# Patient Record
Sex: Female | Born: 1935 | Race: Black or African American | Hispanic: No | Smoking: Former smoker
Health system: Southern US, Community
[De-identification: ages and names within clinical notes are randomized; demographics above are authoritative.]

## PROBLEM LIST (undated history)

## (undated) DIAGNOSIS — E119 Type 2 diabetes mellitus without complications: Secondary | ICD-10-CM

## (undated) DIAGNOSIS — R0602 Shortness of breath: Secondary | ICD-10-CM

## (undated) DIAGNOSIS — Z9981 Dependence on supplemental oxygen: Secondary | ICD-10-CM

## (undated) DIAGNOSIS — F419 Anxiety disorder, unspecified: Secondary | ICD-10-CM

## (undated) DIAGNOSIS — C3412 Malignant neoplasm of upper lobe, left bronchus or lung: Secondary | ICD-10-CM

## (undated) DIAGNOSIS — J449 Chronic obstructive pulmonary disease, unspecified: Secondary | ICD-10-CM

## (undated) DIAGNOSIS — R7611 Nonspecific reaction to tuberculin skin test without active tuberculosis: Secondary | ICD-10-CM

## (undated) DIAGNOSIS — I10 Essential (primary) hypertension: Secondary | ICD-10-CM

## (undated) DIAGNOSIS — M1990 Unspecified osteoarthritis, unspecified site: Secondary | ICD-10-CM

## (undated) DIAGNOSIS — C801 Malignant (primary) neoplasm, unspecified: Secondary | ICD-10-CM

## (undated) DIAGNOSIS — J439 Emphysema, unspecified: Secondary | ICD-10-CM

## (undated) DIAGNOSIS — R002 Palpitations: Secondary | ICD-10-CM

## (undated) DIAGNOSIS — R0600 Dyspnea, unspecified: Secondary | ICD-10-CM

## (undated) DIAGNOSIS — K219 Gastro-esophageal reflux disease without esophagitis: Secondary | ICD-10-CM

## (undated) DIAGNOSIS — J309 Allergic rhinitis, unspecified: Secondary | ICD-10-CM

## (undated) HISTORY — DX: Essential (primary) hypertension: I10

## (undated) HISTORY — DX: Nonspecific reaction to tuberculin skin test without active tuberculosis: R76.11

## (undated) HISTORY — DX: Malignant neoplasm of upper lobe, left bronchus or lung: C34.12

## (undated) HISTORY — PX: CATARACT EXTRACTION W/ INTRAOCULAR LENS  IMPLANT, BILATERAL: SHX1307

## (undated) HISTORY — DX: Palpitations: R00.2

## (undated) HISTORY — DX: Allergic rhinitis, unspecified: J30.9

## (undated) HISTORY — DX: Chronic obstructive pulmonary disease, unspecified: J44.9

## (undated) HISTORY — DX: Shortness of breath: R06.02

## (undated) HISTORY — DX: Emphysema, unspecified: J43.9

---

## 1964-02-11 HISTORY — PX: VAGINAL HYSTERECTOMY: SHX2639

## 2009-01-15 ENCOUNTER — Emergency Department (HOSPITAL_COMMUNITY): Admission: EM | Admit: 2009-01-15 | Discharge: 2009-01-15 | Payer: Self-pay | Admitting: Emergency Medicine

## 2012-01-21 ENCOUNTER — Ambulatory Visit (HOSPITAL_COMMUNITY): Payer: Medicare Other | Attending: Cardiovascular Disease | Admitting: Radiology

## 2012-01-21 ENCOUNTER — Encounter: Payer: Medicare Other | Admitting: Cardiovascular Disease

## 2012-01-21 ENCOUNTER — Other Ambulatory Visit: Payer: Self-pay

## 2012-01-21 ENCOUNTER — Encounter: Payer: Self-pay | Admitting: *Deleted

## 2012-01-21 DIAGNOSIS — J4489 Other specified chronic obstructive pulmonary disease: Secondary | ICD-10-CM | POA: Insufficient documentation

## 2012-01-21 DIAGNOSIS — J449 Chronic obstructive pulmonary disease, unspecified: Secondary | ICD-10-CM | POA: Insufficient documentation

## 2012-01-21 DIAGNOSIS — I10 Essential (primary) hypertension: Secondary | ICD-10-CM | POA: Insufficient documentation

## 2012-01-21 DIAGNOSIS — Z8249 Family history of ischemic heart disease and other diseases of the circulatory system: Secondary | ICD-10-CM | POA: Insufficient documentation

## 2012-01-21 DIAGNOSIS — E669 Obesity, unspecified: Secondary | ICD-10-CM | POA: Insufficient documentation

## 2012-01-21 DIAGNOSIS — I517 Cardiomegaly: Secondary | ICD-10-CM

## 2012-01-21 DIAGNOSIS — E119 Type 2 diabetes mellitus without complications: Secondary | ICD-10-CM | POA: Insufficient documentation

## 2012-01-21 DIAGNOSIS — R002 Palpitations: Secondary | ICD-10-CM | POA: Insufficient documentation

## 2012-01-21 DIAGNOSIS — E785 Hyperlipidemia, unspecified: Secondary | ICD-10-CM | POA: Insufficient documentation

## 2012-01-21 DIAGNOSIS — F17200 Nicotine dependence, unspecified, uncomplicated: Secondary | ICD-10-CM | POA: Insufficient documentation

## 2012-01-21 NOTE — Progress Notes (Signed)
Echocardiogram performed.  

## 2012-01-21 NOTE — Progress Notes (Signed)
Patient ID: [name] [name], female   DOB: 07-Aug-1933, 78 y.o.   MRN: [id number] 78 yo referred by [facility].  Has HTN, DM elevated lipids intolerant to lipitor and COPD.  Increasing palpitations.  Already on both xanax and metoprolol.  ECG done at primary  11/23 NSR rate 73 normal.  Labs reivewed  And normal including Hct 38.6 Ambulatory monitor showed no arrhythmia  Some PAC;s wandering atrial pacer.  Maximum HR 119 mean HR 85 minimum HR 71.  ROS: Denies fever, malais, weight loss, blurry vision, decreased visual acuity, cough, sputum, SOB, hemoptysis, pleuritic pain, palpitaitons, heartburn, abdominal pain, melena, lower extremity edema, claudication, or rash.  All other systems reviewed and negative   General: Affect appropriate Healthy:  appears stated age HEENT: normal Neck supple with no adenopathy JVP normal no bruits no thyromegaly Lungs clear with no wheezing and good diaphragmatic motion Heart:  S1/S2 no murmur,rub, gallop or click PMI normal Abdomen: benighn, BS positve, no tenderness, no AAA no bruit.  No HSM or HJR Distal pulses intact with no bruits No edema Neuro non-focal Skin warm and dry No muscular weakness  Medications No current outpatient prescriptions on file.    Allergies Review of patient's allergies indicates not on file.  Family History: No family history on file.  Social History: History   Social History  . Marital Status: Single    Spouse Name: N/A    Number of Children: N/A  . Years of Education: N/A   Occupational History  . Not on file.   Social History Main Topics  . Smoking status: Not on file  . Smokeless tobacco: Not on file  . Alcohol Use: Not on file  . Drug Use: Not on file  . Sexually Active: Not on file   Other Topics Concern  . Not on file   Social History Narrative  . No narrative on file    Electrocardiogram:  11/23  SR rate 71 normal ECG  Assessment and Plan

## 2012-06-07 ENCOUNTER — Ambulatory Visit: Payer: Self-pay | Admitting: Family Medicine

## 2012-06-14 ENCOUNTER — Encounter: Payer: Self-pay | Admitting: Family Medicine

## 2012-06-14 ENCOUNTER — Ambulatory Visit (INDEPENDENT_AMBULATORY_CARE_PROVIDER_SITE_OTHER): Payer: Medicare Other | Admitting: Family Medicine

## 2012-06-14 VITALS — BP 135/94 | HR 71 | Temp 98.3°F | Resp 18 | Wt 203.0 lb

## 2012-06-14 DIAGNOSIS — M5430 Sciatica, unspecified side: Secondary | ICD-10-CM

## 2012-06-14 DIAGNOSIS — M7071 Other bursitis of hip, right hip: Secondary | ICD-10-CM

## 2012-06-14 DIAGNOSIS — M76899 Other specified enthesopathies of unspecified lower limb, excluding foot: Secondary | ICD-10-CM

## 2012-06-14 DIAGNOSIS — M5431 Sciatica, right side: Secondary | ICD-10-CM

## 2012-06-14 NOTE — Progress Notes (Signed)
  Subjective:    Patient ID: [name] [name], female    DOB: 07-09-1933, 79 y.o.   MRN: [id number]  HPI  Patient continued to have pain in her lower back that radiates into the posterior aspect of her right hip down the lateral aspect of her right thigh.  She reports numbness and weakness in her right thigh. She reports weakness in the thigh with walking. She reports severe pain in her lower back and posterior aspect of her right hip with prolonged standing and walking. There is no pain with range of motion of her hip. There is no crepitus in her hip. She does have tenderness to palpation over the right greater trochanteric bursa.  Pain improved 50% on neurontin 300 mg 3 times a day. Past Medical History  Diagnosis Date  . Palpitations   . SOB (shortness of breath)    No current outpatient prescriptions on file prior to visit.   No current facility-administered medications on file prior to visit.   Allergies  Allergen Reactions  . Darvon (Propoxyphene Hcl)   . Lipitor (Atorvastatin)     myalgia's     Review of Systems Remainder of review of systems is neg.    Objective:   Physical Exam  Cardiovascular: Normal rate, regular rhythm and normal heart sounds.   Pulmonary/Chest: Effort normal and breath sounds normal. No respiratory distress. She has no wheezes.  Musculoskeletal:       Right hip: She exhibits tenderness (over Greater trocanter). She exhibits normal range of motion, normal strength, no swelling and no crepitus.   pulses strength is 5 over 5 equal and symmetric in both legs. She is a negative straight leg raise today.        Assessment & Plan:  1. Right sided sciatica Continue Neurontin 300 mg 3 times a day. Obtain MRI of the lumbar spine to evaluate for possible causes of her right-sided neuropathy. - MR Lumbar Spine Wo Contrast; Future  2. Bursitis of right hip Evaluate #1 first, consider cortisone injection if no better.

## 2012-06-19 ENCOUNTER — Other Ambulatory Visit: Payer: Medicare Other

## 2012-06-21 ENCOUNTER — Inpatient Hospital Stay: Admission: RE | Admit: 2012-06-21 | Payer: Medicare Other | Source: Ambulatory Visit

## 2012-07-06 ENCOUNTER — Other Ambulatory Visit: Payer: Medicare Other

## 2012-07-09 ENCOUNTER — Inpatient Hospital Stay: Admission: RE | Admit: 2012-07-09 | Payer: Medicare Other | Source: Ambulatory Visit

## 2012-07-15 ENCOUNTER — Inpatient Hospital Stay: Admission: RE | Admit: 2012-07-15 | Payer: Medicare Other | Source: Ambulatory Visit

## 2012-07-26 ENCOUNTER — Other Ambulatory Visit: Payer: Medicare Other

## 2014-03-22 ENCOUNTER — Encounter: Payer: Self-pay | Admitting: Internal Medicine

## 2014-03-22 ENCOUNTER — Ambulatory Visit (INDEPENDENT_AMBULATORY_CARE_PROVIDER_SITE_OTHER): Payer: Medicare HMO | Admitting: Internal Medicine

## 2014-03-22 ENCOUNTER — Encounter (INDEPENDENT_AMBULATORY_CARE_PROVIDER_SITE_OTHER): Payer: Self-pay

## 2014-03-22 VITALS — BP 118/82 | HR 78 | Temp 98.0°F | Ht 62.0 in | Wt 180.8 lb

## 2014-03-22 DIAGNOSIS — J449 Chronic obstructive pulmonary disease, unspecified: Secondary | ICD-10-CM

## 2014-03-22 DIAGNOSIS — F17210 Nicotine dependence, cigarettes, uncomplicated: Secondary | ICD-10-CM | POA: Diagnosis not present

## 2014-03-22 DIAGNOSIS — R918 Other nonspecific abnormal finding of lung field: Secondary | ICD-10-CM | POA: Diagnosis not present

## 2014-03-22 DIAGNOSIS — R079 Chest pain, unspecified: Secondary | ICD-10-CM | POA: Insufficient documentation

## 2014-03-22 NOTE — Progress Notes (Signed)
   Subjective:    Patient ID: [name] [name], female    DOB: Aug 23, 1933,     MRN: [id number]  HPI  63 yowf active smoker referred by Dr Einar Gip for eval of lung nodule to pulmonary clinic and first seen 03/22/2014 with GOLD I criteria copd    03/22/2014 1st [facility] office visit/ [name] [name]   Chief Complaint  Patient presents with  . PULMONARY CONSULT    Referred by Dr Einar Gip for abn CT- possible malignancy.   breathing some better but first noticed maybe 10 years indolent onset progressive mild doe / R leg x 50 ft no longer does shopping unless riding cart New cough x nov 2015 with lots of white mucus, better p abx and prednisone but mucus never yellow/ green or bloody On no resp rx at all  No obvious other patterns in day to day or daytime variabilty or assoc  cp or chest tightness, subjective wheeze overt sinus or hb symptoms. No unusual exp hx or h/o childhood pna/ asthma or knowledge of premature birth.  Sleeping ok without nocturnal  or early am exacerbation  of respiratory  c/o's or need for noct saba. Also denies any obvious fluctuation of symptoms with weather or environmental changes or other aggravating or alleviating factors except as outlined above   Current Medications, Allergies, Complete Past Medical History, Past Surgical History, Family History, and Social History were reviewed in [employer] record.             Review of Systems  Constitutional: Negative for fever and unexpected weight change.  HENT: Positive for congestion, postnasal drip, rhinorrhea and sinus pressure. Negative for dental problem, ear pain, nosebleeds, sneezing, sore throat and trouble swallowing.   Eyes: Negative for redness and itching.  Respiratory: Positive for cough, shortness of breath and wheezing. Negative for chest tightness.   Cardiovascular: Positive for palpitations. Negative for leg swelling.       Poor circulation  Gastrointestinal: Negative for nausea and  vomiting.  Genitourinary: Negative for dysuria.  Musculoskeletal: Negative for joint swelling.  Skin: Negative for rash.  Neurological: Negative for headaches.  Hematological: Bruises/bleeds easily.  Psychiatric/Behavioral: Negative for dysphoric mood. The patient is nervous/anxious.        Objective:   Physical Exam  Obese bf nad   Wt Readings from Last 3 Encounters:  03/22/14 180 lb 12.8 oz (82.01 kg)  06/14/12 203 lb (92.08 kg)    Vital signs reviewed  HEENT: nl dentition, turbinates, and orophanx. Nl external ear canals without cough reflex   NECK :  without JVD/Nodes/TM/ nl carotid upstrokes bilaterally   LUNGS: no acc muscle use, clear to A and P bilaterally without cough on insp or exp maneuvers   CV:  RRR  no s3 or murmur or increase in P2, no edema   ABD:  soft and nontender with nl excursion in the supine position. No bruits or organomegaly, bowel sounds nl  MS:  warm without deformities, calf tenderness, cyanosis or clubbing  SKIN: warm and dry without lesions    NEURO:  alert, approp, no deficits        CT chest 02/24/14 reviewed: LUL dense pleural based mass       Assessment & Plan:

## 2014-03-22 NOTE — Patient Instructions (Addendum)
Please see patient coordinator before you leave today  to schedule PET scan and follow up with Dr Roxan Hockey a day or so later   No smoking if at all possible between now and surgery - it's the most important aspect of your care!

## 2014-03-23 DIAGNOSIS — F17210 Nicotine dependence, cigarettes, uncomplicated: Secondary | ICD-10-CM | POA: Insufficient documentation

## 2014-03-23 NOTE — Assessment & Plan Note (Signed)

## 2014-03-23 NOTE — Assessment & Plan Note (Signed)
CT 02/24/14 c/w resectable LUL tumor  Discussed in detail all the  indications, usual  risks and alternatives  relative to the benefits with patient who agrees to proceed with PET first and if resectable for cure rec T surgery eval rather than attempt IR bx first

## 2014-03-23 NOTE — Assessment & Plan Note (Signed)
-   spirometry 03/22/2014  FEV1  1.57 (102%) ratio 57%     I reviewed the Flethcher curve with patient that basically indicates  if you quit smoking when your best day FEV1 is still well preserved (as is the case here)  it is highly unlikely you will progress to severe disease and informed the patient there was no medication on the market that has proven to change the curve or the likelihood of progression.  Therefore stopping smoking and maintaining abstinence is the most important aspect of care, not choice of inhalers or for that matter, doctors.  No need for resp rx at this point other than to stop smoking

## 2014-03-29 ENCOUNTER — Encounter (HOSPITAL_COMMUNITY)
Admission: RE | Admit: 2014-03-29 | Discharge: 2014-03-29 | Disposition: A | Discharge status: Home / Self Care | Payer: Medicare HMO | Source: Ambulatory Visit | Attending: Internal Medicine | Admitting: Internal Medicine

## 2014-03-29 DIAGNOSIS — R918 Other nonspecific abnormal finding of lung field: Secondary | ICD-10-CM | POA: Diagnosis not present

## 2014-03-29 LAB — GLUCOSE, CAPILLARY: Glucose-Capillary: 103 mg/dL — ABNORMAL HIGH (ref 70–99)

## 2014-03-29 MED ORDER — FLUDEOXYGLUCOSE F - 18 (FDG) INJECTION
9.7000 | Freq: Once | INTRAVENOUS | Status: AC | PRN
  Administered 2014-03-29: 9.7 via INTRAVENOUS

## 2014-03-30 ENCOUNTER — Telehealth: Payer: Self-pay | Admitting: Internal Medicine

## 2014-03-30 NOTE — Telephone Encounter (Signed)
Called and spoke with pt and about her PET scan results per MW>  Pt requesting that MW call her directly since she has questions to ask.  MW please advise. thanks

## 2014-03-31 ENCOUNTER — Encounter: Payer: Self-pay | Admitting: Thoracic Surgery (Cardiothoracic Vascular Surgery)

## 2014-03-31 ENCOUNTER — Other Ambulatory Visit: Payer: Self-pay | Admitting: *Deleted

## 2014-03-31 ENCOUNTER — Institutional Professional Consult (permissible substitution) (INDEPENDENT_AMBULATORY_CARE_PROVIDER_SITE_OTHER): Payer: Medicare HMO | Admitting: Thoracic Surgery (Cardiothoracic Vascular Surgery)

## 2014-03-31 VITALS — BP 158/90 | HR 107 | Resp 16 | Ht 62.5 in | Wt 181.0 lb

## 2014-03-31 DIAGNOSIS — R918 Other nonspecific abnormal finding of lung field: Secondary | ICD-10-CM

## 2014-03-31 DIAGNOSIS — E119 Type 2 diabetes mellitus without complications: Secondary | ICD-10-CM | POA: Insufficient documentation

## 2014-03-31 DIAGNOSIS — I739 Peripheral vascular disease, unspecified: Secondary | ICD-10-CM

## 2014-03-31 DIAGNOSIS — R0989 Other specified symptoms and signs involving the circulatory and respiratory systems: Secondary | ICD-10-CM

## 2014-03-31 DIAGNOSIS — J432 Centrilobular emphysema: Secondary | ICD-10-CM

## 2014-03-31 NOTE — Progress Notes (Signed)
PCP is No PCP Per Patient Referring Provider is Tanda Rockers, MD  Chief Complaint  Patient presents with  . Lung Mass    LULobe per PET    HPI: [name] [name] is a 81 year old woman who is sent for consultation regarding a left upper lobe mass.  [name] [name] is a 81 year old woman who has been in generally good health most of her life. She is a very poor historian. She has a long history of tobacco abuse having started smoking at age 23. She smoked just less than a pack a day for her adult life. She still smokes less than half a pack of cigarettes daily.  She recently saw Dr. Einar Gip apparently for workup of shortness of breath with exertion. A chest x-ray showed a lung mass. That led to a CT scan which showed a 4 cm left upper lobe mass as well as extensive emphysematous changes in the upper lobes bilaterally. There is no obvious adenopathy.  She was referred to Dr. Melvyn Novas. He recommended a PET/CT. The primary mass was markedly hypermetabolic with an SUV of 27. It was adjacent to the chest wall but no definite evidence of invasion. There was no evidence of nodal involvement or distant metastases.  She says that she was having a lot of breathing issues back in November. She had a cough and was treated with antibiotics. She says that she now does not short of breath with walking. She walks up a flight of stairs to get into her apartment. She says that her activities are mostly limited by right hip pain and back pain. She says that Dr. Einar Gip found that she has peripheral arterial disease.  She is on metformin. She says that she has been told that she has prediabetes. She denies chest pain, pressure or tightness when asked directly about them. She has had a cough recently. She has not had any hemoptysis. She says her appetite is been poor and she's lost about 20 pounds over the past 3 months. She's not had any unusual headaches. She recently had cataract surgery and has had some blurred vision since. She  denies wheezing and says she has not had to use any inhalers.  Zubrod Score: At the time of surgery this patient's most appropriate activity status/level should be described as: []     0    Normal activity, no symptoms []     1    Restricted in physical strenuous activity but ambulatory, able to do out light work [x]     2    Ambulatory and capable of self care, unable to do work activities, up and about >50 % of waking hours                              []     3    Only limited self care, in bed greater than 50% of waking hours []     4    Completely disabled, no self care, confined to bed or chair []     5    Moribund   Past Medical History  Diagnosis Date  . Palpitations   . SOB (shortness of breath)   . COPD (chronic obstructive pulmonary disease)   . HTN (hypertension)    diabetes mellitus type 2  Past Surgical History  Procedure Laterality Date  . Vaginal hysterectomy  1966    Family History  Problem Relation Age of Onset  . Cervical cancer Mother   .  Prostate cancer Father   . Stroke Maternal Grandmother   . Angina Maternal Grandfather   . Prostate cancer Paternal Uncle     Social History History  Substance Use Topics  . Smoking status: Current Every Day Smoker -- 0.75 packs/day for 69 years    Types: Cigarettes  . Smokeless tobacco: Not on file     Comment: Pt is trying to quit  . Alcohol Use: No    Current Outpatient Prescriptions  Medication Sig Dispense Refill  . ALPRAZolam (XANAX) 0.5 MG tablet Take 0.5 mg by mouth 3 (three) times daily as needed for anxiety.    Marland Kitchen aspirin 81 MG tablet Take 81 mg by mouth daily.    Marland Kitchen atorvastatin (LIPITOR) 10 MG tablet Take 10 mg by mouth daily.    Marland Kitchen buPROPion (WELLBUTRIN SR) 150 MG 12 hr tablet Take 150 mg by mouth every 12 (twelve) hours.    . metFORMIN (GLUCOPHAGE) 500 MG tablet Take 500 mg by mouth 2 (two) times daily with a meal.    . metoprolol succinate (TOPROL-XL) 50 MG 24 hr tablet Take 50 mg by mouth 2 (two) times  daily. Take with or immediately following a meal.    . valsartan-hydrochlorothiazide (DIOVAN-HCT) 160-25 MG per tablet Take 1 tablet by mouth daily.     No current facility-administered medications for this visit.    Allergies  Allergen Reactions  . Darvon [Propoxyphene Hcl]   . Lipitor [Atorvastatin]     myalgia's    Review of Systems  Constitutional: Positive for activity change (very limited- hip and leg pain), appetite change and unexpected weight change (lost 20 pounds in 3 months).  Eyes: Positive for visual disturbance (recent cataract surgery).  Respiratory: Positive for shortness of breath (With exertion, although exertion more of the limited by leg pain).   Cardiovascular: Negative for chest pain.       Poor circulation  Musculoskeletal: Positive for back pain and arthralgias.       Leg cramps  Neurological: Negative.   Hematological: Bruises/bleeds easily.  Psychiatric/Behavioral: The patient is nervous/anxious.   All other systems reviewed and are negative.   BP 158/90 mmHg  Pulse 107  Resp 16  Ht 5' 2.5" (1.588 m)  Wt 181 lb (82.101 kg)  BMI 32.56 kg/m2  SpO2 97% Physical Exam  Constitutional: She is oriented to person, place, and time. No distress.  Obese  HENT:  Head: Normocephalic and atraumatic.  Eyes: EOM are normal. Pupils are equal, round, and reactive to light.  Neck: Normal range of motion. Neck supple. No thyromegaly present.  Bilateral carotid bruits left greater than right  Cardiovascular: Normal rate, regular rhythm and normal heart sounds.   No murmur heard. Pulmonary/Chest: Effort normal. She has no wheezes.  Diminished breath sounds bilaterally  Abdominal: Soft. There is no tenderness.  Musculoskeletal: She exhibits no edema.  Lymphadenopathy:    She has no cervical adenopathy.  Neurological: She is alert and oriented to person, place, and time. No cranial nerve deficit.  No focal motor deficits  Skin: Skin is warm and dry.  Vitals  reviewed.    Diagnostic Tests: NUCLEAR MEDICINE PET SKULL BASE TO THIGH  TECHNIQUE: 9.7 mCi F-18 FDG was injected intravenously. Full-ring PET imaging was performed from the skull base to thigh after the radiotracer. CT data was obtained and used for attenuation correction and anatomic localization.  FASTING BLOOD GLUCOSE: Value: 103 mg/dl  COMPARISON: None.  FINDINGS: NECK  No hypermetabolic lymph nodes in the neck.  CHEST  Hypermetabolic mass in the left upper lobe measures 4.5 x 3.9 cm. Max abuts the pleural surface. This left upper lobe mass is intensely metabolic with SUV max equal 26.7.  No no hypermetabolic mediastinal lymph nodes. AP window node measuring 9 mm does not have metabolic activity above background.  In the right upper lobe, small nodule along the pleural surface measures 7 mm (image 49, series 4). No clear associated metabolic activity.  ABDOMEN/PELVIS  No abnormal hypermetabolic activity within the liver, pancreas, adrenal glands, or spleen. No hypermetabolic lymph nodes in the abdomen or pelvis. Severe atherosclerotic calcification abdominal aorta. Diverticulosis of the colon without diverticulitis.  SKELETON  No focal hypermetabolic activity to suggest skeletal metastasis.  IMPRESSION: 1. Hypermetabolic left upper lobe mass abutting the pleural surface. 2. No clear hypermetabolic mediastinal lymph nodes. 3. Staging by FDG PET imaging T2a N0 M0 4. Subpleural nodule in the right upper lobe measures 7 mm. Recommend attention on follow-up. 5. Extensive centrilobular emphysema.   Electronically Signed  By: Suzy Bouchard M.D.  On: 03/29/2014 16:03  I personally reviewed the CT from [facility] and the PET CT. My recommendations are based on my interpretation.  Pulmonary function tests FVC 2.77 (132%) FEV1 1.57 (102%) FEV1/FVC 57% Vital Capacity 2.09  Impression: 81 year old woman with a long history of  tobacco abuse who has a 4 cm spiculated nodule in the left upper lobe that is highly hypermetabolic on PET/CT. This is consistent with a primary bronchogenic carcinoma. There is no evidence of regional or distant metastases. Lesion is adjacent to the chest wall, but there is no radiologic evidence of chest wall invasion.  I had a long discussion with [name] [name] and her daughter. I reviewed the CT and PET/CT findings with them. We discussed the differential diagnosis and options for diagnosis and treatment. They understand that this has to be considered a lung cancer was secured be proven otherwise. They understand that this is extremely unlikely to be anything else. We discussed potential treatment options such as surgery, chemotherapy, radiation, and combinations.  It was very difficult to get an accurate assessment of her exercise capacity and breathing capacity. She has very significant emphysematous changes on her CT, but her pulmonary function testing was relatively unremarkable. From her history she sounded very deconditioned. I will did a walk around the office floor with her in going several hundred feet and she did not have any difficulty with shortness of breath. She did complain of some right hip pain.  I discussed possible surgical resection. They understand that this has the best chance for cure. They do understand this would be a major operation with significant risk for morbidity and mortality given her advanced age and some of her comorbid conditions. The proposed procedure with the left video-assisted thoracoscopy, left upper lobectomy, possible chest wall resection, possible cryo-analgesia of intercostal nerves. I described the general nature of the procedure to them including the incisions to be used, the need for general anesthesia, the expected hospital stay, and the overall recovery. She does understand that her breathing will be worse after the operation better. We reviewed the  indications, risks, benefits, and alternatives. They understand the risk include, but are not limited to death, MI, DVT, PE, bleeding, possible need for transfusion, infection, prolonged air leaks, cardiac arrhythmias, as well as the possibility of unforeseeable complications.  They wish to take some time and think over the options before making a decision. They will return in 2 weeks for further discussion. In  the meantime we will get an MRI of the brain to rule out metastases.  Plan:  1. MR brain- new lung mass, rule out metastases  2. Return in 2 weeks for further discussion. If she decides to proceed before that time she can call and we'll get the operation scheduled.  Revonda Standard Roxan Hockey, MD [facility] [phone]

## 2014-04-11 LAB — BUN: BUN: 13 mg/dL (ref 6–23)

## 2014-04-11 LAB — CREATININE, SERUM: CREATININE: 1 mg/dL (ref 0.50–1.10)

## 2014-04-17 ENCOUNTER — Ambulatory Visit
Admission: RE | Admit: 2014-04-17 | Discharge: 2014-04-17 | Disposition: A | Discharge status: Home / Self Care | Payer: Medicare HMO | Source: Ambulatory Visit | Attending: Thoracic Surgery (Cardiothoracic Vascular Surgery) | Admitting: Thoracic Surgery (Cardiothoracic Vascular Surgery)

## 2014-04-17 DIAGNOSIS — R918 Other nonspecific abnormal finding of lung field: Secondary | ICD-10-CM

## 2014-04-17 MED ORDER — GADOBENATE DIMEGLUMINE 529 MG/ML IV SOLN
17.0000 mL | Freq: Once | INTRAVENOUS | Status: AC | PRN
  Administered 2014-04-17: 17 mL via INTRAVENOUS

## 2014-04-18 ENCOUNTER — Encounter: Payer: Self-pay | Admitting: Thoracic Surgery (Cardiothoracic Vascular Surgery)

## 2014-04-18 ENCOUNTER — Other Ambulatory Visit: Payer: Self-pay | Admitting: *Deleted

## 2014-04-18 ENCOUNTER — Ambulatory Visit (INDEPENDENT_AMBULATORY_CARE_PROVIDER_SITE_OTHER): Payer: Medicare HMO | Admitting: Thoracic Surgery (Cardiothoracic Vascular Surgery)

## 2014-04-18 VITALS — BP 175/87 | HR 88 | Resp 20 | Ht 62.0 in | Wt 180.0 lb

## 2014-04-18 DIAGNOSIS — J984 Other disorders of lung: Secondary | ICD-10-CM

## 2014-04-18 DIAGNOSIS — R918 Other nonspecific abnormal finding of lung field: Secondary | ICD-10-CM

## 2014-04-18 DIAGNOSIS — J432 Centrilobular emphysema: Secondary | ICD-10-CM

## 2014-04-18 NOTE — Progress Notes (Signed)
HPI:  [name] [name] is a 81 year old woman with a left upper lobe mass was on the office 2 weeks ago. She was uncertain as to how she would like to proceed with treatment. She had a brain MR and now returns to discuss the results of that study as well as decide how to proceed with treatment of her lung mass.  She has been very anxious about her results.  She is a poor historian and complained of difficulty walking. It was unclear if this was due to hip pain or shortness of breath. I walked with her around the upper level of our office building to the elevators and back. She did not have any shortness of breath. She did complain of hip pain.   I reviewed her notes from Dr. Irven Shelling office. Her echocardiogram showed left ventricular hypertrophy with grade 1 diastolic dysfunction. Ejection fraction was 57%. There was trace tricuspid regurgitation, no evidence of pulmonary hypertension. ABIs were 0.45 on the right and 0.89 on the left. Her nuclear stress study was nondiagnostic for ischemia. There was prominent breast attenuation artifact in the inferior wall without demonstrable ischemia. This was interpreted as a low risk study  03/31/2014 [name] [name] is a 81 year old woman who has been in generally good health most of her life. She is a very poor historian. She has a long history of tobacco abuse having started smoking at age 44. She smoked just less than a pack a day for her adult life. She still smokes less than half a pack of cigarettes daily.  She recently saw Dr. Einar Gip apparently for workup of shortness of breath with exertion. A chest x-ray showed a lung mass. That led to a CT scan which showed a 4 cm left upper lobe mass as well as extensive emphysematous changes in the upper lobes bilaterally. There was no obvious adenopathy.  She was referred to Dr. Melvyn Novas. He recommended a PET/CT. The primary mass was markedly hypermetabolic with an SUV of 27. It was adjacent to the chest wall but no definite  evidence of invasion. There was no evidence of nodal involvement or distant metastases.  She says that she was having a lot of breathing issues back in November. She had a cough and was treated with antibiotics. She says that she now does not short of breath with walking. She walks up a flight of stairs to get into her apartment. She says that her activities are mostly limited by right hip pain and back pain. She says that Dr. Einar Gip found that she has peripheral arterial disease.  She is on metformin for "prediabetes". She denies chest pain, pressure or tightness when asked directly about them. She has had a cough recently. She has not had any hemoptysis. She says her appetite is been poor and she's lost about 20 pounds over the past 3 months. She's not had any unusual headaches. She recently had cataract surgery and has had some blurred vision since. She denies wheezing and says she has not had to use any inhalers.  Past Medical History  Diagnosis Date  . Palpitations   . SOB (shortness of breath)   . COPD (chronic obstructive pulmonary disease)   . HTN (hypertension)    Past Surgical History  Procedure Laterality Date  . Vaginal hysterectomy  1966    Current Outpatient Prescriptions  Medication Sig Dispense Refill  . ALPRAZolam (XANAX) 0.5 MG tablet Take 0.5 mg by mouth 3 (three) times daily as needed for anxiety.    Marland Kitchen  aspirin 81 MG tablet Take 81 mg by mouth daily.    Marland Kitchen atorvastatin (LIPITOR) 10 MG tablet Take 10 mg by mouth daily.    Marland Kitchen buPROPion (WELLBUTRIN SR) 150 MG 12 hr tablet Take 150 mg by mouth every 12 (twelve) hours.    . metFORMIN (GLUCOPHAGE) 500 MG tablet Take 500 mg by mouth 2 (two) times daily with a meal.    . metoprolol succinate (TOPROL-XL) 50 MG 24 hr tablet Take 50 mg by mouth 2 (two) times daily. Take with or immediately following a meal.    . valsartan-hydrochlorothiazide (DIOVAN-HCT) 160-25 MG per tablet Take 1 tablet by mouth daily.     No current  facility-administered medications for this visit.   Family History  Problem Relation Age of Onset  . Cervical cancer Mother   . Prostate cancer Father   . Stroke Maternal Grandmother   . Angina Maternal Grandfather   . Prostate cancer Paternal Uncle    History   Social History  . Marital Status: Single    Spouse Name: N/A  . Number of Children: N/A  . Years of Education: N/A   Occupational History  . retired    Social History Main Topics  . Smoking status: Current Every Day Smoker -- 0.75 packs/day for 69 years    Types: Cigarettes  . Smokeless tobacco: Not on file     Comment: Pt is trying to quit  . Alcohol Use: No  . Drug Use: No  . Sexual Activity: Not on file   Other Topics Concern  . Not on file   Social History Narrative     Physical Exam  BP 175/87 mmHg  Pulse 88  Resp 20  Ht 5\' 2"  (1.575 m)  Wt 180 lb (81.647 kg)  BMI 32.91 kg/m2  SpO2 96%  Constitutional: She is oriented to person, place, and time. No distress.  Obese  HENT:  Head: Normocephalic and atraumatic.  Eyes: EOM are normal. Pupils are equal, round, and reactive to light.  Neck: Normal range of motion. Neck supple. No thyromegaly present.  Bilateral carotid bruits left greater than right  Cardiovascular: Normal rate, regular rhythm and normal heart sounds.  No murmur heard. Pulmonary/Chest: Effort normal. She has no wheezes.  Diminished breath sounds bilaterally  Abdominal: Soft. There is no tenderness.  Musculoskeletal: She exhibits no edema.  Lymphadenopathy:   She has no cervical adenopathy.  Neurological: She is alert and oriented to person, place, and time. No cranial nerve deficit.  No focal motor deficits  Skin: Skin is warm and dry.  Vitals reviewed.    Diagnostic Tests: NUCLEAR MEDICINE PET SKULL BASE TO THIGH  TECHNIQUE: 9.7 mCi F-18 FDG was injected intravenously. Full-ring PET imaging was performed from the skull base to thigh after the radiotracer. CT data  was obtained and used for attenuation correction and anatomic localization.  FASTING BLOOD GLUCOSE: Value: 103 mg/dl  COMPARISON: None.  FINDINGS: NECK  No hypermetabolic lymph nodes in the neck.  CHEST  Hypermetabolic mass in the left upper lobe measures 4.5 x 3.9 cm. Max abuts the pleural surface. This left upper lobe mass is intensely metabolic with SUV max equal 26.7.  No no hypermetabolic mediastinal lymph nodes. AP window node measuring 9 mm does not have metabolic activity above background.  In the right upper lobe, small nodule along the pleural surface measures 7 mm (image 49, series 4). No clear associated metabolic activity.  ABDOMEN/PELVIS  No abnormal hypermetabolic activity within the liver, pancreas,  adrenal glands, or spleen. No hypermetabolic lymph nodes in the abdomen or pelvis. Severe atherosclerotic calcification abdominal aorta. Diverticulosis of the colon without diverticulitis.  SKELETON  No focal hypermetabolic activity to suggest skeletal metastasis.  IMPRESSION: 1. Hypermetabolic left upper lobe mass abutting the pleural surface. 2. No clear hypermetabolic mediastinal lymph nodes. 3. Staging by FDG PET imaging T2a N0 M0 4. Subpleural nodule in the right upper lobe measures 7 mm. Recommend attention on follow-up. 5. Extensive centrilobular emphysema.   Electronically Signed  By: Suzy Bouchard M.D.  On: 03/29/2014 16:03  I personally reviewed the CT from [facility] and the PET CT. My recommendations are based on my interpretation.  Pulmonary function tests FVC 2.77 (132%) FEV1 1.57 (102%) FEV1/FVC 57% Vital Capacity 2.09  I reviewed her notes from Dr. Irven Shelling office. Her echocardiogram showed left ventricular hypertrophy with grade 1 diastolic dysfunction. Ejection fraction was 57%. There was trace tricuspid regurgitation, no evidence of pulmonary hypertension. ABIs were 0.45 on the right and 0.89 on the left.  Her nuclear stress study was nondiagnostic for ischemia. There was prominent breast attenuation artifact in the inferior wall without demonstrable ischemia. This was interpreted as a low risk study  Impression: [name] [name] is a 81 year old smoker with COPD who has a 4 cm left upper lobe mass abutting the chest wall. This is highly suspicious for a primary bronchogenic carcinoma. It has to be considered a lung cancer unless it can be proven otherwise. My recommendation was to proceed with surgical resection. She is a relatively high risk patient given her age, obesity, peripheral arterial disease, diabetes, and COPD. We did discuss the alternative option of biopsy followed by chemotherapy and radiation. She does understand that depending on intraoperative findings we may recommend chemotherapy and/or radiation postoperatively.  She understands that surgery is not a guarantee of cure, but offers the best hope for that.  She was accompanied by her daughter, who was here at her last visit, and her son-in-law.  I once again reviewed the general nature of the proposed surgical procedure. We again discussed the incisions to be used, the need for general anesthesia, the expected hospital stay, and overall recovery. We reviewed the indications, risks, benefits, and alternatives. She understands the risks include, but are not limited to death, MI, DVT, PE, bleeding, possible need for transfusion, stroke, infection, air leak, cardiac arrhythmias, as well as the possibility of unforeseeable complications.  We also discussed the use of prior analgesia of intercostal nerves to help with postoperative pain. She does wish to have that done. She does understand that would depend on whether or not there is chest wall invasion.  She has decided to proceed with surgery.  Plan: Left VATS, left upper lobectomy, cryo-analgesia of intercostal nerves on Thursday, 05/04/2014  [name], MD [facility] [phone]

## 2014-05-02 ENCOUNTER — Ambulatory Visit (HOSPITAL_COMMUNITY)
Admission: RE | Admit: 2014-05-02 | Discharge: 2014-05-02 | Disposition: A | Discharge status: Home / Self Care | Payer: Medicare HMO | Source: Ambulatory Visit | Attending: Thoracic Surgery (Cardiothoracic Vascular Surgery) | Admitting: Thoracic Surgery (Cardiothoracic Vascular Surgery)

## 2014-05-02 ENCOUNTER — Encounter (HOSPITAL_COMMUNITY)
Admission: RE | Admit: 2014-05-02 | Discharge: 2014-05-02 | Disposition: A | Discharge status: Home / Self Care | Payer: Medicare HMO | Source: Ambulatory Visit | Attending: Thoracic Surgery (Cardiothoracic Vascular Surgery) | Admitting: Thoracic Surgery (Cardiothoracic Vascular Surgery)

## 2014-05-02 ENCOUNTER — Encounter (HOSPITAL_COMMUNITY): Payer: Self-pay

## 2014-05-02 VITALS — BP 141/69 | HR 77 | Temp 98.6°F | Resp 20

## 2014-05-02 DIAGNOSIS — Z720 Tobacco use: Secondary | ICD-10-CM | POA: Insufficient documentation

## 2014-05-02 DIAGNOSIS — R05 Cough: Secondary | ICD-10-CM

## 2014-05-02 DIAGNOSIS — R918 Other nonspecific abnormal finding of lung field: Secondary | ICD-10-CM | POA: Insufficient documentation

## 2014-05-02 DIAGNOSIS — Z01818 Encounter for other preprocedural examination: Secondary | ICD-10-CM

## 2014-05-02 DIAGNOSIS — J984 Other disorders of lung: Secondary | ICD-10-CM

## 2014-05-02 HISTORY — DX: Unspecified osteoarthritis, unspecified site: M19.90

## 2014-05-02 HISTORY — DX: Anxiety disorder, unspecified: F41.9

## 2014-05-02 HISTORY — DX: Type 2 diabetes mellitus without complications: E11.9

## 2014-05-02 HISTORY — DX: Malignant (primary) neoplasm, unspecified: C80.1

## 2014-05-02 LAB — BLOOD GAS, ARTERIAL
Acid-base deficit: 1.3 mmol/L (ref 0.0–2.0)
Bicarbonate: 22.5 mEq/L (ref 20.0–24.0)
DRAWN BY: 206361
FIO2: 0.21 %
O2 Saturation: 93.2 %
PATIENT TEMPERATURE: 98.6
PCO2 ART: 35.2 mmHg (ref 35.0–45.0)
PH ART: 7.421 (ref 7.350–7.450)
TCO2: 23.6 mmol/L (ref 0–100)
pO2, Arterial: 68.8 mmHg — ABNORMAL LOW (ref 80.0–100.0)

## 2014-05-02 LAB — COMPREHENSIVE METABOLIC PANEL
ALK PHOS: 89 U/L (ref 39–117)
ALT: 13 U/L (ref 0–35)
ANION GAP: 10 (ref 5–15)
AST: 16 U/L (ref 0–37)
Albumin: 3.2 g/dL — ABNORMAL LOW (ref 3.5–5.2)
BILIRUBIN TOTAL: 0.4 mg/dL (ref 0.3–1.2)
BUN: 11 mg/dL (ref 6–23)
CHLORIDE: 108 mmol/L (ref 96–112)
CO2: 21 mmol/L (ref 19–32)
Calcium: 9.7 mg/dL (ref 8.4–10.5)
Creatinine, Ser: 0.8 mg/dL (ref 0.50–1.10)
GFR calc Af Amer: 80 mL/min — ABNORMAL LOW (ref >90)
GFR calc non Af Amer: 69 mL/min — ABNORMAL LOW (ref >90)
Glucose, Bld: 99 mg/dL (ref 70–99)
Potassium: 4.3 mmol/L (ref 3.5–5.1)
Sodium: 139 mmol/L (ref 135–145)
Total Protein: 7.2 g/dL (ref 6.0–8.3)

## 2014-05-02 LAB — URINALYSIS, ROUTINE W REFLEX MICROSCOPIC
BILIRUBIN URINE: NEGATIVE
Glucose, UA: NEGATIVE mg/dL
HGB URINE DIPSTICK: NEGATIVE
KETONES UR: NEGATIVE mg/dL
Nitrite: NEGATIVE
PROTEIN: NEGATIVE mg/dL
Specific Gravity, Urine: 1.011 (ref 1.005–1.030)
UROBILINOGEN UA: 0.2 mg/dL (ref 0.0–1.0)
pH: 5.5 (ref 5.0–8.0)

## 2014-05-02 LAB — ABO/RH: ABO/RH(D): O POS

## 2014-05-02 LAB — CBC
HCT: 35.2 % — ABNORMAL LOW (ref 36.0–46.0)
HEMOGLOBIN: 11.8 g/dL — AB (ref 12.0–15.0)
MCH: 27.8 pg (ref 26.0–34.0)
MCHC: 33.5 g/dL (ref 30.0–36.0)
MCV: 83 fL (ref 78.0–100.0)
PLATELETS: 375 10*3/uL (ref 150–400)
RBC: 4.24 MIL/uL (ref 3.87–5.11)
RDW: 14 % (ref 11.5–15.5)
WBC: 12.2 10*3/uL — ABNORMAL HIGH (ref 4.0–10.5)

## 2014-05-02 LAB — SURGICAL PCR SCREEN
MRSA, PCR: NEGATIVE
STAPHYLOCOCCUS AUREUS: NEGATIVE

## 2014-05-02 LAB — PROTIME-INR
INR: 1.01 (ref 0.00–1.49)
Prothrombin Time: 13.4 seconds (ref 11.6–15.2)

## 2014-05-02 LAB — URINE MICROSCOPIC-ADD ON

## 2014-05-02 LAB — APTT: aPTT: 37 seconds (ref 24–37)

## 2014-05-02 NOTE — Progress Notes (Signed)
States that she does not have a PCP she uses urgent care if needed. Cardiologist is Dr Einar Gip States that she had a stress test and echo done in Jan 2016. Request sent for last office visit, and any heart studies.  Reports that she does not check her blood sugars at home, last time she was tested it was 167. (Unsure if this is fasting or not)

## 2014-05-02 NOTE — Pre-Procedure Instructions (Signed)
[name] [name]  05/02/2014   Your procedure is scheduled on:  March 24  Report to [facility] at [phone].  Call this number if you have problems the morning of surgery: [phone]   Remember:   Do not eat food or drink liquids after midnight.   Take these medicines the morning of surgery with A SIP OF WATER: Alprazolam (Xanax) if needed, Symbicort if needed , Bupropion (Wellbutri SR), Advair if needed, Metoprolol (Lopressor),  Bring your inhalers with you on day of surgery  Do not take your metformin (Glucophage) the Morning of surgery Stop taking Aspirin, Anaprox, Aleve, Ibuprofen, BC's, Goody's, Herbal medications, Fish Oil  Do not wear jewelry, make-up or nail polish.  Do not wear lotions, powders, or perfumes. You may wear deodorant.  Do not shave 48 hours prior to surgery. Men may shave face and neck.  Do not bring valuables to the hospital.  [facility] is not responsible for any belongings or valuables.               Contacts, dentures or bridgework may not be worn into surgery.  Leave suitcase in the car. After surgery it may be brought to your room.  For patients admitted to the hospital, discharge time is determined by your treatment team.               Patients discharged the day of surgery will not be allowed to drive home.    Special Instructions: [facility] - Preparing for Surgery  Before surgery, you can play an important role.  Because skin is not sterile, your skin needs to be as free of germs as possible.  You can reduce the number of germs on you skin by washing with CHG (chlorahexidine gluconate) soap before surgery.  CHG is an antiseptic cleaner which kills germs and bonds with the skin to continue killing germs even after washing.  Please DO NOT use if you have an allergy to CHG or antibacterial soaps.  If your skin becomes reddened/irritated stop using the CHG and inform your nurse when you arrive at Short Stay.  Do not shave (including legs  and underarms) for at least 48 hours prior to the first CHG shower.  You may shave your face.  Please follow these instructions carefully:   1.  Shower with CHG Soap the night before surgery and the   morning of Surgery.  2.  If you choose to wash your hair, wash your hair first as usual with your  normal shampoo.  3.  After you shampoo, rinse your hair and body thoroughly to remove the Shampoo.  4.  Use CHG as you would any other liquid soap.  You can apply chg directly  to the skin and wash gently with scrungie or a clean washcloth.  5.  Apply the CHG Soap to your body ONLY FROM THE NECK DOWN.      Do not use on open wounds or open sores.  Avoid contact with your eyes,   ears, mouth and genitals (private parts).  Wash genitals (private parts)       with your normal soap.  6.  Wash thoroughly, paying special attention to the area where your surgery  will be performed.  7.  Thoroughly rinse your body with warm water from the neck down.  8.  DO NOT shower/wash with your normal soap after using and rinsing off  the CHG Soap.  9.  Pat yourself dry with a clean  towel.            10.  Wear clean pajamas.            11.  Place clean sheets on your bed the night of your first shower and do not sleep with pets.  Day of Surgery  Do not apply any lotions/deoderants the morning of surgery.  Please wear clean clothes to the hospital/surgery center.      Please read over the following fact sheets that you were given: Pain Booklet, Coughing and Deep Breathing, Blood Transfusion Information, MRSA Information and Surgical Site Infection Prevention

## 2014-05-03 MED ORDER — DEXTROSE 5 % IV SOLN
1.5000 g | INTRAVENOUS | Status: AC
  Administered 2014-05-04: 1.5 g via INTRAVENOUS
  Filled 2014-05-03: qty 1.5

## 2014-05-04 ENCOUNTER — Inpatient Hospital Stay (HOSPITAL_COMMUNITY): Payer: Medicare HMO | Admitting: Anesthesiology

## 2014-05-04 ENCOUNTER — Encounter (HOSPITAL_COMMUNITY)
Admission: RE | Disposition: A | Discharge status: Home / Self Care | Payer: Self-pay | Source: Ambulatory Visit | Attending: Thoracic Surgery (Cardiothoracic Vascular Surgery)

## 2014-05-04 ENCOUNTER — Inpatient Hospital Stay (HOSPITAL_COMMUNITY)
Admission: RE | Admit: 2014-05-04 | Discharge: 2014-05-10 | DRG: 164 | Disposition: A | Discharge status: Home / Self Care | Payer: Medicare HMO | Source: Ambulatory Visit | Attending: Thoracic Surgery (Cardiothoracic Vascular Surgery) | Admitting: Thoracic Surgery (Cardiothoracic Vascular Surgery)

## 2014-05-04 ENCOUNTER — Encounter (HOSPITAL_COMMUNITY): Payer: Self-pay | Admitting: *Deleted

## 2014-05-04 ENCOUNTER — Inpatient Hospital Stay (HOSPITAL_COMMUNITY): Payer: Medicare HMO

## 2014-05-04 DIAGNOSIS — R002 Palpitations: Secondary | ICD-10-CM | POA: Diagnosis present

## 2014-05-04 DIAGNOSIS — E669 Obesity, unspecified: Secondary | ICD-10-CM | POA: Diagnosis present

## 2014-05-04 DIAGNOSIS — Y92239 Unspecified place in hospital as the place of occurrence of the external cause: Secondary | ICD-10-CM | POA: Diagnosis not present

## 2014-05-04 DIAGNOSIS — I739 Peripheral vascular disease, unspecified: Secondary | ICD-10-CM | POA: Diagnosis present

## 2014-05-04 DIAGNOSIS — D62 Acute posthemorrhagic anemia: Secondary | ICD-10-CM | POA: Diagnosis present

## 2014-05-04 DIAGNOSIS — K5900 Constipation, unspecified: Secondary | ICD-10-CM | POA: Diagnosis not present

## 2014-05-04 DIAGNOSIS — Z7982 Long term (current) use of aspirin: Secondary | ICD-10-CM

## 2014-05-04 DIAGNOSIS — J95812 Postprocedural air leak: Secondary | ICD-10-CM | POA: Diagnosis not present

## 2014-05-04 DIAGNOSIS — E1165 Type 2 diabetes mellitus with hyperglycemia: Secondary | ICD-10-CM | POA: Diagnosis present

## 2014-05-04 DIAGNOSIS — J984 Other disorders of lung: Secondary | ICD-10-CM

## 2014-05-04 DIAGNOSIS — C3412 Malignant neoplasm of upper lobe, left bronchus or lung: Secondary | ICD-10-CM | POA: Diagnosis present

## 2014-05-04 DIAGNOSIS — Z6833 Body mass index (BMI) 33.0-33.9, adult: Secondary | ICD-10-CM

## 2014-05-04 DIAGNOSIS — Z79899 Other long term (current) drug therapy: Secondary | ICD-10-CM

## 2014-05-04 DIAGNOSIS — J449 Chronic obstructive pulmonary disease, unspecified: Secondary | ICD-10-CM | POA: Diagnosis present

## 2014-05-04 DIAGNOSIS — F17210 Nicotine dependence, cigarettes, uncomplicated: Secondary | ICD-10-CM | POA: Diagnosis present

## 2014-05-04 DIAGNOSIS — I10 Essential (primary) hypertension: Secondary | ICD-10-CM | POA: Diagnosis present

## 2014-05-04 DIAGNOSIS — E1151 Type 2 diabetes mellitus with diabetic peripheral angiopathy without gangrene: Secondary | ICD-10-CM | POA: Diagnosis present

## 2014-05-04 DIAGNOSIS — Z902 Acquired absence of lung [part of]: Secondary | ICD-10-CM

## 2014-05-04 DIAGNOSIS — Z90710 Acquired absence of both cervix and uterus: Secondary | ICD-10-CM

## 2014-05-04 DIAGNOSIS — E875 Hyperkalemia: Secondary | ICD-10-CM | POA: Diagnosis not present

## 2014-05-04 DIAGNOSIS — Y838 Other surgical procedures as the cause of abnormal reaction of the patient, or of later complication, without mention of misadventure at the time of the procedure: Secondary | ICD-10-CM | POA: Diagnosis present

## 2014-05-04 DIAGNOSIS — J939 Pneumothorax, unspecified: Secondary | ICD-10-CM

## 2014-05-04 DIAGNOSIS — R222 Localized swelling, mass and lump, trunk: Secondary | ICD-10-CM

## 2014-05-04 DIAGNOSIS — R918 Other nonspecific abnormal finding of lung field: Secondary | ICD-10-CM | POA: Diagnosis present

## 2014-05-04 HISTORY — PX: NODE DISSECTION: SHX5269

## 2014-05-04 HISTORY — PX: VIDEO ASSISTED THORACOSCOPY (VATS)/ LOBECTOMY: SHX6169

## 2014-05-04 HISTORY — PX: LEAD REMOVAL: SHX5944

## 2014-05-04 HISTORY — PX: LUNG LOBECTOMY: SHX167

## 2014-05-04 LAB — POCT I-STAT 7, (LYTES, BLD GAS, ICA,H+H)
ACID-BASE DEFICIT: 6 mmol/L — AB (ref 0.0–2.0)
Acid-base deficit: 2 mmol/L (ref 0.0–2.0)
Bicarbonate: 21.2 mEq/L (ref 20.0–24.0)
Bicarbonate: 24.5 mEq/L — ABNORMAL HIGH (ref 20.0–24.0)
CALCIUM ION: 1.12 mmol/L — AB (ref 1.13–1.30)
Calcium, Ion: 1.13 mmol/L (ref 1.13–1.30)
HCT: 25 % — ABNORMAL LOW (ref 36.0–46.0)
HEMATOCRIT: 30 % — AB (ref 36.0–46.0)
Hemoglobin: 8.5 g/dL — ABNORMAL LOW (ref 12.0–15.0)
Hemoglobin: 10.2 g/dL — ABNORMAL LOW (ref 12.0–15.0)
O2 SAT: 93 %
O2 SAT: 100 %
PCO2 ART: 46.9 mmHg — AB (ref 35.0–45.0)
PO2 ART: 225 mmHg — AB (ref 80.0–100.0)
POTASSIUM: 3.4 mmol/L — AB (ref 3.5–5.1)
POTASSIUM: 3.9 mmol/L (ref 3.5–5.1)
Patient temperature: 35.3
Sodium: 142 mmol/L (ref 135–145)
Sodium: 140 mmol/L (ref 135–145)
TCO2: 23 mmol/L (ref 0–100)
TCO2: 26 mmol/L (ref 0–100)
pCO2 arterial: 45.3 mmHg — ABNORMAL HIGH (ref 35.0–45.0)
pH, Arterial: 7.268 — ABNORMAL LOW (ref 7.350–7.450)
pH, Arterial: 7.318 — ABNORMAL LOW (ref 7.350–7.450)
pO2, Arterial: 69 mmHg — ABNORMAL LOW (ref 80.0–100.0)

## 2014-05-04 LAB — GLUCOSE, CAPILLARY
GLUCOSE-CAPILLARY: 129 mg/dL — AB (ref 70–99)
GLUCOSE-CAPILLARY: 141 mg/dL — AB (ref 70–99)
GLUCOSE-CAPILLARY: 160 mg/dL — AB (ref 70–99)
Glucose-Capillary: 132 mg/dL — ABNORMAL HIGH (ref 70–99)

## 2014-05-04 LAB — BLOOD PRODUCT ORDER (VERBAL) VERIFICATION

## 2014-05-04 SURGERY — VIDEO ASSISTED THORACOSCOPY (VATS)/ LOBECTOMY
Anesthesia: General | Site: Chest | Laterality: Left

## 2014-05-04 MED ORDER — ONDANSETRON HCL 4 MG/2ML IJ SOLN: 4.0000 mg | Freq: Four times a day (QID) | INTRAMUSCULAR | Status: DC | PRN

## 2014-05-04 MED ORDER — SUCCINYLCHOLINE CHLORIDE 20 MG/ML IJ SOLN
INTRAMUSCULAR | Status: AC
  Filled 2014-05-04: qty 1

## 2014-05-04 MED ORDER — MIDAZOLAM HCL 2 MG/2ML IJ SOLN
INTRAMUSCULAR | Status: AC
  Filled 2014-05-04: qty 2

## 2014-05-04 MED ORDER — LOTEPREDNOL ETABONATE 0.5 % OP SUSP
1.0000 [drp] | Freq: Two times a day (BID) | OPHTHALMIC | Status: DC
  Administered 2014-05-04 – 2014-05-09 (×11): 1 [drp] via OPHTHALMIC
  Filled 2014-05-04: qty 5

## 2014-05-04 MED ORDER — ACETAMINOPHEN 500 MG PO TABS
1000.0000 mg | ORAL_TABLET | Freq: Four times a day (QID) | ORAL | Status: AC
  Administered 2014-05-04 – 2014-05-08 (×17): 1000 mg via ORAL
  Filled 2014-05-04 (×20): qty 2

## 2014-05-04 MED ORDER — CETYLPYRIDINIUM CHLORIDE 0.05 % MT LIQD
7.0000 mL | Freq: Two times a day (BID) | OROMUCOSAL | Status: DC
  Administered 2014-05-04 – 2014-05-10 (×12): 7 mL via OROMUCOSAL

## 2014-05-04 MED ORDER — ROCURONIUM BROMIDE 50 MG/5ML IV SOLN
INTRAVENOUS | Status: AC
  Filled 2014-05-04: qty 1

## 2014-05-04 MED ORDER — POTASSIUM CHLORIDE 10 MEQ/50ML IV SOLN
10.0000 meq | Freq: Every day | INTRAVENOUS | Status: DC | PRN
  Filled 2014-05-04: qty 50

## 2014-05-04 MED ORDER — TRAMADOL HCL 50 MG PO TABS
50.0000 mg | ORAL_TABLET | Freq: Four times a day (QID) | ORAL | Status: DC | PRN
  Administered 2014-05-09: 100 mg via ORAL
  Filled 2014-05-04: qty 2

## 2014-05-04 MED ORDER — MIDAZOLAM HCL 5 MG/5ML IJ SOLN
INTRAMUSCULAR | Status: DC | PRN
  Administered 2014-05-04: 2 mg via INTRAVENOUS

## 2014-05-04 MED ORDER — CEFUROXIME SODIUM 1.5 G IJ SOLR
1.5000 g | Freq: Two times a day (BID) | INTRAMUSCULAR | Status: AC
  Administered 2014-05-04 – 2014-05-05 (×2): 1.5 g via INTRAVENOUS
  Filled 2014-05-04 (×2): qty 1.5

## 2014-05-04 MED ORDER — GLYCOPYRROLATE 0.2 MG/ML IJ SOLN
INTRAMUSCULAR | Status: AC
  Filled 2014-05-04: qty 4

## 2014-05-04 MED ORDER — DIPHENHYDRAMINE HCL 50 MG/ML IJ SOLN
12.5000 mg | Freq: Four times a day (QID) | INTRAMUSCULAR | Status: DC | PRN
  Filled 2014-05-04: qty 0.25

## 2014-05-04 MED ORDER — LACTATED RINGERS IV SOLN
INTRAVENOUS | Status: DC | PRN
  Administered 2014-05-04 (×2): via INTRAVENOUS

## 2014-05-04 MED ORDER — BISACODYL 5 MG PO TBEC
10.0000 mg | DELAYED_RELEASE_TABLET | Freq: Every day | ORAL | Status: DC
  Administered 2014-05-04 – 2014-05-10 (×6): 10 mg via ORAL
  Filled 2014-05-04 (×7): qty 2

## 2014-05-04 MED ORDER — MOMETASONE FURO-FORMOTEROL FUM 100-5 MCG/ACT IN AERO
2.0000 | INHALATION_SPRAY | Freq: Two times a day (BID) | RESPIRATORY_TRACT | Status: DC
  Administered 2014-05-05 – 2014-05-09 (×9): 2 via RESPIRATORY_TRACT
  Filled 2014-05-04: qty 8.8

## 2014-05-04 MED ORDER — OXYCODONE HCL 5 MG PO TABS
5.0000 mg | ORAL_TABLET | ORAL | Status: DC | PRN
Start: 2014-05-04 — End: 2014-05-10
  Administered 2014-05-07 – 2014-05-10 (×7): 10 mg via ORAL
  Filled 2014-05-04 (×7): qty 2

## 2014-05-04 MED ORDER — GLYCOPYRROLATE 0.2 MG/ML IJ SOLN
INTRAMUSCULAR | Status: DC | PRN
  Administered 2014-05-04: .8 mg via INTRAVENOUS

## 2014-05-04 MED ORDER — SODIUM CHLORIDE 0.9 % IV SOLN
10.0000 mg | INTRAVENOUS | Status: DC | PRN
  Administered 2014-05-04: 100 ug/min via INTRAVENOUS

## 2014-05-04 MED ORDER — PROPOFOL 10 MG/ML IV BOLUS
INTRAVENOUS | Status: DC | PRN
  Administered 2014-05-04: 125 mg via INTRAVENOUS

## 2014-05-04 MED ORDER — SODIUM CHLORIDE 0.9 % IJ SOLN: 9.0000 mL | INTRAMUSCULAR | Status: DC | PRN

## 2014-05-04 MED ORDER — PROPOFOL 10 MG/ML IV BOLUS
INTRAVENOUS | Status: AC
  Filled 2014-05-04: qty 20

## 2014-05-04 MED ORDER — LACTATED RINGERS IV SOLN
INTRAVENOUS | Status: DC | PRN
  Administered 2014-05-04 (×2): via INTRAVENOUS

## 2014-05-04 MED ORDER — CARBOXYMETHYLCELLULOSE SODIUM 0.5 % OP SOLN: 1.0000 [drp] | Freq: Every day | OPHTHALMIC | Status: DC | PRN

## 2014-05-04 MED ORDER — SODIUM BICARBONATE 4.2 % IV SOLN
INTRAVENOUS | Status: DC | PRN
  Administered 2014-05-04: 25 mL via INTRAVENOUS

## 2014-05-04 MED ORDER — PHENYLEPHRINE 40 MCG/ML (10ML) SYRINGE FOR IV PUSH (FOR BLOOD PRESSURE SUPPORT)
PREFILLED_SYRINGE | INTRAVENOUS | Status: AC
  Filled 2014-05-04: qty 10

## 2014-05-04 MED ORDER — DEXTROSE-NACL 5-0.9 % IV SOLN
INTRAVENOUS | Status: DC
  Administered 2014-05-04: 17:00:00 via INTRAVENOUS

## 2014-05-04 MED ORDER — ASPIRIN 81 MG PO CHEW
81.0000 mg | CHEWABLE_TABLET | Freq: Every day | ORAL | Status: DC
  Administered 2014-05-05 – 2014-05-10 (×6): 81 mg via ORAL
  Filled 2014-05-04 (×6): qty 1

## 2014-05-04 MED ORDER — LOTEPREDNOL ETABONATE 0.2 % OP SUSP: 1.0000 [drp] | Freq: Two times a day (BID) | OPHTHALMIC | Status: DC

## 2014-05-04 MED ORDER — HYDROMORPHONE HCL 1 MG/ML IJ SOLN
0.2500 mg | INTRAMUSCULAR | Status: DC | PRN
  Administered 2014-05-04 (×2): 0.5 mg via INTRAVENOUS

## 2014-05-04 MED ORDER — ONDANSETRON HCL 4 MG/2ML IJ SOLN
INTRAMUSCULAR | Status: AC
  Filled 2014-05-04: qty 2

## 2014-05-04 MED ORDER — ONDANSETRON HCL 4 MG/2ML IJ SOLN: 4.0000 mg | Freq: Once | INTRAMUSCULAR | Status: DC | PRN

## 2014-05-04 MED ORDER — LIDOCAINE HCL (CARDIAC) 20 MG/ML IV SOLN
INTRAVENOUS | Status: DC | PRN
  Administered 2014-05-04: 100 mg via INTRAVENOUS

## 2014-05-04 MED ORDER — ACETAMINOPHEN 160 MG/5ML PO SOLN
1000.0000 mg | Freq: Four times a day (QID) | ORAL | Status: AC
  Filled 2014-05-04: qty 40

## 2014-05-04 MED ORDER — FENTANYL 10 MCG/ML IV SOLN
INTRAVENOUS | Status: DC
  Administered 2014-05-04: 0 ug via INTRAVENOUS
  Administered 2014-05-04 (×2): 10 ug via INTRAVENOUS
  Administered 2014-05-05: 40 ug via INTRAVENOUS
  Administered 2014-05-05: 50 ug via INTRAVENOUS
  Administered 2014-05-05: 40 ug via INTRAVENOUS
  Administered 2014-05-05: 30 ug via INTRAVENOUS
  Administered 2014-05-05 (×2): 40 ug via INTRAVENOUS
  Administered 2014-05-06 (×3): 20 ug via INTRAVENOUS
  Administered 2014-05-06: 10 ug via INTRAVENOUS
  Administered 2014-05-06: 20 ug via INTRAVENOUS
  Administered 2014-05-07: 10 ug via INTRAVENOUS
  Administered 2014-05-07: 30 ug via INTRAVENOUS
  Filled 2014-05-04 (×2): qty 50

## 2014-05-04 MED ORDER — LIDOCAINE HCL (CARDIAC) 20 MG/ML IV SOLN
INTRAVENOUS | Status: AC
  Filled 2014-05-04: qty 5

## 2014-05-04 MED ORDER — ALBUTEROL SULFATE (2.5 MG/3ML) 0.083% IN NEBU
2.5000 mg | INHALATION_SOLUTION | RESPIRATORY_TRACT | Status: DC
  Administered 2014-05-04: 2.5 mg via RESPIRATORY_TRACT

## 2014-05-04 MED ORDER — SODIUM BICARBONATE 8.4 % IV SOLN
INTRAVENOUS | Status: AC
  Filled 2014-05-04: qty 50

## 2014-05-04 MED ORDER — 0.9 % SODIUM CHLORIDE (POUR BTL) OPTIME
TOPICAL | Status: DC | PRN
  Administered 2014-05-04: 2000 mL

## 2014-05-04 MED ORDER — ALBUMIN HUMAN 5 % IV SOLN
INTRAVENOUS | Status: DC | PRN
  Administered 2014-05-04: 08:00:00 via INTRAVENOUS

## 2014-05-04 MED ORDER — BUDESONIDE-FORMOTEROL FUMARATE 80-4.5 MCG/ACT IN AERO
2.0000 | INHALATION_SPRAY | Freq: Two times a day (BID) | RESPIRATORY_TRACT | Status: DC | PRN
  Filled 2014-05-04: qty 6.9

## 2014-05-04 MED ORDER — EPHEDRINE SULFATE 50 MG/ML IJ SOLN
INTRAMUSCULAR | Status: DC | PRN
  Administered 2014-05-04: 10 mg via INTRAVENOUS

## 2014-05-04 MED ORDER — ALPRAZOLAM 0.5 MG PO TABS
0.5000 mg | ORAL_TABLET | Freq: Three times a day (TID) | ORAL | Status: DC | PRN
  Administered 2014-05-06 (×2): 0.5 mg via ORAL
  Filled 2014-05-04 (×2): qty 1

## 2014-05-04 MED ORDER — ROCURONIUM BROMIDE 100 MG/10ML IV SOLN
INTRAVENOUS | Status: DC | PRN
  Administered 2014-05-04: 20 mg via INTRAVENOUS
  Administered 2014-05-04: 30 mg via INTRAVENOUS

## 2014-05-04 MED ORDER — ALBUTEROL SULFATE (2.5 MG/3ML) 0.083% IN NEBU
INHALATION_SOLUTION | RESPIRATORY_TRACT | Status: AC
Start: 2014-05-04 — End: 2014-05-04
  Administered 2014-05-04: 2.5 mg via RESPIRATORY_TRACT
  Filled 2014-05-04: qty 3

## 2014-05-04 MED ORDER — VECURONIUM BROMIDE 10 MG IV SOLR
INTRAVENOUS | Status: AC
  Filled 2014-05-04: qty 10

## 2014-05-04 MED ORDER — POLYVINYL ALCOHOL 1.4 % OP SOLN: 1.0000 [drp] | OPHTHALMIC | Status: DC | PRN

## 2014-05-04 MED ORDER — DIPHENHYDRAMINE HCL 12.5 MG/5ML PO ELIX
12.5000 mg | ORAL_SOLUTION | Freq: Four times a day (QID) | ORAL | Status: DC | PRN
  Filled 2014-05-04: qty 5

## 2014-05-04 MED ORDER — SENNOSIDES-DOCUSATE SODIUM 8.6-50 MG PO TABS
1.0000 | ORAL_TABLET | Freq: Every day | ORAL | Status: DC
  Administered 2014-05-04 – 2014-05-09 (×6): 1 via ORAL
  Filled 2014-05-04 (×7): qty 1

## 2014-05-04 MED ORDER — METOPROLOL TARTRATE 25 MG PO TABS
25.0000 mg | ORAL_TABLET | Freq: Two times a day (BID) | ORAL | Status: DC
  Administered 2014-05-04 – 2014-05-10 (×12): 25 mg via ORAL
  Filled 2014-05-04 (×14): qty 1

## 2014-05-04 MED ORDER — STERILE WATER FOR INJECTION IJ SOLN
INTRAMUSCULAR | Status: AC
  Filled 2014-05-04: qty 20

## 2014-05-04 MED ORDER — PHENYLEPHRINE HCL 10 MG/ML IJ SOLN
INTRAMUSCULAR | Status: DC | PRN
  Administered 2014-05-04 (×2): 80 ug via INTRAVENOUS

## 2014-05-04 MED ORDER — VECURONIUM BROMIDE 10 MG IV SOLR
INTRAVENOUS | Status: DC | PRN
  Administered 2014-05-04: 2 mg via INTRAVENOUS

## 2014-05-04 MED ORDER — EPHEDRINE SULFATE 50 MG/ML IJ SOLN
INTRAMUSCULAR | Status: AC
  Filled 2014-05-04: qty 1

## 2014-05-04 MED ORDER — ARTIFICIAL TEARS OP OINT
TOPICAL_OINTMENT | OPHTHALMIC | Status: AC
  Filled 2014-05-04: qty 3.5

## 2014-05-04 MED ORDER — HYDROMORPHONE HCL 1 MG/ML IJ SOLN
INTRAMUSCULAR | Status: AC
  Filled 2014-05-04: qty 1

## 2014-05-04 MED ORDER — INSULIN ASPART 100 UNIT/ML ~~LOC~~ SOLN
0.0000 [IU] | Freq: Four times a day (QID) | SUBCUTANEOUS | Status: DC
  Administered 2014-05-04 (×2): 2 [IU] via SUBCUTANEOUS

## 2014-05-04 MED ORDER — ATORVASTATIN CALCIUM 10 MG PO TABS
10.0000 mg | ORAL_TABLET | Freq: Every day | ORAL | Status: DC
  Administered 2014-05-04 – 2014-05-09 (×6): 10 mg via ORAL
  Filled 2014-05-04 (×7): qty 1

## 2014-05-04 MED ORDER — NEOSTIGMINE METHYLSULFATE 10 MG/10ML IV SOLN
INTRAVENOUS | Status: DC | PRN
  Administered 2014-05-04: 5 mg via INTRAVENOUS

## 2014-05-04 MED ORDER — NALOXONE HCL 0.4 MG/ML IJ SOLN
0.4000 mg | INTRAMUSCULAR | Status: DC | PRN
  Filled 2014-05-04: qty 1

## 2014-05-04 MED ORDER — ONDANSETRON HCL 4 MG/2ML IJ SOLN
INTRAMUSCULAR | Status: DC | PRN
  Administered 2014-05-04: 4 mg via INTRAVENOUS

## 2014-05-04 MED ORDER — ALBUTEROL SULFATE (2.5 MG/3ML) 0.083% IN NEBU
2.5000 mg | INHALATION_SOLUTION | RESPIRATORY_TRACT | Status: DC
  Administered 2014-05-05 – 2014-05-07 (×10): 2.5 mg via RESPIRATORY_TRACT
  Filled 2014-05-04 (×11): qty 3

## 2014-05-04 MED ORDER — FENTANYL CITRATE 0.05 MG/ML IJ SOLN
INTRAMUSCULAR | Status: AC
  Filled 2014-05-04: qty 5

## 2014-05-04 MED ORDER — FENTANYL CITRATE 0.05 MG/ML IJ SOLN
INTRAMUSCULAR | Status: DC | PRN
  Administered 2014-05-04: 50 ug via INTRAVENOUS
  Administered 2014-05-04: 100 ug via INTRAVENOUS
  Administered 2014-05-04 (×2): 50 ug via INTRAVENOUS

## 2014-05-04 MED ORDER — BUPROPION HCL ER (SR) 150 MG PO TB12
150.0000 mg | ORAL_TABLET | Freq: Two times a day (BID) | ORAL | Status: DC
  Administered 2014-05-04 – 2014-05-10 (×12): 150 mg via ORAL
  Filled 2014-05-04 (×13): qty 1

## 2014-05-04 MED ORDER — NEOSTIGMINE METHYLSULFATE 10 MG/10ML IV SOLN
INTRAVENOUS | Status: AC
  Filled 2014-05-04: qty 1

## 2014-05-04 SURGICAL SUPPLY — 84 items
APPLIER CLIP ROT 10 11.4 M/L (STAPLE)
CANISTER SUCTION 2500CC (MISCELLANEOUS) ×3 IMPLANT
CATH KIT ON Q 5IN SLV (PAIN MANAGEMENT) IMPLANT
CATH THORACIC 28FR (CATHETERS) ×3 IMPLANT
CATH THORACIC 28FR RT ANG (CATHETERS) IMPLANT
CATH THORACIC 36FR (CATHETERS) IMPLANT
CATH THORACIC 36FR RT ANG (CATHETERS) IMPLANT
CLIP APPLIE ROT 10 11.4 M/L (STAPLE) IMPLANT
CLIP TI MEDIUM 6 (CLIP) ×3 IMPLANT
CONN ST 1/4X3/8  BEN (MISCELLANEOUS)
CONN ST 1/4X3/8 BEN (MISCELLANEOUS) IMPLANT
CONN Y 3/8X3/8X3/8  BEN (MISCELLANEOUS)
CONN Y 3/8X3/8X3/8 BEN (MISCELLANEOUS) IMPLANT
CONT SPEC 4OZ CLIKSEAL STRL BL (MISCELLANEOUS) ×42 IMPLANT
COVER SURGICAL LIGHT HANDLE (MISCELLANEOUS) IMPLANT
DERMABOND ADVANCED (GAUZE/BANDAGES/DRESSINGS) ×2
DERMABOND ADVANCED .7 DNX12 (GAUZE/BANDAGES/DRESSINGS) ×1 IMPLANT
DRAPE LAPAROSCOPIC ABDOMINAL (DRAPES) ×3 IMPLANT
DRAPE WARM FLUID 44X44 (DRAPE) ×3 IMPLANT
ELECT BLADE 6.5 EXT (BLADE) ×3 IMPLANT
ELECT REM PT RETURN 9FT ADLT (ELECTROSURGICAL) ×3
ELECTRODE REM PT RTRN 9FT ADLT (ELECTROSURGICAL) ×1 IMPLANT
GAUZE SPONGE 4X4 12PLY STRL (GAUZE/BANDAGES/DRESSINGS) ×3 IMPLANT
GLOVE BIO SURGEON STRL SZ7.5 (GLOVE) ×3 IMPLANT
GLOVE BIOGEL PI IND STRL 6.5 (GLOVE) ×2 IMPLANT
GLOVE BIOGEL PI INDICATOR 6.5 (GLOVE) ×4
GLOVE ECLIPSE 6.5 STRL STRAW (GLOVE) ×6 IMPLANT
GLOVE SURG SIGNA 7.5 PF LTX (GLOVE) ×6 IMPLANT
GOWN STRL REUS W/ TWL LRG LVL3 (GOWN DISPOSABLE) ×3 IMPLANT
GOWN STRL REUS W/ TWL XL LVL3 (GOWN DISPOSABLE) ×1 IMPLANT
GOWN STRL REUS W/TWL LRG LVL3 (GOWN DISPOSABLE) ×6
GOWN STRL REUS W/TWL XL LVL3 (GOWN DISPOSABLE) ×2
HANDLE STAPLE ENDO GIA SHORT (STAPLE) ×2
HEMOSTAT SURGICEL 2X14 (HEMOSTASIS) IMPLANT
KIT BASIN OR (CUSTOM PROCEDURE TRAY) ×3 IMPLANT
KIT ROOM TURNOVER OR (KITS) ×3 IMPLANT
KIT SUCTION CATH 14FR (SUCTIONS) IMPLANT
NS IRRIG 1000ML POUR BTL (IV SOLUTION) ×6 IMPLANT
PACK CHEST (CUSTOM PROCEDURE TRAY) ×3 IMPLANT
PAD ARMBOARD 7.5X6 YLW CONV (MISCELLANEOUS) ×6 IMPLANT
POUCH ENDO CATCH II 15MM (MISCELLANEOUS) ×3 IMPLANT
POUCH SPECIMEN RETRIEVAL 10MM (ENDOMECHANICALS) IMPLANT
PROBE CRYO2-ABLATION MALLABLE (MISCELLANEOUS) ×3 IMPLANT
RELOAD EGIA 45 MED/THCK PURPLE (STAPLE) ×12 IMPLANT
RELOAD EGIA 45 TAN VASC (STAPLE) ×3 IMPLANT
RELOAD EGIA TRIS TAN 45 CVD (STAPLE) ×12 IMPLANT
SCISSORS ENDO CVD 5DCS (MISCELLANEOUS) IMPLANT
SEALANT PROGEL (MISCELLANEOUS) IMPLANT
SEALANT SURG COSEAL 4ML (VASCULAR PRODUCTS) IMPLANT
SEALANT SURG COSEAL 8ML (VASCULAR PRODUCTS) IMPLANT
SOLUTION ANTI FOG 6CC (MISCELLANEOUS) ×3 IMPLANT
SPECIMEN JAR MEDIUM (MISCELLANEOUS) IMPLANT
SPONGE GAUZE 4X4 12PLY STER LF (GAUZE/BANDAGES/DRESSINGS) ×3 IMPLANT
STAPLER ENDO GIA 12MM SHORT (STAPLE) ×1 IMPLANT
SUT PROLENE 4 0 RB 1 (SUTURE)
SUT PROLENE 4-0 RB1 .5 CRCL 36 (SUTURE) IMPLANT
SUT SILK  1 MH (SUTURE) ×4
SUT SILK 1 MH (SUTURE) ×2 IMPLANT
SUT SILK 1 TIES 10X30 (SUTURE) ×3 IMPLANT
SUT SILK 2 0 SH (SUTURE) ×3 IMPLANT
SUT SILK 2 0SH CR/8 30 (SUTURE) IMPLANT
SUT SILK 3 0 SH 30 (SUTURE) IMPLANT
SUT SILK 3 0SH CR/8 30 (SUTURE) IMPLANT
SUT VIC AB 1 CTX 36 (SUTURE) ×2
SUT VIC AB 1 CTX36XBRD ANBCTR (SUTURE) ×1 IMPLANT
SUT VIC AB 2-0 CTX 36 (SUTURE) ×3 IMPLANT
SUT VIC AB 2-0 UR6 27 (SUTURE) IMPLANT
SUT VIC AB 3-0 MH 27 (SUTURE) IMPLANT
SUT VIC AB 3-0 X1 27 (SUTURE) ×3 IMPLANT
SUT VICRYL 2 TP 1 (SUTURE) IMPLANT
SWAB COLLECTION DEVICE MRSA (MISCELLANEOUS) IMPLANT
SYR 20ML ECCENTRIC (SYRINGE) ×3 IMPLANT
SYSTEM SAHARA CHEST DRAIN ATS (WOUND CARE) ×3 IMPLANT
TAPE CLOTH 4X10 WHT NS (GAUZE/BANDAGES/DRESSINGS) ×3 IMPLANT
TAPE CLOTH SURG 6X10 WHT LF (GAUZE/BANDAGES/DRESSINGS) ×3 IMPLANT
TIP APPLICATOR SPRAY EXTEND 16 (VASCULAR PRODUCTS) IMPLANT
TOWEL OR 17X24 6PK STRL BLUE (TOWEL DISPOSABLE) ×3 IMPLANT
TOWEL OR 17X26 10 PK STRL BLUE (TOWEL DISPOSABLE) ×3 IMPLANT
TRAP SPECIMEN MUCOUS 40CC (MISCELLANEOUS) IMPLANT
TRAY FOLEY CATH 16FRSI W/METER (SET/KITS/TRAYS/PACK) ×3 IMPLANT
TROCAR XCEL BLADELESS 5X75MML (TROCAR) ×3 IMPLANT
TUBE ANAEROBIC SPECIMEN COL (MISCELLANEOUS) IMPLANT
TUNNELER SHEATH ON-Q 11GX8 DSP (PAIN MANAGEMENT) IMPLANT
WATER STERILE IRR 1000ML POUR (IV SOLUTION) ×6 IMPLANT

## 2014-05-04 NOTE — Progress Notes (Signed)
Utilization review completed.  

## 2014-05-04 NOTE — Anesthesia Preprocedure Evaluation (Addendum)
Anesthesia Evaluation  Patient identified by MRN, date of birth, ID band Patient awake    Reviewed: Allergy & Precautions, H&P , NPO status , Patient's Chart, lab work & pertinent test results, reviewed documented beta blocker date and time   Airway Mallampati: I  TM Distance: >3 FB Neck ROM: full    Dental  (+) Edentulous Upper, Edentulous Lower, Dental Advidsory Given   Pulmonary shortness of breath and with exertion, COPD COPD inhaler, Current Smoker,          Cardiovascular hypertension, + Peripheral Vascular Disease Rhythm:regular     Neuro/Psych    GI/Hepatic   Endo/Other  diabetes, Type 2, Oral Hypoglycemic Agents  Renal/GU      Musculoskeletal   Abdominal   Peds  Hematology   Anesthesia Other Findings   Reproductive/Obstetrics                            Anesthesia Physical Anesthesia Plan  ASA: III  Anesthesia Plan: General   Post-op Pain Management:    Induction: Intravenous  Airway Management Planned: Double Lumen EBT  Additional Equipment: Arterial line and CVP  Intra-op Plan:   Post-operative Plan: Possible Post-op intubation/ventilation  Informed Consent: I have reviewed the patients History and Physical, chart, labs and discussed the procedure including the risks, benefits and alternatives for the proposed anesthesia with the patient or authorized representative who has indicated his/her understanding and acceptance.   Dental Advisory Given  Plan Discussed with: Anesthesiologist, CRNA and Surgeon  Anesthesia Plan Comments:        Anesthesia Quick Evaluation

## 2014-05-04 NOTE — Transfer of Care (Signed)
Immediate Anesthesia Transfer of Care Note  Patient: [name] [name]  Procedure(s) Performed: Procedure(s): LEFT VIDEO ASSISTED THORACOSCOPY (VATS)/LEFT UPPER LOBECTOMY (Left) CRYO INTERCOSTAL NERVE BLOCK (Left) NODE DISSECTION (Left)  Patient Location: PACU  Anesthesia Type:General  Level of Consciousness: sedated  Airway & Oxygen Therapy: Patient Spontanous Breathing and Patient connected to face mask oxygen  Post-op Assessment: Report given to RN, Post -op Vital signs reviewed and stable and Patient moving all extremities X 4  Post vital signs: Reviewed and stable  Last Vitals:  Filed Vitals:   05/04/14 0553  BP: 135/61  Pulse: 79  Temp: 36.7 C  Resp: 20    Complications: No apparent anesthesia complications

## 2014-05-04 NOTE — Anesthesia Procedure Notes (Signed)
Procedure Name: Intubation Date/Time: 05/04/2014 8:10 AM Performed by: Neldon Newport Pre-anesthesia Checklist: Patient being monitored, Suction available, Emergency Drugs available, Patient identified and Timeout performed Patient Re-evaluated:Patient Re-evaluated prior to inductionOxygen Delivery Method: Circle system utilized Preoxygenation: Pre-oxygenation with 100% oxygen Intubation Type: IV induction Ventilation: Mask ventilation without difficulty Laryngoscope Size: Mac and 3 Grade View: Grade I Tube type: MLT Endobronchial tube: Left, Double lumen EBT, EBT position confirmed by auscultation and EBT position confirmed by fiberoptic bronchoscope and 35 Fr Number of attempts: 4 Airway Equipment and Method: Fiberoptic brochoscope Placement Confirmation: positive ETCO2,  ETT inserted through vocal cords under direct vision and breath sounds checked- equal and bilateral Tube secured with: Tape Dental Injury: Teeth and Oropharynx as per pre-operative assessment  Difficulty Due To: Difficulty was unanticipated

## 2014-05-04 NOTE — Anesthesia Postprocedure Evaluation (Signed)
  Anesthesia Post-op Note  Patient: [name] [name]  Procedure(s) Performed: Procedure(s): LEFT VIDEO ASSISTED THORACOSCOPY (VATS)/LEFT UPPER LOBECTOMY (Left) CRYO INTERCOSTAL NERVE BLOCK (Left) NODE DISSECTION (Left)  Patient Location: PACU  Anesthesia Type:General  Level of Consciousness: awake, oriented, sedated and patient cooperative  Airway and Oxygen Therapy: Patient Spontanous Breathing  Post-op Pain: mild  Post-op Assessment: Post-op Vital signs reviewed, Patient's Cardiovascular Status Stable, Respiratory Function Stable, Patent Airway, No signs of Nausea or vomiting and Pain level controlled  Post-op Vital Signs: stable  Last Vitals:  Filed Vitals:   05/04/14 0553  BP: 135/61  Pulse: 79  Temp: 36.7 C  Resp: 20    Complications: No apparent anesthesia complications

## 2014-05-04 NOTE — Brief Op Note (Addendum)
05/04/2014      [address]       [place] [address]             [phone]     05/04/2014  11:44 AM  PATIENT:  Madaline Guthrie  81 y.o. female  PRE-OPERATIVE DIAGNOSIS:  Left Upper Lobe Lung Mass  POST-OPERATIVE DIAGNOSIS:  NON-SMALL CELL CARCINOMA Left Upper Lobe- Clinical Stage IB  PROCEDURE:  Procedure(s): LEFT VIDEO ASSISTED THORACOSCOPY (VATS)/LEFT UPPER LOBECTOMY CRYO INTERCOSTAL NERVE BLOCK NODE DISSECTION  SURGEON:  Surgeon(s): [name], MD  PHYSICIAN ASSISTANT: WAYNE GOLD PA-C  ANESTHESIA:   general  SPECIMEN:  Source of Specimen:  LUL AND MULTIPLE LN SAMPLES  DISPOSITION OF SPECIMEN:  Pathology  DRAINS: 2 Chest Tube(s) in the LEFT HEMITHORAX   PATIENT CONDITION:  PACU - hemodynamically stable.  PRE-OPERATIVE WEIGHT: 81kg  EBL- 200 CC  FROZEN: CARCINOMA, NEG MARGINS  COMPLICATIONS: NO KNOWN  FINDINGS: 4 cm mass lateral aspect of LUL- frozen= non-small cell CA, margins negative

## 2014-05-04 NOTE — H&P (View-Only) (Signed)
HPI:  [name] [name] is a 81 year old woman with a left upper lobe mass was on the office 2 weeks ago. She was uncertain as to how she would like to proceed with treatment. She had a brain MR and now returns to discuss the results of that study as well as decide how to proceed with treatment of her lung mass.  She has been very anxious about her results.  She is a poor historian and complained of difficulty walking. It was unclear if this was due to hip pain or shortness of breath. I walked with her around the upper level of our office building to the elevators and back. She did not have any shortness of breath. She did complain of hip pain.   I reviewed her notes from Dr. Irven Shelling office. Her echocardiogram showed left ventricular hypertrophy with grade 1 diastolic dysfunction. Ejection fraction was 57%. There was trace tricuspid regurgitation, no evidence of pulmonary hypertension. ABIs were 0.45 on the right and 0.89 on the left. Her nuclear stress study was nondiagnostic for ischemia. There was prominent breast attenuation artifact in the inferior wall without demonstrable ischemia. This was interpreted as a low risk study  03/31/2014 [name] [name] is a 81 year old woman who has been in generally good health most of her life. She is a very poor historian. She has a long history of tobacco abuse having started smoking at age 36. She smoked just less than a pack a day for her adult life. She still smokes less than half a pack of cigarettes daily.  She recently saw Dr. Einar Gip apparently for workup of shortness of breath with exertion. A chest x-ray showed a lung mass. That led to a CT scan which showed a 4 cm left upper lobe mass as well as extensive emphysematous changes in the upper lobes bilaterally. There was no obvious adenopathy.  She was referred to Dr. Melvyn Novas. He recommended a PET/CT. The primary mass was markedly hypermetabolic with an SUV of 27. It was adjacent to the chest wall but no definite  evidence of invasion. There was no evidence of nodal involvement or distant metastases.  She says that she was having a lot of breathing issues back in November. She had a cough and was treated with antibiotics. She says that she now does not short of breath with walking. She walks up a flight of stairs to get into her apartment. She says that her activities are mostly limited by right hip pain and back pain. She says that Dr. Einar Gip found that she has peripheral arterial disease.  She is on metformin for "prediabetes". She denies chest pain, pressure or tightness when asked directly about them. She has had a cough recently. She has not had any hemoptysis. She says her appetite is been poor and she's lost about 20 pounds over the past 3 months. She's not had any unusual headaches. She recently had cataract surgery and has had some blurred vision since. She denies wheezing and says she has not had to use any inhalers.  Past Medical History  Diagnosis Date  . Palpitations   . SOB (shortness of breath)   . COPD (chronic obstructive pulmonary disease)   . HTN (hypertension)    Past Surgical History  Procedure Laterality Date  . Vaginal hysterectomy  1966    Current Outpatient Prescriptions  Medication Sig Dispense Refill  . ALPRAZolam (XANAX) 0.5 MG tablet Take 0.5 mg by mouth 3 (three) times daily as needed for anxiety.    Marland Kitchen  aspirin 81 MG tablet Take 81 mg by mouth daily.    Marland Kitchen atorvastatin (LIPITOR) 10 MG tablet Take 10 mg by mouth daily.    Marland Kitchen buPROPion (WELLBUTRIN SR) 150 MG 12 hr tablet Take 150 mg by mouth every 12 (twelve) hours.    . metFORMIN (GLUCOPHAGE) 500 MG tablet Take 500 mg by mouth 2 (two) times daily with a meal.    . metoprolol succinate (TOPROL-XL) 50 MG 24 hr tablet Take 50 mg by mouth 2 (two) times daily. Take with or immediately following a meal.    . valsartan-hydrochlorothiazide (DIOVAN-HCT) 160-25 MG per tablet Take 1 tablet by mouth daily.     No current  facility-administered medications for this visit.   Family History  Problem Relation Age of Onset  . Cervical cancer Mother   . Prostate cancer Father   . Stroke Maternal Grandmother   . Angina Maternal Grandfather   . Prostate cancer Paternal Uncle    History   Social History  . Marital Status: Single    Spouse Name: N/A  . Number of Children: N/A  . Years of Education: N/A   Occupational History  . retired    Social History Main Topics  . Smoking status: Current Every Day Smoker -- 0.75 packs/day for 69 years    Types: Cigarettes  . Smokeless tobacco: Not on file     Comment: Pt is trying to quit  . Alcohol Use: No  . Drug Use: No  . Sexual Activity: Not on file   Other Topics Concern  . Not on file   Social History Narrative     Physical Exam  BP 175/87 mmHg  Pulse 88  Resp 20  Ht 5\' 2"  (1.575 m)  Wt 180 lb (81.647 kg)  BMI 32.91 kg/m2  SpO2 96%  Constitutional: She is oriented to person, place, and time. No distress.  Obese  HENT:  Head: Normocephalic and atraumatic.  Eyes: EOM are normal. Pupils are equal, round, and reactive to light.  Neck: Normal range of motion. Neck supple. No thyromegaly present.  Bilateral carotid bruits left greater than right  Cardiovascular: Normal rate, regular rhythm and normal heart sounds.  No murmur heard. Pulmonary/Chest: Effort normal. She has no wheezes.  Diminished breath sounds bilaterally  Abdominal: Soft. There is no tenderness.  Musculoskeletal: She exhibits no edema.  Lymphadenopathy:   She has no cervical adenopathy.  Neurological: She is alert and oriented to person, place, and time. No cranial nerve deficit.  No focal motor deficits  Skin: Skin is warm and dry.  Vitals reviewed.    Diagnostic Tests: NUCLEAR MEDICINE PET SKULL BASE TO THIGH  TECHNIQUE: 9.7 mCi F-18 FDG was injected intravenously. Full-ring PET imaging was performed from the skull base to thigh after the radiotracer. CT data  was obtained and used for attenuation correction and anatomic localization.  FASTING BLOOD GLUCOSE: Value: 103 mg/dl  COMPARISON: None.  FINDINGS: NECK  No hypermetabolic lymph nodes in the neck.  CHEST  Hypermetabolic mass in the left upper lobe measures 4.5 x 3.9 cm. Max abuts the pleural surface. This left upper lobe mass is intensely metabolic with SUV max equal 26.7.  No no hypermetabolic mediastinal lymph nodes. AP window node measuring 9 mm does not have metabolic activity above background.  In the right upper lobe, small nodule along the pleural surface measures 7 mm (image 49, series 4). No clear associated metabolic activity.  ABDOMEN/PELVIS  No abnormal hypermetabolic activity within the liver, pancreas,  adrenal glands, or spleen. No hypermetabolic lymph nodes in the abdomen or pelvis. Severe atherosclerotic calcification abdominal aorta. Diverticulosis of the colon without diverticulitis.  SKELETON  No focal hypermetabolic activity to suggest skeletal metastasis.  IMPRESSION: 1. Hypermetabolic left upper lobe mass abutting the pleural surface. 2. No clear hypermetabolic mediastinal lymph nodes. 3. Staging by FDG PET imaging T2a N0 M0 4. Subpleural nodule in the right upper lobe measures 7 mm. Recommend attention on follow-up. 5. Extensive centrilobular emphysema.   Electronically Signed  By: Suzy Bouchard M.D.  On: 03/29/2014 16:03  I personally reviewed the CT from [facility] and the PET CT. My recommendations are based on my interpretation.  Pulmonary function tests FVC 2.77 (132%) FEV1 1.57 (102%) FEV1/FVC 57% Vital Capacity 2.09  I reviewed her notes from Dr. Irven Shelling office. Her echocardiogram showed left ventricular hypertrophy with grade 1 diastolic dysfunction. Ejection fraction was 57%. There was trace tricuspid regurgitation, no evidence of pulmonary hypertension. ABIs were 0.45 on the right and 0.89 on the left.  Her nuclear stress study was nondiagnostic for ischemia. There was prominent breast attenuation artifact in the inferior wall without demonstrable ischemia. This was interpreted as a low risk study  Impression: [name] [name] is a 81 year old smoker with COPD who has a 4 cm left upper lobe mass abutting the chest wall. This is highly suspicious for a primary bronchogenic carcinoma. It has to be considered a lung cancer unless it can be proven otherwise. My recommendation was to proceed with surgical resection. She is a relatively high risk patient given her age, obesity, peripheral arterial disease, diabetes, and COPD. We did discuss the alternative option of biopsy followed by chemotherapy and radiation. She does understand that depending on intraoperative findings we may recommend chemotherapy and/or radiation postoperatively.  She understands that surgery is not a guarantee of cure, but offers the best hope for that.  She was accompanied by her daughter, who was here at her last visit, and her son-in-law.  I once again reviewed the general nature of the proposed surgical procedure. We again discussed the incisions to be used, the need for general anesthesia, the expected hospital stay, and overall recovery. We reviewed the indications, risks, benefits, and alternatives. She understands the risks include, but are not limited to death, MI, DVT, PE, bleeding, possible need for transfusion, stroke, infection, air leak, cardiac arrhythmias, as well as the possibility of unforeseeable complications.  We also discussed the use of prior analgesia of intercostal nerves to help with postoperative pain. She does wish to have that done. She does understand that would depend on whether or not there is chest wall invasion.  She has decided to proceed with surgery.  Plan: Left VATS, left upper lobectomy, cryo-analgesia of intercostal nerves on Thursday, 05/04/2014  [name], MD [facility] [phone]

## 2014-05-04 NOTE — Progress Notes (Signed)
CT surgery p.m. Rounds  Patient breathing comfortably Mild air leak through chest tube Stable postop lobectomy today

## 2014-05-04 NOTE — Interval H&P Note (Signed)
History and Physical Interval Note:  05/04/2014 7:44 AM  [name] [name]  has presented today for surgery, with the diagnosis of Left Upper Lobe Lung Mass  The various methods of treatment have been discussed with the patient and family. After consideration of risks, benefits and other options for treatment, the patient has consented to  Procedure(s): LEFT VIDEO ASSISTED THORACOSCOPY (VATS)/LEFT UPPER LOBECTOMY (Left) CRYO INTERCOSTAL NERVE BLOCK (N/A) as a surgical intervention .  The patient's history has been reviewed, patient examined, no change in status, stable for surgery.  I have reviewed the patient's chart and labs.  Questions were answered to the patient's satisfaction.     [name]

## 2014-05-05 ENCOUNTER — Encounter (HOSPITAL_COMMUNITY): Payer: Self-pay | Admitting: Thoracic Surgery (Cardiothoracic Vascular Surgery)

## 2014-05-05 ENCOUNTER — Inpatient Hospital Stay (HOSPITAL_COMMUNITY): Payer: Medicare HMO

## 2014-05-05 LAB — CBC
HEMATOCRIT: 32.1 % — AB (ref 36.0–46.0)
Hemoglobin: 10.7 g/dL — ABNORMAL LOW (ref 12.0–15.0)
MCH: 27.5 pg (ref 26.0–34.0)
MCHC: 33.3 g/dL (ref 30.0–36.0)
MCV: 82.5 fL (ref 78.0–100.0)
PLATELETS: 268 10*3/uL (ref 150–400)
RBC: 3.89 MIL/uL (ref 3.87–5.11)
RDW: 14.1 % (ref 11.5–15.5)
WBC: 11 10*3/uL — ABNORMAL HIGH (ref 4.0–10.5)

## 2014-05-05 LAB — POCT I-STAT 3, ART BLOOD GAS (G3+)
Acid-base deficit: 1 mmol/L (ref 0.0–2.0)
Bicarbonate: 24 mEq/L (ref 20.0–24.0)
O2 SAT: 85 %
PCO2 ART: 39.3 mmHg (ref 35.0–45.0)
PH ART: 7.391 (ref 7.350–7.450)
PO2 ART: 49 mmHg — AB (ref 80.0–100.0)
Patient temperature: 97.9
TCO2: 25 mmol/L (ref 0–100)

## 2014-05-05 LAB — BASIC METABOLIC PANEL
Anion gap: 6 (ref 5–15)
BUN: 8 mg/dL (ref 6–23)
CO2: 24 mmol/L (ref 19–32)
Calcium: 7.8 mg/dL — ABNORMAL LOW (ref 8.4–10.5)
Chloride: 107 mmol/L (ref 96–112)
Creatinine, Ser: 0.73 mg/dL (ref 0.50–1.10)
GFR calc Af Amer: >90 mL/min (ref >90)
GFR calc non Af Amer: 80 mL/min — ABNORMAL LOW (ref >90)
Glucose, Bld: 106 mg/dL — ABNORMAL HIGH (ref 70–99)
Potassium: 4 mmol/L (ref 3.5–5.1)
Sodium: 137 mmol/L (ref 135–145)

## 2014-05-05 LAB — GLUCOSE, CAPILLARY
Glucose-Capillary: 118 mg/dL — ABNORMAL HIGH (ref 70–99)
Glucose-Capillary: 152 mg/dL — ABNORMAL HIGH (ref 70–99)
Glucose-Capillary: 123 mg/dL — ABNORMAL HIGH (ref 70–99)
Glucose-Capillary: 126 mg/dL — ABNORMAL HIGH (ref 70–99)

## 2014-05-05 MED ORDER — ENOXAPARIN SODIUM 40 MG/0.4ML ~~LOC~~ SOLN
40.0000 mg | SUBCUTANEOUS | Status: DC
  Administered 2014-05-05 – 2014-05-09 (×5): 40 mg via SUBCUTANEOUS
  Filled 2014-05-05 (×6): qty 0.4

## 2014-05-05 MED ORDER — INSULIN ASPART 100 UNIT/ML ~~LOC~~ SOLN
0.0000 [IU] | Freq: Three times a day (TID) | SUBCUTANEOUS | Status: DC
  Administered 2014-05-05: 2 [IU] via SUBCUTANEOUS
  Administered 2014-05-05: 3 [IU] via SUBCUTANEOUS
  Administered 2014-05-05 – 2014-05-10 (×5): 2 [IU] via SUBCUTANEOUS

## 2014-05-05 NOTE — Progress Notes (Signed)
1 Day Post-Op Procedure(s) (LRB): LEFT VIDEO ASSISTED THORACOSCOPY (VATS)/LEFT UPPER LOBECTOMY (Left) CRYO INTERCOSTAL NERVE BLOCK (Left) NODE DISSECTION (Left) Subjective: No complaints, only hurts when she coughs, denies nausea  Objective: Vital signs in last 24 hours: Temp:  [96.2 F (35.7 C)-98.3 F (36.8 C)] 98.3 F (36.8 C) (03/25 0714) Pulse Rate:  [60-76] 67 (03/25 0700) Cardiac Rhythm:  [-] Normal sinus rhythm (03/25 0745) Resp:  [8-29] 18 (03/25 0800) BP: (88-135)/(20-84) 135/60 mmHg (03/25 0700) SpO2:  [89 %-100 %] 100 % (03/25 0800) Arterial Line BP: (89-124)/(48-67) 95/67 mmHg (03/25 0400) Weight:  [179 lb 14.3 oz (81.6 kg)] 179 lb 14.3 oz (81.6 kg) (03/24 1459)  Hemodynamic parameters for last 24 hours:    Intake/Output from previous day: 03/24 0701 - 03/25 0700 In: 5675.4 [P.O.:480; I.V.:4310.4; Blood:335; IV Piggyback:550] Out: 2465 [Urine:1235; Blood:300; Chest Tube:930] Intake/Output this shift: Total I/O In: 50 [IV Piggyback:50] Out: -   General appearance: alert, cooperative and no distress Neurologic: intact Heart: regular rate and rhythm Lungs: diminished breath sounds left side Abdomen: normal findings: soft, non-tender + air leak  Lab Results:  Recent Labs  05/02/14 1309  05/04/14 1100 05/05/14 0530  WBC 12.2*  --   --  11.0*  HGB 11.8*  < > 10.2* 10.7*  HCT 35.2*  < > 30.0* 32.1*  PLT 375  --   --  268  < > = values in this interval not displayed. BMET:  Recent Labs  05/02/14 1309  05/04/14 1100 05/05/14 0530  NA 139  < > 140 137  K 4.3  < > 3.9 4.0  CL 108  --   --  107  CO2 21  --   --  24  GLUCOSE 99  --   --  106*  BUN 11  --   --  8  CREATININE 0.80  --   --  0.73  CALCIUM 9.7  --   --  7.8*  < > = values in this interval not displayed.  PT/INR:  Recent Labs  05/02/14 1309  LABPROT 13.4  INR 1.01   ABG    Component Value Date/Time   PHART 7.391 05/05/2014 0523   HCO3 24.0 05/05/2014 0523   TCO2 25 05/05/2014  0523   ACIDBASEDEF 1.0 05/05/2014 0523   O2SAT 85.0 05/05/2014 0523   CBG (last 3)   Recent Labs  05/04/14 1809 05/04/14 2340 05/05/14 0524  GLUCAP 129* 141* 118*    Assessment/Plan: S/P Procedure(s) (LRB): LEFT VIDEO ASSISTED THORACOSCOPY (VATS)/LEFT UPPER LOBECTOMY (Left) CRYO INTERCOSTAL NERVE BLOCK (Left) NODE DISSECTION (Left) -  POD # 1 Left upper lobectomy  CV- stable  RESP- s/p lobectomy- has a small to moderate air leak- keep CT on suction  Pain well controlled  RENAL- lytes and creatinine OK  ENDO- CBG well controlled  DVT prophylaxis- SCD, add enoxaparin  ambulate   LOS: 1 day    [name] 05/05/2014

## 2014-05-05 NOTE — Progress Notes (Signed)
      [address]       [place] [address]             [phone]      Up in chair  BP 109/52 mmHg  Pulse 66  Temp(Src) 98 F (36.7 C) (Oral)  Resp 25  Ht 5\' 2"  (1.575 m)  Wt 179 lb 14.3 oz (81.6 kg)  BMI 32.89 kg/m2  SpO2 94%   Intake/Output Summary (Last 24 hours) at 05/05/14 1802 Last data filed at 05/05/14 1600  Gross per 24 hour  Intake 3472.5 ml  Output   2165 ml  Net 1307.5 ml    Doing well continue current care  Remo Lipps C. Roxan Hockey, MD [facility] [phone]

## 2014-05-05 NOTE — Op Note (Signed)
NAMEJOELYNN, DUST NO.:  [id number]  MEDICAL RECORD NO.:  [id number]  LOCATION:  2S16C                        FACILITY:  [facility]  PHYSICIAN:  Revonda Standard. Roxan Hockey, M.D.DATE OF BIRTH:  [birth year]  DATE OF PROCEDURE:  05/04/2014 DATE OF DISCHARGE:                              OPERATIVE REPORT   PREOPERATIVE DIAGNOSIS:  Left upper lobe mass.  POSTOPERATIVE DIAGNOSIS:  Non-small cell carcinoma left upper lobe, clinical stage IIA.  PROCEDURE:  Left video-assisted thoracoscopy Thoracoscopic left upper lobectomy Mediastinal lymph node dissection Cryoanalgesia of intercostal nerves 3 through 8.  SURGEON:  Revonda Standard. Roxan Hockey, M.D.  ASSISTANT:  John Giovanni, PA-C.  ANESTHESIA:  General.  FINDINGS:  A 4 cm mass in the lateral aspect of the left upper lobe not involving the chest wall. Possibly involving the visceral pleura. Frozen section revealed non-small cell carcinoma. Bronchial and vascular margins negative for tumor. Enlarged, but otherwise benign-appearing nodes.  CLINICAL NOTE:  Ms. Koons is a 81 year old woman with history of tobacco abuse and COPD, who recently was found to have a 4 cm left upper lobe mass.  This was hypermetabolic on PET with no evidence of metastatic disease.  She was a high-risk surgical candidate, but it was felt that she potentially could have surgery if she would accept the higher risk and potential for some significant physical limitations postoperatively.  She understood and accepted the risks and agreed to proceed.  OPERATIVE NOTE:  Mrs. Bracken was brought to the preoperative holding area on May 04, 2014.  The Anesthesia Service placed a central line and an arterial blood pressure monitoring line.  She was taken to the operating room, anesthetized, and intubated with a double-lumen endotracheal tube.  A Foley catheter was placed.  Sequential compressive devices were placed on the calves for DVT prophylaxis.   Intravenous antibiotics were administered.  She was placed in a right lateral decubitus position and the left chest was prepped and draped in the usual sterile fashion.  Single lung ventilation of the right lung was initiated.    An incision was made in the seventh intercostal space in the midaxillary line.  The chest was entered using a 5 mm port.  The 5 mm thoracoscope was advanced into the chest.  There was cross ventilation of the left lung. After repositioning the double-lumen endotracheal tube, there was good isolation of the left lung.  The patient tolerated single lung ventilation well throughout the procedure.  An incision was made in the fourth interspace anterolaterally.  This was approximately 5 cm in length.  No rib spreading was performed during the procedure.  The left upper lobe was inspected.  There was a 4 cm mass easily visible and possibly involving the visceral pleura.  There were some adhesions of the upper lobe to the anterior mediastinum anteriorly. These were taken down with electrocautery.  These adhesions were not in the vicinity of the tumor.  The fissure appeared relatively complete.  The cryoanalgesia probe then was placed at the third interspace just below the rib posteriorly. A 2 cm segment of the probe was placed over the neurovascular bundle and taken to -70 degrees Celsius for 2 minutes. This process was  repeated at the 4th through 8th ribs.  The pleural reflection then was divided at the hilum anteriorly.  The portion of the fissure between the lingula and the lower lobe was taken down with cautery.  There were some enlarged nodes in the hilum.  Both 10 and 11 L nodes were taken and all lymph nodes that were encountered during the dissection were removed and sent for permanent pathology.  Many of the nodes were relatively large, but all nodes otherwise appeared benign.  There was a small lingular arterial branch that was encircled as the nodes were  dissected off. This was divided with an endoscopic vascular stapler.  The dissection then was carried posteriorly in the fissure.  The majority of the fissure was complete, but laterally and posteriorly the portion of the fissure between the upper lobe and the superior segment was not complete.  This was divided with sequential firings of an endoscopic GIA stapler.  The entire pulmonary artery was now visible in the fissure.  A second lingular branch was noted. This was larger than the first.  This was dissected out, encircled, and divided with an endoscopic vascular stapler.  Next, the dissection was carried back anteriorly.  The superior pulmonary vein and the inferior pulmonary vein were identified. The superior vein was encircled and divided with an endoscopic vascular stapler.  There were 2 large, relatively separate left upper lobe branches arising from the pulmonary artery.  There was a node between these 2 that was taken out.  This node did not come out intact, but came out in several pieces. After removing this node, these arterial branches were able to be encircled and then divided with an endoscopic vascular stapler.  These branches were done separately due to the relatively wide spacing.  Finally, a posterior branch was noted.  This was divided going through the fissure posteriorly.  Additional nodes from around the bronchus were taken and sent as separate specimens.  Next, a second port incision was made anterior to the first port incision. A stapler was placed through this incision, across the bronchus and closed.  A test inflation showed good aeration of the lower lobe.  The stapler then was fired transecting the left upper lobe bronchus.  The specimen was placed into an endoscopic retrieval bag and removed and sent for frozen section of the mass and margins. The frozen subsequently returned showing non-small cell carcinoma with negative bronchial and vascular  margins.  The pleura overlying the aortopulmonary window was incised and multiple large level 5 nodes were removed.  The lung was retracted anteriorly. The pleura was incised between the hilum and the aorta and a level 7 node was identified and was dissected out and taken for permanent pathology.  The chest was copiously irrigated with warm saline.  A test inflation to 30 cm water revealed no leakage from the bronchial stump. A final inspection was made for hemostasis.  A 28-French Blake drain was placed in the original port incision and directed posteriorly and a 28- French chest tube was directed through the second port incision and directed anteriorly.  The small utility incision was closed with a #1 Vicryl fascial suture, a 2-0 Vicryl subcutaneous suture and a 3-0 Vicryl subcuticular suture.  The chest tubes were placed to suction.  The patient was placed back in a supine position.  She was extubated in the operating room and taken to the postanesthetic care unit in good condition.     Revonda Standard Roxan Hockey, M.D.  SCH/MEDQ  D:  05/04/2014  T:  05/05/2014  Job:  [id number]

## 2014-05-05 NOTE — Progress Notes (Signed)
NUTRITION BRIEF NOTE:  Pt identified due to Malnutrition Screening Tool  Wt Readings from Last 10 Encounters:  05/04/14 179 lb 14.3 oz (81.6 kg)  04/18/14 180 lb (81.647 kg)  03/31/14 181 lb (82.101 kg)  03/22/14 180 lb 12.8 oz (82.01 kg)  06/14/12 203 lb (92.08 kg)   Body mass index is 32.89 kg/(m^2). Pt meets criteria for Obesity Class I based on current BMI.  Current diet order is CHO Modified, and PO intake is >90%. Pt denied any nausea, vomiting, abdominal pain or diarrhea. She reported having a good appetite, and did not want to try any nutrition supplements. Labs and medications reviewed.  NFPE did not reveal any subcutaneous body fat or muscle mass depletion.  No nutrition intervention warranted at this time. If any nutrition issues arise, please consult RD.   Wynona Dove, MS Dietetic Intern Pager: [phone]

## 2014-05-06 ENCOUNTER — Inpatient Hospital Stay (HOSPITAL_COMMUNITY): Payer: Medicare HMO

## 2014-05-06 LAB — COMPREHENSIVE METABOLIC PANEL
ALBUMIN: 2.4 g/dL — AB (ref 3.5–5.2)
ALT: 16 U/L (ref 0–35)
AST: 17 U/L (ref 0–37)
Alkaline Phosphatase: 69 U/L (ref 39–117)
Anion gap: 5 (ref 5–15)
BUN: 9 mg/dL (ref 6–23)
CALCIUM: 8.2 mg/dL — AB (ref 8.4–10.5)
CO2: 26 mmol/L (ref 19–32)
Chloride: 109 mmol/L (ref 96–112)
Creatinine, Ser: 0.76 mg/dL (ref 0.50–1.10)
GFR calc Af Amer: >90 mL/min (ref >90)
GFR calc non Af Amer: 79 mL/min — ABNORMAL LOW (ref >90)
Glucose, Bld: 135 mg/dL — ABNORMAL HIGH (ref 70–99)
Potassium: 3.8 mmol/L (ref 3.5–5.1)
Sodium: 140 mmol/L (ref 135–145)
Total Bilirubin: 0.4 mg/dL (ref 0.3–1.2)
Total Protein: 5.7 g/dL — ABNORMAL LOW (ref 6.0–8.3)

## 2014-05-06 LAB — GLUCOSE, CAPILLARY
GLUCOSE-CAPILLARY: 129 mg/dL — AB (ref 70–99)
Glucose-Capillary: 139 mg/dL — ABNORMAL HIGH (ref 70–99)
Glucose-Capillary: 135 mg/dL — ABNORMAL HIGH (ref 70–99)
Glucose-Capillary: 115 mg/dL — ABNORMAL HIGH (ref 70–99)
Glucose-Capillary: 109 mg/dL — ABNORMAL HIGH (ref 70–99)

## 2014-05-06 LAB — CBC
HCT: 33.8 % — ABNORMAL LOW (ref 36.0–46.0)
Hemoglobin: 11.2 g/dL — ABNORMAL LOW (ref 12.0–15.0)
MCH: 27.5 pg (ref 26.0–34.0)
MCHC: 33.1 g/dL (ref 30.0–36.0)
MCV: 83 fL (ref 78.0–100.0)
Platelets: 304 10*3/uL (ref 150–400)
RBC: 4.07 MIL/uL (ref 3.87–5.11)
RDW: 13.9 % (ref 11.5–15.5)
WBC: 12.4 10*3/uL — AB (ref 4.0–10.5)

## 2014-05-06 MED ORDER — METOPROLOL TARTRATE 1 MG/ML IV SOLN
10.0000 mg | INTRAVENOUS | Status: DC | PRN
  Administered 2014-05-06 (×2): 10 mg via INTRAVENOUS
  Filled 2014-05-06 (×2): qty 10

## 2014-05-06 MED ORDER — HYDROCHLOROTHIAZIDE 25 MG PO TABS
25.0000 mg | ORAL_TABLET | Freq: Every day | ORAL | Status: DC
  Administered 2014-05-06 – 2014-05-10 (×5): 25 mg via ORAL
  Filled 2014-05-06 (×5): qty 1

## 2014-05-06 MED ORDER — POTASSIUM CHLORIDE CRYS ER 20 MEQ PO TBCR
20.0000 meq | EXTENDED_RELEASE_TABLET | Freq: Two times a day (BID) | ORAL | Status: AC
  Administered 2014-05-06 (×2): 20 meq via ORAL
  Filled 2014-05-06 (×2): qty 1

## 2014-05-06 MED ORDER — ZOLPIDEM TARTRATE 5 MG PO TABS
5.0000 mg | ORAL_TABLET | Freq: Every evening | ORAL | Status: DC | PRN
Start: 2014-05-06 — End: 2014-05-10

## 2014-05-06 MED ORDER — VALSARTAN-HYDROCHLOROTHIAZIDE 160-25 MG PO TABS: 1.0000 | ORAL_TABLET | ORAL | Status: DC

## 2014-05-06 MED ORDER — IRBESARTAN 150 MG PO TABS
150.0000 mg | ORAL_TABLET | Freq: Every day | ORAL | Status: DC
  Administered 2014-05-06 – 2014-05-10 (×5): 150 mg via ORAL
  Filled 2014-05-06 (×5): qty 1

## 2014-05-06 NOTE — Progress Notes (Signed)
      [address]       [place], [address]             [phone]      Still concerned about her BP  BP 123/86 mmHg  Pulse 73  Temp(Src) 97.6 F (36.4 C) (Oral)  Resp 22  Ht 5\' 2"  (1.575 m)  Wt 179 lb 14.3 oz (81.6 kg)  BMI 32.89 kg/m2  SpO2 97%   Intake/Output Summary (Last 24 hours) at 05/06/14 1754 Last data filed at 05/06/14 1700  Gross per 24 hour  Intake   1870 ml  Output   3040 ml  Net  -1170 ml    Looks good, awaiting a step down bed  [employer]. Roxan Hockey, MD [facility] [phone]

## 2014-05-06 NOTE — Progress Notes (Signed)
2 Days Post-Op Procedure(s) (LRB): LEFT VIDEO ASSISTED THORACOSCOPY (VATS)/LEFT UPPER LOBECTOMY (Left) CRYO INTERCOSTAL NERVE BLOCK (Left) NODE DISSECTION (Left) Subjective: "I had a terrible night"- c/o lack of sleep and high BP  Objective: Vital signs in last 24 hours: Temp:  [97.8 F (36.6 C)-98.5 F (36.9 C)] 97.8 F (36.6 C) (03/26 0700) Pulse Rate:  [49-88] 65 (03/26 0700) Cardiac Rhythm:  [-] Normal sinus rhythm (03/26 0400) Resp:  [12-31] 19 (03/26 0800) BP: (105-182)/(45-94) 124/64 mmHg (03/26 0700) SpO2:  [92 %-100 %] 98 % (03/26 0806)  Hemodynamic parameters for last 24 hours:    Intake/Output from previous day: 03/25 0701 - 03/26 0700 In: 2412.5 [P.O.:1200; I.V.:1162.5; IV Piggyback:50] Out: 6301 [Urine:2620; Chest Tube:570] Intake/Output this shift:    General appearance: alert and no distress Neurologic: intact Heart: regular rate and rhythm Lungs: diminished breath sounds left base Abdomen: normal findings: soft, non-tender small air leak  Lab Results:  Recent Labs  05/05/14 0530 05/06/14 0420  WBC 11.0* 12.4*  HGB 10.7* 11.2*  HCT 32.1* 33.8*  PLT 268 304   BMET:  Recent Labs  05/05/14 0530 05/06/14 0420  NA 137 140  K 4.0 3.8  CL 107 109  CO2 24 26  GLUCOSE 106* 135*  BUN 8 9  CREATININE 0.73 0.76  CALCIUM 7.8* 8.2*    PT/INR: No results for input(s): LABPROT, INR in the last 72 hours. ABG    Component Value Date/Time   PHART 7.391 05/05/2014 0523   HCO3 24.0 05/05/2014 0523   TCO2 25 05/05/2014 0523   ACIDBASEDEF 1.0 05/05/2014 0523   O2SAT 85.0 05/05/2014 0523   CBG (last 3)   Recent Labs  05/05/14 1106 05/05/14 1659 05/05/14 2207  GLUCAP 123* 126* 139*    Assessment/Plan: S/P Procedure(s) (LRB): LEFT VIDEO ASSISTED THORACOSCOPY (VATS)/LEFT UPPER LOBECTOMY (Left) CRYO INTERCOSTAL NERVE BLOCK (Left) NODE DISSECTION (Left) -  POD # 2  CV- hypertensive overnight. Continue metoprolol , restart Diovan  RESP- s/p  lobectomy, still has an air leak- small space on chest x ray today- continue suction today  Will try on water seal tomorrow  RENAL_ creatinine OK, supplement K  ENDO- CBG well controlled  Anemia secondary to ABL- follow  Continue ambulation  SCD + enoxaparin for DVT prophylaxis  transfer to 3S when bed available   LOS: 2 days    [name] [name] 05/06/2014

## 2014-05-06 NOTE — Progress Notes (Signed)
Pt's BP 180s/70s. MD notified. Order received for Metoprolol. Will give and continue to monitor.  Vella Raring, RN

## 2014-05-06 NOTE — Progress Notes (Signed)
CRITICAL VALUE ALERT  Critical value received: New small left sided pneumothorax measuring 20%  Date of notification: 05/06/14  Time of notification: 0700  Critical value read back: yes  Nurse who received alert: Vella Raring, RN  MD notified (1st page): [name]  Time of first page: [phone]  MD notified (2nd page):  Time of second page:  Responding MD: [name]  Time MD responded: [phone]

## 2014-05-07 ENCOUNTER — Inpatient Hospital Stay (HOSPITAL_COMMUNITY): Payer: Medicare HMO

## 2014-05-07 LAB — GLUCOSE, CAPILLARY
GLUCOSE-CAPILLARY: 135 mg/dL — AB (ref 70–99)
GLUCOSE-CAPILLARY: 116 mg/dL — AB (ref 70–99)
Glucose-Capillary: 78 mg/dL (ref 70–99)
Glucose-Capillary: 105 mg/dL — ABNORMAL HIGH (ref 70–99)

## 2014-05-07 LAB — BASIC METABOLIC PANEL
Anion gap: 7 (ref 5–15)
BUN: 10 mg/dL (ref 6–23)
CHLORIDE: 110 mmol/L (ref 96–112)
CO2: 24 mmol/L (ref 19–32)
Calcium: 8.7 mg/dL (ref 8.4–10.5)
Creatinine, Ser: 0.77 mg/dL (ref 0.50–1.10)
GFR calc Af Amer: >90 mL/min (ref >90)
GFR, EST NON AFRICAN AMERICAN: 78 mL/min — AB (ref >90)
GLUCOSE: 119 mg/dL — AB (ref 70–99)
POTASSIUM: 3.8 mmol/L (ref 3.5–5.1)
Sodium: 141 mmol/L (ref 135–145)

## 2014-05-07 MED ORDER — ONDANSETRON HCL 4 MG/2ML IJ SOLN
4.0000 mg | Freq: Four times a day (QID) | INTRAMUSCULAR | Status: DC | PRN
  Administered 2014-05-08: 4 mg via INTRAVENOUS
  Filled 2014-05-07: qty 2

## 2014-05-07 MED ORDER — ALBUTEROL SULFATE (2.5 MG/3ML) 0.083% IN NEBU
2.5000 mg | INHALATION_SOLUTION | Freq: Three times a day (TID) | RESPIRATORY_TRACT | Status: DC
  Administered 2014-05-07 – 2014-05-08 (×4): 2.5 mg via RESPIRATORY_TRACT
  Filled 2014-05-07 (×4): qty 3

## 2014-05-07 MED ORDER — POTASSIUM CHLORIDE CRYS ER 20 MEQ PO TBCR
20.0000 meq | EXTENDED_RELEASE_TABLET | Freq: Two times a day (BID) | ORAL | Status: AC
  Administered 2014-05-07 – 2014-05-09 (×4): 20 meq via ORAL
  Filled 2014-05-07 (×5): qty 1

## 2014-05-07 NOTE — Progress Notes (Signed)
      [address]       ,[place] [address]             [phone]       No complaints Up in chair A lot of tidal movement but only a small air leak  Remo Lipps C. Roxan Hockey, MD [facility] [phone]

## 2014-05-07 NOTE — Progress Notes (Signed)
3 Days Post-Op Procedure(s) (LRB): LEFT VIDEO ASSISTED THORACOSCOPY (VATS)/LEFT UPPER LOBECTOMY (Left) CRYO INTERCOSTAL NERVE BLOCK (Left) NODE DISSECTION (Left) Subjective: "good and bad"  Objective: Vital signs in last 24 hours: Temp:  [97 F (36.1 C)-98 F (36.7 C)] 97.5 F (36.4 C) (03/27 0757) Pulse Rate:  [62-98] 98 (03/27 0757) Cardiac Rhythm:  [-] Normal sinus rhythm (03/26 2000) Resp:  [18-30] 25 (03/27 0757) BP: (105-160)/(51-86) 125/75 mmHg (03/27 0600) SpO2:  [90 %-100 %] 95 % (03/27 0829) Weight:  [184 lb 15.5 oz (83.9 kg)] 184 lb 15.5 oz (83.9 kg) (03/27 0600)  Hemodynamic parameters for last 24 hours:    Intake/Output from previous day: 03/26 0701 - 03/27 0700 In: 2000 [P.O.:1720; I.V.:280] Out: 2105 [Urine:1725; Chest Tube:380] Intake/Output this shift:    General appearance: alert and no distress Neurologic: intact Heart: regular rate and rhythm Lungs: egophony LLL Wound: ok small air leak, tidal variation with respiration  Lab Results:  Recent Labs  05/05/14 0530 05/06/14 0420  WBC 11.0* 12.4*  HGB 10.7* 11.2*  HCT 32.1* 33.8*  PLT 268 304   BMET:  Recent Labs  05/06/14 0420 05/07/14 0415  NA 140 141  K 3.8 3.8  CL 109 110  CO2 26 24  GLUCOSE 135* 119*  BUN 9 10  CREATININE 0.76 0.77  CALCIUM 8.2* 8.7    PT/INR: No results for input(s): LABPROT, INR in the last 72 hours. ABG    Component Value Date/Time   PHART 7.391 05/05/2014 0523   HCO3 24.0 05/05/2014 0523   TCO2 25 05/05/2014 0523   ACIDBASEDEF 1.0 05/05/2014 0523   O2SAT 85.0 05/05/2014 0523   CBG (last 3)   Recent Labs  05/06/14 1137 05/06/14 1524 05/06/14 2224  GLUCAP 115* 109* 129*    Assessment/Plan: S/P Procedure(s) (LRB): LEFT VIDEO ASSISTED THORACOSCOPY (VATS)/LEFT UPPER LOBECTOMY (Left) CRYO INTERCOSTAL NERVE BLOCK (Left) NODE DISSECTION (Left) Awaiting step down bed  CV- stable, BP improved RESP- will try CT on water seal today RENAL- creatinine  OK, supplement K ENDO- CBG well controlled Ambulating well   LOS: 3 days    [name] 05/07/2014

## 2014-05-08 ENCOUNTER — Inpatient Hospital Stay (HOSPITAL_COMMUNITY): Payer: Medicare HMO

## 2014-05-08 ENCOUNTER — Encounter (HOSPITAL_COMMUNITY): Payer: Self-pay | Admitting: Thoracic Surgery (Cardiothoracic Vascular Surgery)

## 2014-05-08 LAB — TYPE AND SCREEN
ABO/RH(D): O POS
ANTIBODY SCREEN: NEGATIVE
Unit division: 0
Unit division: 0

## 2014-05-08 LAB — GLUCOSE, CAPILLARY
GLUCOSE-CAPILLARY: 112 mg/dL — AB (ref 70–99)
GLUCOSE-CAPILLARY: 116 mg/dL — AB (ref 70–99)

## 2014-05-08 NOTE — Progress Notes (Signed)
Report given to Adriene RN on 2W.  Pt transferred via wheelchair on monitor to 2W34, placed on tele pack.  Care handed over to [place].

## 2014-05-08 NOTE — Progress Notes (Signed)
4 Days Post-Op Procedure(s) (LRB): LEFT VIDEO ASSISTED THORACOSCOPY (VATS)/LEFT UPPER LOBECTOMY (Left) CRYO INTERCOSTAL NERVE BLOCK (Left) NODE DISSECTION (Left) Subjective: C/o pain at CT site  Objective: Vital signs in last 24 hours: Temp:  [97.6 F (36.4 C)-98.5 F (36.9 C)] 97.7 F (36.5 C) (03/28 0729) Pulse Rate:  [61-100] 68 (03/28 0600) Cardiac Rhythm:  [-] Normal sinus rhythm (03/28 0400) Resp:  [17-31] 20 (03/28 0600) BP: (122-156)/(51-81) 134/62 mmHg (03/28 0400) SpO2:  [91 %-100 %] 100 % (03/28 0600)  Hemodynamic parameters for last 24 hours:    Intake/Output from previous day: 03/27 0701 - 03/28 0700 In: 1240 [P.O.:1200; I.V.:40] Out: 2075 [Urine:1875; Chest Tube:200] Intake/Output this shift:    General appearance: alert and no distress Neurologic: intact Heart: regular rate and rhythm Lungs: clear to auscultation bilaterally No air leak- + tidal movement  Lab Results:  Recent Labs  05/06/14 0420  WBC 12.4*  HGB 11.2*  HCT 33.8*  PLT 304   BMET:  Recent Labs  05/06/14 0420 05/07/14 0415  NA 140 141  K 3.8 3.8  CL 109 110  CO2 26 24  GLUCOSE 135* 119*  BUN 9 10  CREATININE 0.76 0.77  CALCIUM 8.2* 8.7    PT/INR: No results for input(s): LABPROT, INR in the last 72 hours. ABG    Component Value Date/Time   PHART 7.391 05/05/2014 0523   HCO3 24.0 05/05/2014 0523   TCO2 25 05/05/2014 0523   ACIDBASEDEF 1.0 05/05/2014 0523   O2SAT 85.0 05/05/2014 0523   CBG (last 3)   Recent Labs  05/07/14 1146 05/07/14 1527 05/07/14 2207  GLUCAP 78 105* 116*    Assessment/Plan: S/P Procedure(s) (LRB): LEFT VIDEO ASSISTED THORACOSCOPY (VATS)/LEFT UPPER LOBECTOMY (Left) CRYO INTERCOSTAL NERVE BLOCK (Left) NODE DISSECTION (Left) Plan for transfer to step-down: see transfer orders  Awaiting step down bed Drainage from CT down- will dc posterior tube today Chest x ray shows an increase in subcutaneous air on left- some of this may be  technique Keep anterior CT on water seal Continue ambulation Path still pending   LOS: 4 days    [name] [name] 05/08/2014

## 2014-05-08 NOTE — Progress Notes (Signed)
Changed left CT site. Moderate Serous drainage noted. Pt tolerated well. Chest tube water seal. Monitoring will continue.

## 2014-05-09 ENCOUNTER — Inpatient Hospital Stay (HOSPITAL_COMMUNITY): Payer: Medicare HMO

## 2014-05-09 LAB — GLUCOSE, CAPILLARY
GLUCOSE-CAPILLARY: 94 mg/dL (ref 70–99)
GLUCOSE-CAPILLARY: 127 mg/dL — AB (ref 70–99)
Glucose-Capillary: 116 mg/dL — ABNORMAL HIGH (ref 70–99)
Glucose-Capillary: 115 mg/dL — ABNORMAL HIGH (ref 70–99)
Glucose-Capillary: 100 mg/dL — ABNORMAL HIGH (ref 70–99)

## 2014-05-09 LAB — CBC
HCT: 35.1 % — ABNORMAL LOW (ref 36.0–46.0)
Hemoglobin: 11.7 g/dL — ABNORMAL LOW (ref 12.0–15.0)
MCH: 27.8 pg (ref 26.0–34.0)
MCHC: 33.3 g/dL (ref 30.0–36.0)
MCV: 83.4 fL (ref 78.0–100.0)
PLATELETS: 339 10*3/uL (ref 150–400)
RBC: 4.21 MIL/uL (ref 3.87–5.11)
RDW: 14.2 % (ref 11.5–15.5)
WBC: 9.1 10*3/uL (ref 4.0–10.5)

## 2014-05-09 LAB — BASIC METABOLIC PANEL
ANION GAP: 3 — AB (ref 5–15)
BUN: 13 mg/dL (ref 6–23)
CHLORIDE: 105 mmol/L (ref 96–112)
CO2: 31 mmol/L (ref 19–32)
Calcium: 8.7 mg/dL (ref 8.4–10.5)
Creatinine, Ser: 0.98 mg/dL (ref 0.50–1.10)
GFR calc non Af Amer: 54 mL/min — ABNORMAL LOW (ref >90)
GFR, EST AFRICAN AMERICAN: 62 mL/min — AB (ref >90)
Glucose, Bld: 115 mg/dL — ABNORMAL HIGH (ref 70–99)
Potassium: 5.4 mmol/L — ABNORMAL HIGH (ref 3.5–5.1)
Sodium: 139 mmol/L (ref 135–145)

## 2014-05-09 MED ORDER — ALBUTEROL SULFATE (2.5 MG/3ML) 0.083% IN NEBU: 2.5000 mg | INHALATION_SOLUTION | Freq: Four times a day (QID) | RESPIRATORY_TRACT | Status: DC | PRN

## 2014-05-09 MED ORDER — MAGNESIUM HYDROXIDE 400 MG/5ML PO SUSP
30.0000 mL | Freq: Once | ORAL | Status: AC
  Administered 2014-05-09: 30 mL via ORAL
  Filled 2014-05-09: qty 30

## 2014-05-09 NOTE — Discharge Summary (Signed)
Physician Discharge Summary       [address]       [place] [address]             [phone]    Patient ID: [name] [name] MRN: [id number] DOB/AGE: [birth year] 79 y.o.  Admit date: 05/04/2014 Discharge date: 05/10/2014  Admission Diagnoses: 1. Left upper lobe mass 2. History of tobacco abuse 3. COPD 4. Hypertension 5. History of palpitations  Discharge Diagnoses:  1. 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 History of tobacco abuse 3. COPD 4. Hypertension 5. History of palpitations  Procedure (s):  Left video-assisted thoracoscopy, thoracoscopic left upper lobectomy, mediastinal lymph node dissection, cryoanalgesia of intercostal nerves 3 through 8 by [name] on 05/04/2014.  Pathology: Diagnosis 1. Lung, resection (segmental or lobe), left upper lobe - INVASIVE MODERATELY TO POORLY DIFFERENTIATED SQUAMOUS CELL CARCINOMA, SPANNING 6 CM IN GREATEST DIMENSION. - FOCAL SUPERFICIAL VISCERAL PLEURAL INVASION IS IDENTIFIED. - MARGINS ARE NEGATIVE. - ONE BENIGN LYMPH NODE WITH NO TUMOR SEEN (0/1). - SEE ONCOLOGY TEMPLATE. 2. Lymph node, biopsy, 10 L - ONE BENIGN LYMPH NODE WITH NO TUMOR SEEN (0/1). 3. Lymph node, biopsy, 11 L - ONE BENIGN LYMPH NODE WITH NO TUMOR SEEN (0/1). 4. Lymph node, biopsy, 11 L #2 - ONE BENIGN LYMPH NODE WITH NO TUMOR SEEN (0/1). 5. Lymph node, biopsy, 11 L #3 - ONE BENIGN LYMPH NODE WITH NO TUMOR SEEN (0/1). 6. Lymph node, biopsy, 11 L#4 - ONE BENIGN LYMPH NODE WITH NO TUMOR SEEN (0/1). 7. Lymph node, biopsy, 11 L #5 - ONE BENIGN LYMPH NODE WITH NO TUMOR SEEN (0/1). 8. Lymph node, biopsy, 11 L #6 - ONE BENIGN LYMPH NODE WITH NO TUMOR SEEN (0/1). 9. Lymph node, biopsy, 11 L #7 - BENIGN LUNG TISSUE. - NO ATYPIA OR MALIGNANCY IDENTIFIED. - LYMPH NODE TISSUE IS NOT IDENTIFIED. 10. Lymph node, biopsy, 5 node - BENIGN FIBROFATTY SOFT TISSUE AND HEMORRHAGE. - NO ATYPIA OR MALIGNANCY IDENTIFIED. -  LYMPH NODE TISSUE IS NOT IDENTIFIED. 11. Lymph node, biopsy, 5 node #2 1 of 4 FINAL for [name] [name] ([id number]) Diagnosis(continued) - ONE BENIGN LYMPH NODE WITH NO TUMOR SEEN (0/1). 12. Lymph node, biopsy, 5 node #3 - ONE BENIGN LYMPH NODE WITH NO TUMOR SEEN (0/1). 13. Lymph node, biopsy, 10 L #2 - ONE BENIGN LYMPH NODE WITH NO TUMOR SEEN (0/1). 14. Lymph node, biopsy, 7 node - ONE BENIGN LYMPH NODE WITH NO TUMOR SEEN (0/1). 15. Lymph node, biopsy, 10 L #3 - ONE BENIGN LYMPH NODE WITH NO TUMOR SEEN (0/1). TNM Code: pT2b, pN0.  History of Presenting Illness: This is a 81 year old woman with a left upper lobe mass was on the office 2 weeks ago. She was uncertain as to how she would like to proceed with treatment. She had a brain MR and now returns to discuss the results of that study as well as decide how to proceed with treatment of her lung mass.  She has been very anxious about her results.  She is a poor historian and complained of difficulty walking. It was unclear if this was due to hip pain or shortness of breath. I walked with her around the upper level of our office building to the elevators and back. She did not have any shortness of breath. She did complain of hip pain.   I reviewed her notes from Dr. Irven Shelling office. Her echocardiogram showed left ventricular hypertrophy with grade 1 diastolic dysfunction. Ejection fraction  was 57%. There was trace tricuspid regurgitation, no evidence of pulmonary hypertension. ABIs were 0.45 on the right and 0.89 on the left. Her nuclear stress study was nondiagnostic for ischemia. There was prominent breast attenuation artifact in the inferior wall without demonstrable ischemia. This was interpreted as a low risk study  03/31/2014 [name] [name] is a 81 year old woman who has been in generally good health most of her life. She is a very poor historian. She has a long history of tobacco abuse having started smoking at age 67. She smoked just less  than a pack a day for her adult life. She still smokes less than half a pack of cigarettes daily.  She recently saw Dr. Einar Gip apparently for workup of shortness of breath with exertion. A chest x-ray showed a lung mass. That led to a CT scan which showed a 4 cm left upper lobe mass as well as extensive emphysematous changes in the upper lobes bilaterally. There was no obvious adenopathy.  She was referred to Dr. Melvyn Novas. He recommended a PET/CT. The primary mass was markedly hypermetabolic with an SUV of 27. It was adjacent to the chest wall but no definite evidence of invasion. There was no evidence of nodal involvement or distant metastases.  She says that she was having a lot of breathing issues back in November. She had a cough and was treated with antibiotics. She says that she now does not short of breath with walking. She walks up a flight of stairs to get into her apartment. She says that her activities are mostly limited by right hip pain and back pain. She says that Dr. Einar Gip found that she has peripheral arterial disease.  She is on metformin for "prediabetes". She denies chest pain, pressure or tightness when asked directly about them. She has had a cough recently. She has not had any hemoptysis. She says her appetite is been poor and she's lost about 20 pounds over the past 3 months. She's not had any unusual headaches. She recently had cataract surgery and has had some blurred vision since. She denies wheezing and says she has not had to use any inhalers  [name] discussed the need for a left VATS, possible left upper lobectomy, and cryo-analgesia. Potential risks, complications, and benefits of the surgery were discussed with the patient and she agreed to proceed with surgery. She underwent a left VATS, thoracoscopic LUL, and cryo-analgesia on 05/04/2014.  Brief Hospital Course:  Patient remained afebrile and hemodynamically stable. A line and foley were removed early in post  operative course. Chest tube output gradually decreased. Daily chest x rays were obtained. There was a small air leak, which did resolve. Posterior chest tube was removed on 05/08/2014. Remaining chest tube was removed on 05/09/2014. She is ambulating on room air. She is tolerating a diet. She is constipated and requested MOM, which was given. CXR today showed stable, tiny left apical pneumothorax and stable,subcutaneous emphysema left lateral chest wall. She had hyperkalemia on 03/29 of 5.4. Repeat potassium this am is down 4.3. Patient instructed to apply dry 4x4s or ABD pad with tape to chest tube sutures sites. The serous drainage should stop within several days. If it does not or she develops fever or chills, she is to contact office asap.  Latest Vital Signs: Blood pressure 124/54, pulse 79, temperature 98.3 F (36.8 C), temperature source Oral, resp. rate 19, height 5\' 2"  (1.575 m), weight 184 lb 15.5 oz (83.9 kg), SpO2 90 %.  Physical Exam:  Cardiovascular: RRR Pulmonary: Slightly diminished on left and clear on right Abdomen: Soft, non tender, bowel sounds present. Extremities: Trace bilateral lower extremity edema. Wounds: Clean and dry. Chest tube dressing with sero sanguinous output. Sutures are tight on skin, but draining in corners. No erythema.  Discharge Condition: Stable and discharged to home  Recent laboratory studies:  Lab Results  Component Value Date   WBC 9.1 05/09/2014   HGB 11.7* 05/09/2014   HCT 35.1* 05/09/2014   MCV 83.4 05/09/2014   PLT 339 05/09/2014   Lab Results  Component Value Date   NA 139 05/09/2014   K 4.3 05/10/2014   CL 105 05/09/2014   CO2 31 05/09/2014   CREATININE 0.98 05/09/2014   GLUCOSE 115* 05/09/2014      Diagnostic Studies: Dg Chest 2 View  05/09/2014   CLINICAL DATA:  Chest pain.  Status post left lobectomy  EXAM: CHEST  2 VIEW  COMPARISON:  May 08, 2014  FINDINGS: The more inferior chest tube has been removed compared to 1 day  prior. It is chest tube remains in place. There is a trace pneumothorax in the left apical region, smaller compared to 1 day prior. There is subcutaneous air on the left, grossly stable.  There is a small area of focal consolidation in the left base. Lungs elsewhere clear. Heart size and pulmonary vascularity are normal. There is atherosclerotic change in the aorta.  IMPRESSION: One of the chest tubes has been removed. Trace pneumothorax left apex. Subcutaneous air on left remains. Focal consolidation left base region remains. Elsewhere lungs clear. No change in cardiac silhouette.   Electronically Signed   By: Lowella Grip III M.D.   On: 05/09/2014 07:46   Mr Jeri Cos FA Contrast  04/17/2014   CLINICAL DATA:  81 year old female with new diagnosis of lung cancer. Initial screening evaluation for possibility of intracranial metastatic disease. History of hypertension. Initial encounter.  EXAM: MRI HEAD WITHOUT AND WITH CONTRAST  TECHNIQUE: Multiplanar, multiecho pulse sequences of the brain and surrounding structures were obtained without and with intravenous contrast.  CONTRAST:  20mL MULTIHANCE GADOBENATE DIMEGLUMINE 529 MG/ML IV SOLN  COMPARISON:  None.  FINDINGS: No acute infarct.  No intracranial hemorrhage.  No intracranial enhancing lesion or bony destructive lesion to suggest presence of intracranial metastatic disease.  Small vessel disease type changes.  Expanded partially empty sella.  No other findings of pseudotumor.  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  Atrophy without hydrocephalus.  Major intracranial vascular structures are patent.  Cervical spondylotic changes.  IMPRESSION: No evidence of intracranial metastatic disease.  Atrophy.  Small vessel disease type changes.  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  Expanded partially empty sella without other findings of pseudotumor.  Cervical spondylotic changes upper cervical spine.    Electronically Signe  Discharge Medications:   Medication List    TAKE these medications        ALPRAZolam 0.5 MG tablet  Commonly known as:  XANAX  Take 0.5 mg by mouth 3 (three) times daily as needed for anxiety.     aspirin 81 MG tablet  Take 81 mg by mouth daily.     atorvastatin 10 MG tablet  Commonly known as:  LIPITOR  Take 10 mg by mouth daily.     budesonide-formoterol 80-4.5 MCG/ACT inhaler  Commonly known as:  SYMBICORT  Inhale 2 puffs into the lungs 2 (two) times daily as needed (Shortness of Breath).  buPROPion 150 MG 12 hr tablet  Commonly known as:  WELLBUTRIN SR  Take 150 mg by mouth every 12 (twelve) hours.     carboxymethylcellulose 0.5 % Soln  Commonly known as:  REFRESH PLUS  Place 1 drop into both eyes daily as needed.     Fluticasone-Salmeterol 250-50 MCG/DOSE Aepb  Commonly known as:  ADVAIR  Inhale 1 puff into the lungs 2 (two) times daily as needed (Shortness of Breath).     loteprednol 0.2 % Susp  Commonly known as:  LOTEMAX  Apply 1 drop to eye 2 (two) times daily. For 14 days     metFORMIN 500 MG tablet  Commonly known as:  GLUCOPHAGE  Take 500 mg by mouth 2 (two) times daily with a meal.     metoprolol tartrate 25 MG tablet  Commonly known as:  LOPRESSOR  Take 25 mg by mouth 2 (two) times daily.     naproxen sodium 220 MG tablet  Commonly known as:  ANAPROX  Take 220 mg by mouth 2 (two) times daily as needed (pain).     oxyCODONE 5 MG immediate release tablet  Commonly known as:  Oxy IR/ROXICODONE  Take 1 tablet (5 mg total) by mouth every 4 (four) hours as needed for severe pain.     SYSTANE BALANCE OP  Apply 1 drop to eye daily as needed (dry eyes).     valsartan-hydrochlorothiazide 160-25 MG per tablet  Commonly known as:  DIOVAN-HCT  Take 1 tablet by mouth every morning.        Follow Up Appointments: Follow-up Information    Follow up with [name], MD On 05/23/2014.   Specialty:  Cardiothoracic  Surgery   Why:  PA/LAT CXR to be taken (at [facility] which is in the same building as Dr. Leonarda Salon office) on 05/23/2014 at 9:00 am;Appointment with [name] is at 10:00 am   Contact information:   [address] [place] [address] [phone]       Signed: Lars Pinks MPA-C 05/10/2014, 3:39 PM

## 2014-05-09 NOTE — Progress Notes (Addendum)
Nursing note Patient Left CT removed as ordered pressure dressing applied. Will monitor patient. Brynnan Rodenbaugh, Bettina Gavia RN

## 2014-05-09 NOTE — Progress Notes (Signed)
Serosanguinous  Drainage from chest tube site, dressing changed and ABD and gauze applied. Will monitor patient. Imojean Yoshino, Bettina Gavia RN

## 2014-05-09 NOTE — Progress Notes (Addendum)
      [address]       [place] [address]             [phone]       5 Days Post-Op Procedure(s) (LRB): LEFT VIDEO ASSISTED THORACOSCOPY (VATS)/LEFT UPPER LOBECTOMY (Left) CRYO INTERCOSTAL NERVE BLOCK (Left) NODE DISSECTION (Left)  Subjective: Patient with complaints of pain at CT site and constipation.  Objective: Vital signs in last 24 hours: Temp:  [97.5 F (36.4 C)-98.9 F (37.2 C)] 98.9 F (37.2 C) (03/29 0342) Pulse Rate:  [70-89] 86 (03/29 0342) Cardiac Rhythm:  [-] Normal sinus rhythm (03/28 2043) Resp:  [16-18] 16 (03/29 0342) BP: (122-143)/(59-74) 131/59 mmHg (03/29 0342) SpO2:  [90 %-100 %] 90 % (03/29 0342)     Intake/Output from previous day: 03/28 0701 - 03/29 0700 In: 600 [P.O.:600] Out: 1980 [Urine:1900; Chest Tube:80]   Physical Exam:  Cardiovascular: RRR Pulmonary: Diminished throughout Abdomen: Soft, non tender, bowel sounds present. Extremities: Mild bilateral lower extremity edema. Wounds: Clean and dry.  Chest tube dressing with sero sanguinous output. Chest Tube: Chest tube is to water seal No air leak.   Lab Results: CBC: Recent Labs  05/09/14 0453  WBC 9.1  HGB 11.7*  HCT 35.1*  PLT 339   BMET:  Recent Labs  05/07/14 0415 05/09/14 0453  NA 141 139  K 3.8 5.4*  CL 110 105  CO2 24 31  GLUCOSE 119* 115*  BUN 10 13  CREATININE 0.77 0.98  CALCIUM 8.7 8.7    PT/INR: No results for input(s): LABPROT, INR in the last 72 hours. ABG:  INR: Will add last result for INR, ABG once components are confirmed Will add last 4 CBG results once components are confirmed  Assessment/Plan:  1. CV - SR. On Lopressor 25 mg bid, Avapro 150 mg daily, and HCTZ 25 mg daily. 2.  Pulmonary - Chest tube output 30 cc last 12 hours. Chest tube is to water seal. There is no air leak. CXR shows trace left apical pneumothorax, subcutaneous emphysema on left, and consolidation left base. Likely remove remaining chest tube.  Encourage incentive spirometer. Pathology shows:INVASIVE MODERATELY TO POORLY DIFFERENTIATED SQUAMOUS CELL CARCINOMA. TNM code: pT2b, pN0. Not discussed with patient as [name] will discuss. 3. Hyperkalemia-stop supplementation. On Avapro. Re check potassium in am 4. Constipation-requesting MOM so will order 5. Possibly home in am  ZIMMERMAN,DONIELLE MPA-C 05/09/2014,7:50 AM Feel much better after CT removed Will recheck CXR in AM- if OK home tomorrow Path T2bN0- d/w patient  Revonda Standard. Roxan Hockey, MD [facility] [phone]

## 2014-05-09 NOTE — Progress Notes (Signed)
Pt requesting medication for constipation, milk of magnesia given as ordered, will continue to monitor patient. Darryll Raju, Bettina Gavia RN

## 2014-05-09 NOTE — Progress Notes (Signed)
Patient up to chair x1 assist. Callbell within reach will monitor patient. [name] [name], Bettina Gavia RN

## 2014-05-10 ENCOUNTER — Inpatient Hospital Stay (HOSPITAL_COMMUNITY): Payer: Medicare HMO

## 2014-05-10 LAB — GLUCOSE, CAPILLARY: Glucose-Capillary: 126 mg/dL — ABNORMAL HIGH (ref 70–99)

## 2014-05-10 LAB — POTASSIUM: POTASSIUM: 4.3 mmol/L (ref 3.5–5.1)

## 2014-05-10 MED ORDER — OXYCODONE HCL 5 MG PO TABS: 5.0000 mg | ORAL_TABLET | ORAL | Status: DC | PRN

## 2014-05-10 MED ORDER — METFORMIN HCL 500 MG PO TABS
500.0000 mg | ORAL_TABLET | Freq: Two times a day (BID) | ORAL | Status: DC
  Administered 2014-05-10: 500 mg via ORAL
  Filled 2014-05-10 (×3): qty 1

## 2014-05-10 MED FILL — Medication: Qty: 1 | Status: AC

## 2014-05-10 NOTE — Discharge Instructions (Signed)
ACTIVITY:  1.Increase activity slowly. 2.Walk daily and increase frequency and duration as tolerates. 3.May walk up steps. 4.No lifting more than ten pounds for two weeks. 5.Avoid straining. 6.STOP any activity that causes chest pain, shortness of breath, dizziness,sweating,     or excessive weakness. 7.Continue with breathing exercises daily.  DIET:  Diabetic diet and Low fat, Low salt  diet   WOUND:  1.May shower. 2. Apply ABD or dry 4x4s to left chest tube sites and then cover with tape. Change as needed. If drainage doesn't stop in a several days, call office. 3.Clean wounds with mild soap and water.  Call the office at [phone] if any problems arise.  Thoracoscopy Care After Refer to this sheet in the next few weeks. These discharge instructions provide you with general information on caring for yourself after you leave the hospital. Your caregiver may also give you specific instructions. Your treatment has been planned according to the most current medical practices available, but unavoidable complications sometimes occur. If you have any problems or questions after discharge, call your caregiver. HOME CARE INSTRUCTIONS   Remove the bandage (dressing) over your chest tube site as directed by your caregiver.  It is normal to be sore for a couple weeks following surgery. See your caregiver if this seems to be getting worse rather than better.  Only take over-the-counter or prescription medicines for pain, discomfort, or fever as directed by your caregiver. It is very important to take pain medicine when you need it so that you will cough and breathe deeply enough to clear mucus (phlegm) and expand your lungs.  If it hurts to cough, hold a pillow against your chest when you cough. This may help with the discomfort. In spite of the discomfort, cough frequently, as this helps protect against getting an infection in your lung (pneumonia).  Taking deep breaths keeps lungs inflated  and protects against pneumonia. Most patients will go home with an incentive spirometer that encourages deep breathing.  You may resume a normal diet and activities as directed.  Use showers for bathing until you see your caregiver, or as instructed.  Change dressings if necessary or as directed.  Avoid lifting or driving until you are instructed otherwise.  Make an appointment to see your caregiver for stitch (suture) or staple removal when instructed.  Do not travel by airplane for 2 weeks after the chest tube is removed. SEEK MEDICAL CARE IF:   You are bleeding from your wounds.  You have redness, swelling, or increasing pain in the wounds.  Your heartbeat feels irregular or very fast.  There is pus coming from your wounds.  There is a bad smell coming from the wound or dressing. SEEK IMMEDIATE MEDICAL CARE IF:   You have a fever.  You develop a rash.  You have difficulty breathing.  You develop any reaction or side effects to medicines given.  You develop lightheadedness or feel faint.  You develop shortness of breath or chest pain. MAKE SURE YOU:   Understand these instructions.  Will watch your condition.  Will get help right away if you are not doing well or get worse. Document Released: 08/16/2004 Document Revised: 04/21/2011 Document Reviewed: 07/17/2010 [facility] Patient Information 2015 [place], [place]. This information is not intended to replace advice given to you by your health care provider. Make sure you discuss any questions you have with your health care provider.

## 2014-05-10 NOTE — Progress Notes (Addendum)
      [address]       ,[place] [address]             [phone]       6 Days Post-Op Procedure(s) (LRB): LEFT VIDEO ASSISTED THORACOSCOPY (VATS)/LEFT UPPER LOBECTOMY (Left) CRYO INTERCOSTAL NERVE BLOCK (Left) NODE DISSECTION (Left)  Subjective: Patient with a bowel movement yesterday and wanting to go home this am.  Objective: Vital signs in last 24 hours: Temp:  [97.7 F (36.5 C)-98.3 F (36.8 C)] 98.3 F (36.8 C) (03/30 0453) Pulse Rate:  [69-79] 79 (03/30 0453) Cardiac Rhythm:  [-] Normal sinus rhythm (03/29 2000) Resp:  [18-20] 19 (03/30 0453) BP: (113-124)/(54-60) 124/54 mmHg (03/30 0453) SpO2:  [90 %-94 %] 90 % (03/30 0453)     Intake/Output from previous day: 03/29 0701 - 03/30 0700 In: 600 [P.O.:600] Out: 200 [Urine:200]   Physical Exam:  Cardiovascular: RRR Pulmonary: Slightly diminished on left and clear on right Abdomen: Soft, non tender, bowel sounds present. Extremities: Trace bilateral lower extremity edema. Wounds: Clean and dry.  Chest tube dressing with serous output. Sutures are tight on skin, but draining in corners. No erythema.   Lab Results: CBC:  Recent Labs  05/09/14 0453  WBC 9.1  HGB 11.7*  HCT 35.1*  PLT 339   BMET:   Recent Labs  05/09/14 0453  NA 139  K 5.4*  CL 105  CO2 31  GLUCOSE 115*  BUN 13  CREATININE 0.98  CALCIUM 8.7    PT/INR: No results for input(s): LABPROT, INR in the last 72 hours. ABG:  INR: Will add last result for INR, ABG once components are confirmed Will add last 4 CBG results once components are confirmed  Assessment/Plan:  1. CV - SR. On Lopressor 25 mg bid, Avapro 150 mg daily, and HCTZ 25 mg daily. 2.  Pulmonary -On room air. CXR appears stable (trace left apical pneumothorax and subcutaneous emphysema left lateral chest wall). Will await radiology interpretation. Encourage incentive spirometer.  3. Hyperkalemia-await potassium  4. Dm-CBGs 127/100/126. On Insulin  PRN . Will restart Metformin as taken pre op. 5. Likely discharge today  Yides Saidi MPA-C 05/10/2014,7:30 AM

## 2014-05-10 NOTE — Progress Notes (Signed)
Serosanguinous Drainage from chest tube site, dressing changed and ABD and gauze applied. Will continue to monitor.   Domingo Dimes RN

## 2014-05-10 NOTE — Care Management Note (Signed)
    Page 1 of 1   05/10/2014     11:02:45 AM CARE MANAGEMENT NOTE 05/10/2014  Patient:  Belko,Winefred   Account Number:  [id number]  Date Initiated:  05/08/2014  Documentation initiated by:  Luz Lex  Subjective/Objective Assessment:   Post op VATS     Action/Plan:   Anticipated DC Date:  05/11/2014   Anticipated DC Plan:  [place]  CM consult      Choice offered to / List presented to:             Status of service:  Completed, signed off Medicare Important Message given?  YES (If response is "NO", the following Medicare IM given date fields will be blank) Date Medicare IM given:  05/08/2014 Medicare IM given by:  [facility] Date Additional Medicare IM given:  05/10/2014 Additional Medicare IM given by:  Marvetta Gibbons  Discharge Disposition:  HOME/SELF CARE  Per UR Regulation:  Reviewed for med. necessity/level of care/duration of stay  If discussed at [place] of Stay Meetings, dates discussed:    Comments:  05-08-14 Lajuan Lines, [place] [phone] Lives with daughter.  Another daughter lives down the hall and then has one more that checks in on her.  Daughter she lives with does not work and will be home with her 24/7 on discharge.  Still has one CT with an air leak.

## 2014-05-22 ENCOUNTER — Other Ambulatory Visit: Payer: Self-pay | Admitting: Thoracic Surgery (Cardiothoracic Vascular Surgery)

## 2014-05-22 DIAGNOSIS — I739 Peripheral vascular disease, unspecified: Secondary | ICD-10-CM

## 2014-05-23 ENCOUNTER — Ambulatory Visit
Admission: RE | Admit: 2014-05-23 | Discharge: 2014-05-23 | Disposition: A | Discharge status: Home / Self Care | Payer: Medicare HMO | Source: Ambulatory Visit | Attending: Thoracic Surgery (Cardiothoracic Vascular Surgery) | Admitting: Thoracic Surgery (Cardiothoracic Vascular Surgery)

## 2014-05-23 ENCOUNTER — Telehealth: Payer: Self-pay | Admitting: *Deleted

## 2014-05-23 ENCOUNTER — Encounter: Payer: Self-pay | Admitting: Thoracic Surgery (Cardiothoracic Vascular Surgery)

## 2014-05-23 ENCOUNTER — Ambulatory Visit (INDEPENDENT_AMBULATORY_CARE_PROVIDER_SITE_OTHER): Payer: Self-pay | Admitting: Thoracic Surgery (Cardiothoracic Vascular Surgery)

## 2014-05-23 ENCOUNTER — Other Ambulatory Visit: Payer: Self-pay | Admitting: *Deleted

## 2014-05-23 VITALS — BP 153/86 | HR 82 | Resp 16 | Ht 62.0 in | Wt 184.0 lb

## 2014-05-23 DIAGNOSIS — C3412 Malignant neoplasm of upper lobe, left bronchus or lung: Secondary | ICD-10-CM

## 2014-05-23 DIAGNOSIS — Z902 Acquired absence of lung [part of]: Secondary | ICD-10-CM

## 2014-05-23 DIAGNOSIS — Z9889 Other specified postprocedural states: Secondary | ICD-10-CM

## 2014-05-23 DIAGNOSIS — I739 Peripheral vascular disease, unspecified: Secondary | ICD-10-CM

## 2014-05-23 DIAGNOSIS — C3492 Malignant neoplasm of unspecified part of left bronchus or lung: Secondary | ICD-10-CM

## 2014-05-23 HISTORY — DX: Malignant neoplasm of upper lobe, left bronchus or lung: C34.12

## 2014-05-23 NOTE — Telephone Encounter (Signed)
Received referral today.  I called patient to schedule.  She is scheduled to see Dr. Julien Nordmann on 05/31/14 arrive at 1:45.  She verbalized understanding of appt time and place.

## 2014-05-23 NOTE — Progress Notes (Signed)
[address]       [place] [address]             [phone]       HPI: Mrs. Bolin returns today for a scheduled postoperative follow-up visit.  She is a 81 year old woman who had a thoracoscopic left upper lobectomy on 05/04/2014. 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 Her postoperative course was uncomplicated and she was discharged home on postoperative day #6.  Since discharge she is doing well. She does have some incisional discomfort, but she says is not an operative her to take any pain medication. She does notice some shortness of breath with exertion, which is a little more prominent than it was prior to her surgery. She has not smoked since the day before her operation. Her appetite is poor.  Past Medical History  Diagnosis Date  . Palpitations   . SOB (shortness of breath)   . COPD (chronic obstructive pulmonary disease)   . HTN (hypertension)   . Diabetes mellitus without complication   . Anxiety   . Arthritis   . Cancer       Current Outpatient Prescriptions  Medication Sig Dispense Refill  . ALPRAZolam (XANAX) 0.5 MG tablet Take 0.5 mg by mouth 3 (three) times daily as needed for anxiety.    Marland Kitchen aspirin 81 MG tablet Take 81 mg by mouth daily.    Marland Kitchen atorvastatin (LIPITOR) 10 MG tablet Take 10 mg by mouth daily.    . budesonide-formoterol (SYMBICORT) 80-4.5 MCG/ACT inhaler Inhale 2 puffs into the lungs 2 (two) times daily as needed (Shortness of Breath).    Marland Kitchen buPROPion (WELLBUTRIN SR) 150 MG 12 hr tablet Take 150 mg by mouth every 12 (twelve) hours.    . carboxymethylcellulose (REFRESH PLUS) 0.5 % SOLN Place 1 drop into both eyes daily as needed.    . Fluticasone-Salmeterol (ADVAIR) 250-50 MCG/DOSE AEPB Inhale 1 puff into the lungs 2 (two) times daily as needed (Shortness of Breath).    . loteprednol (LOTEMAX) 0.2 % SUSP Apply 1 drop to eye 2 (two) times daily. For 14 days    . metFORMIN (GLUCOPHAGE) 500 MG  tablet Take 500 mg by mouth 2 (two) times daily with a meal.    . metoprolol tartrate (LOPRESSOR) 25 MG tablet Take 25 mg by mouth 2 (two) times daily.    . naproxen sodium (ANAPROX) 220 MG tablet Take 220 mg by mouth 2 (two) times daily as needed (pain).    Marland Kitchen oxyCODONE (OXY IR/ROXICODONE) 5 MG immediate release tablet Take 1 tablet (5 mg total) by mouth every 4 (four) hours as needed for severe pain. 30 tablet 0  . Propylene Glycol (SYSTANE BALANCE OP) Apply 1 drop to eye daily as needed (dry eyes).    . valsartan-hydrochlorothiazide (DIOVAN-HCT) 160-25 MG per tablet Take 1 tablet by mouth every morning.      No current facility-administered medications for this visit.    Physical Exam: BP 153/86 mmHg  Pulse 82  Resp 16  Ht 5\' 2"  (1.575 m)  Wt 184 lb (83.462 kg)  BMI 33.65 kg/m2  SpO2 93% Elderly obese female in no acute distress Alert and oriented 3 with no focal neurologic deficits Lungs with diminished breath sounds at left base, otherwise clear Cardiac regular rate and rhythm normal S1 and S2 Incisions healing well   Diagnostic Tests: CHEST 2 VIEW  COMPARISON: 05/10/2014 and 05/08/2014  FINDINGS: There  is a small loculated left hydro never thorax superiorly and anteriorly. The pleural effusion has diminished. Remaining portion of the left lung is normal. Right lung is clear. Heart size and vascularity are normal. Calcification in the thoracic aorta.  IMPRESSION: Persistent small left hydro pneumothorax. Diminished left effusion.  Impression: 81 year old woman who is now about 3 weeks post-thoracoscopic left upper lobectomy. She is doing extremely well, and better than expected. She does have some discomfort, but it is relatively minor. I reassured her that poor appetite is a common complaint in the early weeks after surgery and should improve over the next several weeks. Her exercise capacity should improve with time.  She has a stage IIA lesion due to the tumor  being T2b. She does need to be evaluated for adjuvant chemotherapy. She is reluctant to consider chemotherapy, but is willing to talk to the oncologist.  Plan: Referral to Dr. Julien Nordmann in [facility]  I will see her back in 2 months with a PA and lateral chest x-ray   [name], MD [facility] [phone]

## 2014-05-31 ENCOUNTER — Other Ambulatory Visit: Payer: Self-pay | Admitting: Internal Medicine

## 2014-05-31 ENCOUNTER — Telehealth: Payer: Self-pay | Admitting: Internal Medicine

## 2014-05-31 ENCOUNTER — Ambulatory Visit (HOSPITAL_BASED_OUTPATIENT_CLINIC_OR_DEPARTMENT_OTHER): Payer: Medicare HMO | Admitting: Internal Medicine

## 2014-05-31 ENCOUNTER — Other Ambulatory Visit (HOSPITAL_BASED_OUTPATIENT_CLINIC_OR_DEPARTMENT_OTHER): Payer: Medicare HMO

## 2014-05-31 ENCOUNTER — Encounter: Payer: Self-pay | Admitting: Internal Medicine

## 2014-05-31 VITALS — BP 148/62 | HR 93 | Temp 98.5°F | Resp 18 | Ht 62.0 in | Wt 177.5 lb

## 2014-05-31 DIAGNOSIS — C3412 Malignant neoplasm of upper lobe, left bronchus or lung: Secondary | ICD-10-CM

## 2014-05-31 DIAGNOSIS — Z87891 Personal history of nicotine dependence: Secondary | ICD-10-CM | POA: Diagnosis not present

## 2014-05-31 DIAGNOSIS — I10 Essential (primary) hypertension: Secondary | ICD-10-CM | POA: Diagnosis not present

## 2014-05-31 DIAGNOSIS — J449 Chronic obstructive pulmonary disease, unspecified: Secondary | ICD-10-CM | POA: Diagnosis not present

## 2014-05-31 DIAGNOSIS — Z808 Family history of malignant neoplasm of other organs or systems: Secondary | ICD-10-CM

## 2014-05-31 DIAGNOSIS — C3492 Malignant neoplasm of unspecified part of left bronchus or lung: Secondary | ICD-10-CM

## 2014-05-31 LAB — CBC WITH DIFFERENTIAL/PLATELET
BASO%: 0.5 % (ref 0.0–2.0)
Basophils Absolute: 0 10*3/uL (ref 0.0–0.1)
EOS ABS: 0.2 10*3/uL (ref 0.0–0.5)
EOS%: 2.5 % (ref 0.0–7.0)
HCT: 39.5 % (ref 34.8–46.6)
HGB: 12.8 g/dL (ref 11.6–15.9)
LYMPH%: 35.2 % (ref 14.0–49.7)
MCH: 27.6 pg (ref 25.1–34.0)
MCHC: 32.5 g/dL (ref 31.5–36.0)
MCV: 84.9 fL (ref 79.5–101.0)
MONO#: 0.9 10*3/uL (ref 0.1–0.9)
MONO%: 9.3 % (ref 0.0–14.0)
NEUT#: 5 10*3/uL (ref 1.5–6.5)
NEUT%: 52.5 % (ref 38.4–76.8)
Platelets: 371 10*3/uL (ref 145–400)
RBC: 4.65 10*6/uL (ref 3.70–5.45)
RDW: 15 % — AB (ref 11.2–14.5)
WBC: 9.5 10*3/uL (ref 3.9–10.3)
lymph#: 3.4 10*3/uL — ABNORMAL HIGH (ref 0.9–3.3)

## 2014-05-31 LAB — COMPREHENSIVE METABOLIC PANEL (CC13)
ALBUMIN: 3.4 g/dL — AB (ref 3.5–5.0)
ALK PHOS: 111 U/L (ref 40–150)
ALT: 27 U/L (ref 0–55)
AST: 21 U/L (ref 5–34)
Anion Gap: 13 mEq/L — ABNORMAL HIGH (ref 3–11)
BUN: 13.2 mg/dL (ref 7.0–26.0)
CO2: 23 mEq/L (ref 22–29)
Calcium: 9.6 mg/dL (ref 8.4–10.4)
Chloride: 108 mEq/L (ref 98–109)
Creatinine: 0.8 mg/dL (ref 0.6–1.1)
EGFR: 80 mL/min/{1.73_m2} — AB (ref >90)
GLUCOSE: 101 mg/dL (ref 70–140)
POTASSIUM: 5.1 meq/L (ref 3.5–5.1)
Sodium: 143 mEq/L (ref 136–145)
TOTAL PROTEIN: 7.5 g/dL (ref 6.4–8.3)
Total Bilirubin: <0.2 mg/dL (ref 0.20–1.20)

## 2014-05-31 NOTE — Telephone Encounter (Signed)
Gave avs & calendar for May. Sent message to schedule treatment. °

## 2014-05-31 NOTE — Progress Notes (Signed)
[place] Telephone:([phone]) [phone]   Fax:([phone]) [phone]  CONSULT NOTE  REFERRING PHYSICIAN: Dr. Modesto Charon  REASON FOR CONSULTATION:  81 years old African-American female recently diagnosed with lung cancer.   HPI [name] [name] is a 81 y.o. female with past medical history significant for COPD, peripheral vascular disease, borderline diabetes mellitus, hypertension as well as long history of heavy smoking. The patient has been seen several times recently at the urgent care Center complaining of acute bronchitis and treated for COPD. Because of the recurrent episodes of acute bronchitis, chest x-ray was performed and showed a lesion in the left lung field. This was followed by CT scan of the chest which showed a 4.5 x 3.9 cm mass in the left upper lobe. The patient was seen by Dr. Melvyn Novas and referred to [name] for evaluation and consideration of surgical resection. A PET scan was performed on 03/29/2014 and it showed hypermetabolic left upper lobe mass abutting the pleural surface. There was no clear hypermetabolic mediastinal lymph nodes and no evidence for extrathoracic disease. There was also subpleural nodule in the left upper lobe measuring 0.7 cm. MRI of the brain on 04/17/2014 showed no evidence for metastatic disease to the brain. On 05/04/2014 the patient underwent left VATS, thoracoscopic left upper lobectomy with mediastinal lymph node dissection. The final pathology (Accession: [id number]) showed invasive moderately to poorly differentiated squamous cell carcinoma, spanning 6.0 cm in greatest dimension. There was focal superficial visceral pleural invasion identified. The resection margin and dissected lymph nodes were negative for malignancy. [name] kindly referred the patient to me today for evaluation and discussion of her treatment options. The patient is recovering well from her surgery and has no complaints except for insomnia the last few  nights as well as left-sided chest soreness at the surgical scar area and shortness of breath with exertion. She also has mild cough with no hemoptysis. The patient denied having any significant weight loss or night sweats. She has no headache or visual changes. Family history significant for mother with uterine cancer, father had heart disease and uncle with prostate cancer. The patient is divorced and has 5 children. She was accompanied today by her daughter [name] [name] and her son-in-law Jeneen Rinks. She has a history of smoking 1 pack per day for around 70 years but quit 05/03/2014. She has no history of alcohol or drug abuse.  HPI  Past Medical History  Diagnosis Date  . Palpitations   . SOB (shortness of breath)   . COPD (chronic obstructive pulmonary disease)   . HTN (hypertension)   . Diabetes mellitus without complication   . Anxiety   . Arthritis   . Cancer     Past Surgical History  Procedure Laterality Date  . Vaginal hysterectomy  1966  . Lung lobectomy  05/04/14    left upper lobectomy  . Video assisted thoracoscopy (vats)/ lobectomy Left 05/04/2014    Procedure: LEFT VIDEO ASSISTED THORACOSCOPY (VATS)/LEFT UPPER LOBECTOMY;  Surgeon: [name], MD;  Location: [facility];  Service: Thoracic;  Laterality: Left;  . Lead removal Left 05/04/2014    Procedure: CRYO INTERCOSTAL NERVE BLOCK;  Surgeon: [name], MD;  Location: [facility];  Service: Thoracic;  Laterality: Left;  . Node dissection Left 05/04/2014    Procedure: NODE DISSECTION;  Surgeon: [name], MD;  Location: [facility];  Service: Thoracic;  Laterality: Left;    Family History  Problem Relation Age of Onset  . Cervical cancer Mother   .  Prostate cancer Father   . Stroke Maternal Grandmother   . Angina Maternal Grandfather   . Prostate cancer Paternal Uncle     Social History History  Substance Use Topics  . Smoking status: Current Every Day Smoker -- 0.25 packs/day for 69 years    Types:  Cigarettes  . Smokeless tobacco: Not on file     Comment: Pt is trying to quit  . Alcohol Use: No    Allergies  Allergen Reactions  . Darvon [Propoxyphene Hcl]   . Lipitor [Atorvastatin]     myalgia's    Current Outpatient Prescriptions  Medication Sig Dispense Refill  . ALPRAZolam (XANAX) 0.5 MG tablet Take 0.5 mg by mouth 3 (three) times daily as needed for anxiety.    Marland Kitchen aspirin 81 MG tablet Take 81 mg by mouth daily.    Marland Kitchen atorvastatin (LIPITOR) 10 MG tablet Take 10 mg by mouth daily.    . budesonide-formoterol (SYMBICORT) 80-4.5 MCG/ACT inhaler Inhale 2 puffs into the lungs 2 (two) times daily as needed (Shortness of Breath).    Marland Kitchen buPROPion (WELLBUTRIN SR) 150 MG 12 hr tablet Take 150 mg by mouth every 12 (twelve) hours.    . carboxymethylcellulose (REFRESH PLUS) 0.5 % SOLN Place 1 drop into both eyes daily as needed.    . Fluticasone-Salmeterol (ADVAIR) 250-50 MCG/DOSE AEPB Inhale 1 puff into the lungs 2 (two) times daily as needed (Shortness of Breath).    . loteprednol (LOTEMAX) 0.2 % SUSP Apply 1 drop to eye 2 (two) times daily. For 14 days    . metFORMIN (GLUCOPHAGE) 500 MG tablet Take 500 mg by mouth 2 (two) times daily with a meal.    . metoprolol tartrate (LOPRESSOR) 25 MG tablet Take 25 mg by mouth 2 (two) times daily.    . naproxen sodium (ANAPROX) 220 MG tablet Take 220 mg by mouth 2 (two) times daily as needed (pain).    . Propylene Glycol (SYSTANE BALANCE OP) Apply 1 drop to eye daily as needed (dry eyes).    . valsartan-hydrochlorothiazide (DIOVAN-HCT) 160-25 MG per tablet Take 1 tablet by mouth every morning.     Marland Kitchen oxyCODONE (OXY IR/ROXICODONE) 5 MG immediate release tablet Take 1 tablet (5 mg total) by mouth every 4 (four) hours as needed for severe pain. (Patient not taking: Reported on 05/31/2014) 30 tablet 0   No current facility-administered medications for this visit.    Review of Systems  Constitutional: negative Eyes: negative Ears, nose, mouth, throat,  and face: negative Respiratory: positive for cough, dyspnea on exertion and pleurisy/chest pain Cardiovascular: negative Gastrointestinal: negative Genitourinary:negative Integument/breast: negative Hematologic/lymphatic: negative Musculoskeletal:negative Neurological: negative Behavioral/Psych: positive for sleep disturbance Endocrine: negative Allergic/Immunologic: negative  Physical Exam  BPZ:WCHEN, healthy, no distress, well nourished and well developed SKIN: skin color, texture, turgor are normal, no rashes or significant lesions HEAD: Normocephalic, No masses, lesions, tenderness or abnormalities EYES: normal, PERRLA EARS: External ears normal, Canals clear OROPHARYNX:no exudate, no erythema and lips, buccal mucosa, and tongue normal  NECK: supple, no adenopathy, no JVD LYMPH:  no palpable lymphadenopathy, no hepatosplenomegaly BREAST:not examined LUNGS: clear to auscultation , and palpation HEART: regular rate & rhythm, no murmurs and no gallops ABDOMEN:abdomen soft, non-tender, normal bowel sounds and no masses or organomegaly BACK: Back symmetric, no curvature., No CVA tenderness EXTREMITIES:no joint deformities, effusion, or inflammation, no edema, no skin discoloration  NEURO: alert & oriented x 3 with fluent speech, no focal motor/sensory deficits  PERFORMANCE STATUS: ECOG 1  LABORATORY DATA:  Lab Results  Component Value Date   WBC 9.5 05/31/2014   HGB 12.8 05/31/2014   HCT 39.5 05/31/2014   MCV 84.9 05/31/2014   PLT 371 05/31/2014      Chemistry      Component Value Date/Time   NA 143 05/31/2014 1326   NA 139 05/09/2014 0453   K 5.1 05/31/2014 1326   K 4.3 05/10/2014 0812   CL 105 05/09/2014 0453   CO2 23 05/31/2014 1326   CO2 31 05/09/2014 0453   BUN 13.2 05/31/2014 1326   BUN 13 05/09/2014 0453   CREATININE 0.8 05/31/2014 1326   CREATININE 0.98 05/09/2014 0453   CREATININE 1.00 04/11/2014 1120      Component Value Date/Time   CALCIUM 9.6  05/31/2014 1326   CALCIUM 8.7 05/09/2014 0453   ALKPHOS 111 05/31/2014 1326   ALKPHOS 69 05/06/2014 0420   AST 21 05/31/2014 1326   AST 17 05/06/2014 0420   ALT 27 05/31/2014 1326   ALT 16 05/06/2014 0420   BILITOT <0.20 05/31/2014 1326   BILITOT 0.4 05/06/2014 0420       RADIOGRAPHIC STUDIES: Dg Chest 2 View  05/23/2014   CLINICAL DATA:  Squamous cell carcinoma of the lung.  EXAM: CHEST  2 VIEW  COMPARISON:  05/10/2014 and 05/08/2014  FINDINGS: There is a small loculated left hydro never thorax superiorly and anteriorly. The pleural effusion has diminished. Remaining portion of the left lung is normal. Right lung is clear. Heart size and vascularity are normal. Calcification in the thoracic aorta.  IMPRESSION: Persistent small left hydro pneumothorax.  Diminished left effusion.   Electronically Signed   By: Lorriane Shire M.D.   On: 05/23/2014 09:35   Dg Chest 2 View  05/10/2014   CLINICAL DATA:  Short of breath.  Chest soreness.  EXAM: CHEST  2 VIEW  COMPARISON:  05/09/2014  FINDINGS: Left chest tube is been removed. Stable tiny left apical pneumothorax. Heterogeneous opacities in the left mid and lower lung zones unchanged. Subcutaneous emphysema in the left anterior thorax soft tissues stable. Right lung clear.  IMPRESSION: Left chest tube removed with a tiny left apical pneumothorax. Stable left lung airspace disease.   Electronically Signed   By: Marybelle Killings M.D.   On: 05/10/2014 08:02   Dg Chest 2 View  05/09/2014   CLINICAL DATA:  Chest pain.  Status post left lobectomy  EXAM: CHEST  2 VIEW  COMPARISON:  May 08, 2014  FINDINGS: The more inferior chest tube has been removed compared to 1 day prior. It is chest tube remains in place. There is a trace pneumothorax in the left apical region, smaller compared to 1 day prior. There is subcutaneous air on the left, grossly stable.  There is a small area of focal consolidation in the left base. Lungs elsewhere clear. Heart size and pulmonary  vascularity are normal. There is atherosclerotic change in the aorta.  IMPRESSION: One of the chest tubes has been removed. Trace pneumothorax left apex. Subcutaneous air on left remains. Focal consolidation left base region remains. Elsewhere lungs clear. No change in cardiac silhouette.   Electronically Signed   By: Lowella Grip III M.D.   On: 05/09/2014 07:46   Dg Chest 2 View  05/02/2014   CLINICAL DATA:  Lung mass.  Cough.  Smoker.  EXAM: CHEST  2 VIEW  COMPARISON:  CT 03/29/2014.  FINDINGS: Mediastinum hilar structures are unremarkable. Left upper lung mass is unchanged. Heart size normal. No pleural effusion  or pneumothorax. No acute bony abnormality .  IMPRESSION: Persistent left upper lung mass again noted.  No significant change.   Electronically Signed   By: Marcello Moores  Register   On: 05/02/2014 13:11   Dg Chest 1v Repeat Same Day  05/07/2014   CLINICAL DATA:  Status post left lobectomy, follow-up pneumothorax  EXAM: CHEST - 1 VIEW SAME DAY  COMPARISON:  05/07/2014  FINDINGS: Cardiomediastinal silhouette is stable. Right IJ central line is unchanged in position. Again noted postsurgical changes with small atelectasis in left lower lobe. Small loculated pneumothorax left base is stable. Left chest tubes are unchanged in position. There is progression of left upper pneumothorax measures about 10-12%.  IMPRESSION: Right IJ central line is unchanged in position. Again noted postsurgical changes with small atelectasis in left lower lobe. Small loculated pneumothorax left base is stable. Left chest tubes are unchanged in position. There is progression of left upper pneumothorax measures about 10-12%.   Electronically Signed   By: Lahoma Crocker M.D.   On: 05/07/2014 12:44   Dg Chest Port 1 View  05/08/2014   CLINICAL DATA:  Status post left-sided thoracotomy for parenchymal mass, reassess pneumothoraces  EXAM: PORTABLE CHEST - 1 VIEW  COMPARISON:  Portable chest x-ray of May 07, 2014  FINDINGS: The  right lung is well-expanded. On the left there is mild stable volume loss. There is a small apical pneumothorax amounting to between 5 and 10% of the lung volume. The basilar pneumothorax is less apparent today. Two left-sided chest tubes are unchanged in position. The right internal jugular venous catheter tip projects over the midportion of the SVC. The left heart border remains obscured. The pulmonary vascularity is not engorged. There is considerable left axillary subcutaneous emphysema.  IMPRESSION: 1. Decreased volume of the left-sided pleural effusion in the apex and in the subpulmonic region. The chest tubes are stable. 2. Persistent mild volume loss on the left consistent with previous partial lobectomy. The right lung is clear.   Electronically Signed   By: David  Martinique   On: 05/08/2014 07:34   Dg Chest Port 1 View  05/07/2014   CLINICAL DATA:  Post lobectomy exam  EXAM: PORTABLE CHEST - 1 VIEW  COMPARISON:  05/06/2014  FINDINGS: Left-sided chest tubes are in place with decrease in size of now small left pneumothorax. No mediastinal shift. Right IJ central line tip terminates over the mid SVC. Right lung is clear.  IMPRESSION: Decrease in size of small left pneumothorax.   Electronically Signed   By: Conchita Paris M.D.   On: 05/07/2014 09:19   Dg Chest Port 1 View  05/06/2014   CLINICAL DATA:  Status post left-sided lung lobectomy. Subsequent encounter.  EXAM: PORTABLE CHEST - 1 VIEW  COMPARISON:  Chest radiograph performed 05/05/2014  FINDINGS: A small new left-sided pneumothorax is seen, measuring approximately 20% of left lung volume.  Two left-sided chest tubes are noted in only slightly changed position, with the left apical chest tube more medial and inferior than on the prior study. A right IJ line is noted ending overlying the mid SVC.  There is mild persistent elevation of the left hemidiaphragm. Mild left basilar atelectasis is noted. Pulmonary vascularity is at the upper limits of  normal. No pleural effusion is seen.  The cardiomediastinal silhouette is borderline normal in size. No acute osseous abnormalities are identified. A small amount of soft tissue air is noted on the left.  IMPRESSION: 1. New small left-sided pneumothorax, measuring approximately 20% of  left lung volume. 2. Two left-sided chest tubes noted in only slightly changed position, with the left apical chest tube more medial and inferior than on the prior study. 3. Mild persistent elevation of the left hemidiaphragm, with mild left basilar atelectasis.  Critical Value/emergent results were called by telephone at the time of interpretation on 05/06/2014 at 7:25 am to [facility], who verbally acknowledged these results.   Electronically Signed   By: Garald Balding M.D.   On: 05/06/2014 07:27   Dg Chest Port 1 View  05/05/2014   CLINICAL DATA:  Status post lobectomy of the lung  EXAM: PORTABLE CHEST - 1 VIEW  COMPARISON:  05/04/2014  FINDINGS: Right IJ central line, tip at the SVC level. Unchanged postop distortion of the mediastinum. No evidence of new cardiomegaly.  Improved right lung aeration. Stable left chest hazy density, likely pleural fluid and thickening. Two left-sided chest tubes are in stable position. No definitive pneumothorax.  IMPRESSION: 1. No visible pneumothorax. 2. Improving right lung aeration.   Electronically Signed   By: Monte Fantasia M.D.   On: 05/05/2014 08:30   Dg Chest Port 1 View  05/04/2014   CLINICAL DATA:  Status post left upper lobectomy  EXAM: PORTABLE CHEST - 1 VIEW  COMPARISON:  05/02/2014  FINDINGS: Postsurgical changes with volume loss in the left hemithorax. Left apical and medial chest tubes. Possible tiny pneumothorax.  Patchy opacities in the right mid/ lower lung.  The heart is normal in size.  Leftward cardiomediastinal shift.  Right IJ venous catheter overlies the cavoatrial junction.  IMPRESSION: Postsurgical changes with volume loss in the left hemithorax.  Left  apical and medial chest tubes.  Possible tiny pneumothorax.   Electronically Signed   By: Julian Hy M.D.   On: 05/04/2014 14:38    ASSESSMENT: This is a very pleasant 81 years old African-American female recently diagnosed with a stage IIA (T2b, N0, M0) non-small cell lung cancer, squamous cell carcinoma diagnosed in February 2016 status post left upper lobectomy with lymph node dissection.   PLAN: I had a lengthy discussion with the patient and her family today about her current disease stage, prognosis and treatment options. I explained to the patient that the current standard of care is to consider adjuvant chemotherapy for patient with a stage IIB non-small cell lung cancer which showed increase in the 5 year survival by around 10%. The patient was also given the option of observation and close monitoring. She is interested in proceeding with systemic chemotherapy. I recommended for her a regimen consisting of carboplatin for AUC of 5 and paclitaxel 175 MG/M2 every 3 weeks with Neulasta support for a total of 4 cycles. I discussed with the patient and her family the adverse effect of the chemotherapy including but not limited to alopecia, myelosuppression, nausea and vomiting, peripheral neuropathy, liver or renal dysfunction. She is expected to start the first cycle of this treatment in 3 weeks for more time to recover from the recent surgery. The patient would come back for follow-up visit at that time. I will arrange for the patient to have a chemotherapy education class before starting the first dose of her treatment. She was advised to call immediately if she has any concerning symptoms in the interval.  The patient voices understanding of current disease status and treatment options and is in agreement with the current care plan.  All questions were answered. The patient knows to call the clinic with any problems, questions or  concerns. We can certainly see the patient much  sooner if necessary.  Thank you so much for allowing me to participate in the care of [facility]. I will continue to follow up the patient with you and assist in her care.  I spent 55 minutes counseling the patient face to face. The total time spent in the appointment was 80 minutes.  Disclaimer: This note was dictated with voice recognition software. Similar sounding words can inadvertently be transcribed and may not be corrected upon review.   Ulus Hazen K. May 31, 2014, 3:07 PM

## 2014-06-02 ENCOUNTER — Telehealth (HOSPITAL_COMMUNITY): Payer: Self-pay

## 2014-06-02 NOTE — Telephone Encounter (Signed)
Called patient regarding entrance to Pulmonary Rehab.  Patient states that they are interested in attending the program but has transportation issues on Tuesdays and Thursdays. She requested that I check her insurance for her and she will attempt to find transportation to and from rehab. Will follow up with patient once insurance coverage is verified,.

## 2014-06-13 ENCOUNTER — Telehealth: Payer: Self-pay | Admitting: *Deleted

## 2014-06-13 ENCOUNTER — Other Ambulatory Visit: Payer: Self-pay

## 2014-06-13 NOTE — Telephone Encounter (Signed)
No additional note

## 2014-06-14 ENCOUNTER — Other Ambulatory Visit: Payer: Medicare HMO

## 2014-06-14 ENCOUNTER — Encounter: Payer: Self-pay | Admitting: *Deleted

## 2014-06-15 ENCOUNTER — Telehealth (HOSPITAL_COMMUNITY): Payer: Self-pay

## 2014-06-15 NOTE — Telephone Encounter (Signed)
Called patient to discuss Pulmonary Rehab.  Patient is interested in the program and is able and willing to pay copay.  Patient's main source of transportation is her son in law.  Patient states that son in law has dialysis on Tuesdays and Thursdays and would not be able to bring her.  I suggested for the patient to look into [facility].  Patient states that if she can coordinate transportation in the future she will follow up with [name] but at this time, she will not be able to attend rehab.

## 2014-06-20 ENCOUNTER — Telehealth: Payer: Self-pay | Admitting: *Deleted

## 2014-06-20 NOTE — Telephone Encounter (Signed)
No additional note

## 2014-06-21 ENCOUNTER — Ambulatory Visit (HOSPITAL_BASED_OUTPATIENT_CLINIC_OR_DEPARTMENT_OTHER): Payer: Medicare HMO

## 2014-06-21 ENCOUNTER — Encounter: Payer: Self-pay | Admitting: Physician Assistant

## 2014-06-21 ENCOUNTER — Ambulatory Visit (HOSPITAL_BASED_OUTPATIENT_CLINIC_OR_DEPARTMENT_OTHER): Payer: Medicare HMO | Admitting: Physician Assistant

## 2014-06-21 ENCOUNTER — Telehealth: Payer: Self-pay | Admitting: Physician Assistant

## 2014-06-21 ENCOUNTER — Other Ambulatory Visit (HOSPITAL_BASED_OUTPATIENT_CLINIC_OR_DEPARTMENT_OTHER): Payer: Medicare HMO

## 2014-06-21 VITALS — BP 153/88 | HR 97 | Temp 98.2°F | Resp 18 | Ht 62.0 in | Wt 182.2 lb

## 2014-06-21 VITALS — BP 153/75 | HR 78 | Temp 97.7°F | Resp 18

## 2014-06-21 DIAGNOSIS — C3492 Malignant neoplasm of unspecified part of left bronchus or lung: Secondary | ICD-10-CM

## 2014-06-21 DIAGNOSIS — Z5111 Encounter for antineoplastic chemotherapy: Secondary | ICD-10-CM | POA: Diagnosis not present

## 2014-06-21 DIAGNOSIS — C3412 Malignant neoplasm of upper lobe, left bronchus or lung: Secondary | ICD-10-CM

## 2014-06-21 LAB — COMPREHENSIVE METABOLIC PANEL (CC13)
ALBUMIN: 3.2 g/dL — AB (ref 3.5–5.0)
ALT: 12 U/L (ref 0–55)
AST: 13 U/L (ref 5–34)
Alkaline Phosphatase: 114 U/L (ref 40–150)
Anion Gap: 13 mEq/L — ABNORMAL HIGH (ref 3–11)
BILIRUBIN TOTAL: 0.22 mg/dL (ref 0.20–1.20)
BUN: 15 mg/dL (ref 7.0–26.0)
CO2: 19 meq/L — AB (ref 22–29)
CREATININE: 0.9 mg/dL (ref 0.6–1.1)
Calcium: 9.2 mg/dL (ref 8.4–10.4)
Chloride: 111 mEq/L — ABNORMAL HIGH (ref 98–109)
EGFR: 74 mL/min/{1.73_m2} — ABNORMAL LOW (ref >90)
Glucose: 191 mg/dl — ABNORMAL HIGH (ref 70–140)
Potassium: 3.9 mEq/L (ref 3.5–5.1)
Sodium: 142 mEq/L (ref 136–145)
Total Protein: 6.8 g/dL (ref 6.4–8.3)

## 2014-06-21 LAB — CBC WITH DIFFERENTIAL/PLATELET
BASO%: 0.5 % (ref 0.0–2.0)
Basophils Absolute: 0.1 10*3/uL (ref 0.0–0.1)
EOS%: 1 % (ref 0.0–7.0)
Eosinophils Absolute: 0.1 10*3/uL (ref 0.0–0.5)
HCT: 37.6 % (ref 34.8–46.6)
HGB: 12.4 g/dL (ref 11.6–15.9)
LYMPH#: 3.2 10*3/uL (ref 0.9–3.3)
LYMPH%: 26.5 % (ref 14.0–49.7)
MCH: 27.9 pg (ref 25.1–34.0)
MCHC: 33 g/dL (ref 31.5–36.0)
MCV: 84.5 fL (ref 79.5–101.0)
MONO#: 0.8 10*3/uL (ref 0.1–0.9)
MONO%: 6.5 % (ref 0.0–14.0)
NEUT#: 7.8 10*3/uL — ABNORMAL HIGH (ref 1.5–6.5)
NEUT%: 65.5 % (ref 38.4–76.8)
Platelets: 353 10*3/uL (ref 145–400)
RBC: 4.45 10*6/uL (ref 3.70–5.45)
RDW: 14.8 % — ABNORMAL HIGH (ref 11.2–14.5)
WBC: 12 10*3/uL — ABNORMAL HIGH (ref 3.9–10.3)

## 2014-06-21 MED ORDER — SODIUM CHLORIDE 0.9 % IV SOLN
Freq: Once | INTRAVENOUS | Status: AC
  Administered 2014-06-21: 11:00:00 via INTRAVENOUS
  Filled 2014-06-21: qty 8

## 2014-06-21 MED ORDER — DIPHENHYDRAMINE HCL 50 MG/ML IJ SOLN
INTRAMUSCULAR | Status: AC
  Filled 2014-06-21: qty 1

## 2014-06-21 MED ORDER — PACLITAXEL CHEMO INJECTION 300 MG/50ML
175.0000 mg/m2 | Freq: Once | INTRAVENOUS | Status: AC
  Administered 2014-06-21: 330 mg via INTRAVENOUS
  Filled 2014-06-21: qty 55

## 2014-06-21 MED ORDER — PROCHLORPERAZINE MALEATE 10 MG PO TABS: 10.0000 mg | ORAL_TABLET | Freq: Four times a day (QID) | ORAL | Status: DC | PRN

## 2014-06-21 MED ORDER — SODIUM CHLORIDE 0.9 % IV SOLN
419.5000 mg | Freq: Once | INTRAVENOUS | Status: AC
  Administered 2014-06-21: 420 mg via INTRAVENOUS
  Filled 2014-06-21: qty 42

## 2014-06-21 MED ORDER — DIPHENHYDRAMINE HCL 50 MG/ML IJ SOLN
50.0000 mg | Freq: Once | INTRAMUSCULAR | Status: AC
  Administered 2014-06-21: 50 mg via INTRAVENOUS

## 2014-06-21 MED ORDER — SODIUM CHLORIDE 0.9 % IV SOLN
Freq: Once | INTRAVENOUS | Status: AC
  Administered 2014-06-21: 11:00:00 via INTRAVENOUS

## 2014-06-21 MED ORDER — ACETAMINOPHEN 325 MG PO TABS
ORAL_TABLET | ORAL | Status: AC
  Filled 2014-06-21: qty 2

## 2014-06-21 MED ORDER — FAMOTIDINE IN NACL 20-0.9 MG/50ML-% IV SOLN
20.0000 mg | Freq: Once | INTRAVENOUS | Status: AC
  Administered 2014-06-21: 20 mg via INTRAVENOUS

## 2014-06-21 MED ORDER — ACETAMINOPHEN 325 MG PO TABS
650.0000 mg | ORAL_TABLET | Freq: Once | ORAL | Status: AC
  Administered 2014-06-21: 650 mg via ORAL

## 2014-06-21 MED ORDER — FAMOTIDINE IN NACL 20-0.9 MG/50ML-% IV SOLN
INTRAVENOUS | Status: AC
  Filled 2014-06-21: qty 50

## 2014-06-21 NOTE — Telephone Encounter (Signed)
Pt confirmed labs/ov per 05/11 POF, gave pt AVS and Calendar.... KJ °

## 2014-06-21 NOTE — Patient Instructions (Signed)
[facility] Discharge Instructions for Patients Receiving Chemotherapy  Today you received the following chemotherapy agents; Taxol and Carboplatin.   To help prevent nausea and vomiting after your treatment, we encourage you to take your nausea medication, Compazine (prochloraperzine) every 6 hrs as needed.    If you develop nausea and vomiting that is not controlled by your nausea medication, call the clinic.   BELOW ARE SYMPTOMS THAT SHOULD BE REPORTED IMMEDIATELY:  *FEVER GREATER THAN 100.5 F  *CHILLS WITH OR WITHOUT FEVER  NAUSEA AND VOMITING THAT IS NOT CONTROLLED WITH YOUR NAUSEA MEDICATION  *UNUSUAL SHORTNESS OF BREATH  *UNUSUAL BRUISING OR BLEEDING  TENDERNESS IN MOUTH AND THROAT WITH OR WITHOUT PRESENCE OF ULCERS  *URINARY PROBLEMS  *BOWEL PROBLEMS  UNUSUAL RASH Items with * indicate a potential emergency and should be followed up as soon as possible.  Feel free to call the clinic you have any questions or concerns. The clinic phone number is [phone].  Please show the [facility] at check-in to the Emergency Department and triage nurse.

## 2014-06-21 NOTE — Progress Notes (Addendum)
No images are attached to the encounter. No scans are attached to the encounter. No scans are attached to the encounter. [facility] SHARED VISIT PROGRESS NOTE  No PCP Per Patient No address on file  DIAGNOSIS: Lung cancer   Staging form: Lung, AJCC 7th Edition     Clinical: Stage IIA (T2b, N0, M0) - Signed by [name], MD on 05/31/2014  PRIOR THERAPY: Status post left upper lobectomy with lymph node dissection under the care of [name] on 05/04/2014  CURRENT THERAPY: Adjuvant chemotherapy with carboplatin for an AUC of 5 and paclitaxel 175 mg/m given every 3 weeks with Neulasta support. A total of 4 cycles of adjuvant chemotherapy are planned.  INTERVAL HISTORY: [name] [name] 81 y.o. female returns for scheduled regular office visit for followup of her recently diagnosed stage II a (T2b, N0, M0) non-small cell lung cancer squamous cell carcinoma. She presents today for her first cycle of adjuvant chemotherapy with carboplatin and paclitaxel with Neulasta support. She reports that she quit smoking 06/03/2014. She presents to start cycle #1 of 4 planned cycles of adjuvant chemotherapy with carboplatin for an easier 5 and paclitaxel 175 mg/m given her 3 weeks with Neulasta support. She voices no specific complaints today and is ready to proceed with chemotherapy adjuvant chemotherapy. She reports that [name] advised for her to participate in what sounds like pulmonary rehabilitation classes however she has transportation issues and will not be able to comply at this time.  MEDICAL HISTORY: Past Medical History  Diagnosis Date  . Palpitations   . SOB (shortness of breath)   . COPD (chronic obstructive pulmonary disease)   . HTN (hypertension)   . Diabetes mellitus without complication   . Anxiety   . Arthritis   . Cancer     ALLERGIES:  is allergic to darvon and lipitor.  MEDICATIONS:  Current Outpatient Prescriptions  Medication Sig Dispense  Refill  . ALPRAZolam (XANAX) 0.5 MG tablet Take 0.5 mg by mouth 3 (three) times daily as needed for anxiety.    Marland Kitchen aspirin 81 MG tablet Take 81 mg by mouth daily.    Marland Kitchen atorvastatin (LIPITOR) 10 MG tablet Take 10 mg by mouth daily.    . budesonide-formoterol (SYMBICORT) 80-4.5 MCG/ACT inhaler Inhale 2 puffs into the lungs 2 (two) times daily as needed (Shortness of Breath).    Marland Kitchen buPROPion (WELLBUTRIN SR) 150 MG 12 hr tablet Take 150 mg by mouth every 12 (twelve) hours.    . carboxymethylcellulose (REFRESH PLUS) 0.5 % SOLN Place 1 drop into both eyes daily as needed.    . Fluticasone-Salmeterol (ADVAIR) 250-50 MCG/DOSE AEPB Inhale 1 puff into the lungs 2 (two) times daily as needed (Shortness of Breath).    . loteprednol (LOTEMAX) 0.2 % SUSP Apply 1 drop to eye 2 (two) times daily. For 14 days    . metFORMIN (GLUCOPHAGE) 500 MG tablet Take 500 mg by mouth 2 (two) times daily with a meal.    . metoprolol tartrate (LOPRESSOR) 25 MG tablet Take 25 mg by mouth 2 (two) times daily.    . naproxen sodium (ANAPROX) 220 MG tablet Take 220 mg by mouth 2 (two) times daily as needed (pain).    Marland Kitchen oxyCODONE (OXY IR/ROXICODONE) 5 MG immediate release tablet Take 1 tablet (5 mg total) by mouth every 4 (four) hours as needed for severe pain. 30 tablet 0  . Propylene Glycol (SYSTANE BALANCE OP) Apply 1 drop to eye daily as needed (dry eyes).    Marland Kitchen  valsartan-hydrochlorothiazide (DIOVAN-HCT) 160-25 MG per tablet Take 1 tablet by mouth every morning.     . prochlorperazine (COMPAZINE) 10 MG tablet Take 1 tablet (10 mg total) by mouth every 6 (six) hours as needed for nausea or vomiting. 30 tablet 1   No current facility-administered medications for this visit.   Facility-Administered Medications Ordered in Other Visits  Medication Dose Route Frequency Provider Last Rate Last Dose  . CARBOplatin (PARAPLATIN) 420 mg in sodium chloride 0.9 % 250 mL chemo infusion  420 mg Intravenous Once [name], MD      .  PACLitaxel (TAXOL) 330 mg in dextrose 5 % 500 mL chemo infusion (> '80mg'$ /m2)  175 mg/m2 (Treatment Plan Actual) Intravenous Once [name], MD 46 mL/hr at 06/21/14 1140 330 mg at 06/21/14 1140    SURGICAL HISTORY:  Past Surgical History  Procedure Laterality Date  . Vaginal hysterectomy  1966  . Lung lobectomy  05/04/14    left upper lobectomy  . Video assisted thoracoscopy (vats)/ lobectomy Left 05/04/2014    Procedure: LEFT VIDEO ASSISTED THORACOSCOPY (VATS)/LEFT UPPER LOBECTOMY;  Surgeon: [name], MD;  Location: [facility];  Service: Thoracic;  Laterality: Left;  . Lead removal Left 05/04/2014    Procedure: CRYO INTERCOSTAL NERVE BLOCK;  Surgeon: [name], MD;  Location: [facility];  Service: Thoracic;  Laterality: Left;  . Node dissection Left 05/04/2014    Procedure: NODE DISSECTION;  Surgeon: [name], MD;  Location: [facility];  Service: Thoracic;  Laterality: Left;    REVIEW OF SYSTEMS:  Review of Systems  Constitutional: Negative for fever, chills, weight loss, malaise/fatigue and diaphoresis.  HENT: Negative for congestion, ear discharge, ear pain, hearing loss, nosebleeds, sore throat and tinnitus.   Eyes: Negative for blurred vision, double vision, photophobia, pain, discharge and redness.  Respiratory: Negative for cough, hemoptysis, sputum production, shortness of breath, wheezing and stridor.   Cardiovascular: Negative for chest pain, palpitations, orthopnea, claudication, leg swelling and PND.  Gastrointestinal: Negative for heartburn, nausea, vomiting, abdominal pain, diarrhea, constipation, blood in stool and melena.  Genitourinary: Negative.   Musculoskeletal: Negative.   Skin: Negative.   Neurological: Negative for dizziness, tingling, focal weakness, seizures, weakness and headaches.  Endo/Heme/Allergies: Does not bruise/bleed easily.  Psychiatric/Behavioral: Negative for depression. The patient is nervous/anxious. The patient does not have  insomnia.        Anxious about starting chemotherapy     PHYSICAL EXAMINATION: Physical Exam  Constitutional: She is oriented to person, place, and time and well-developed, well-nourished, and in no distress.  HENT:  Head: Normocephalic and atraumatic.  Mouth/Throat: Oropharynx is clear and moist.  Eyes: Pupils are equal, round, and reactive to light.  Neck: Normal range of motion. Neck supple. No JVD present. No tracheal deviation present. No thyromegaly present.  Cardiovascular: Normal rate, regular rhythm, normal heart sounds and intact distal pulses.  Exam reveals no gallop and no friction rub.   No murmur heard. Pulmonary/Chest: Effort normal and breath sounds normal. No respiratory distress. She has no wheezes. She has no rales.  Abdominal: Soft. Bowel sounds are normal. She exhibits no distension and no mass. There is no tenderness.  Musculoskeletal: Normal range of motion. She exhibits no edema or tenderness.  Lymphadenopathy:    She has no cervical adenopathy.  Neurological: She is alert and oriented to person, place, and time. She has normal reflexes. Gait normal.  Skin: Skin is warm and dry. No rash noted.  Well healed surgical incision on left  side of chest     ECOG PERFORMANCE STATUS: 0 - Asymptomatic  Blood pressure 153/88, pulse 97, temperature 98.2 F (36.8 C), temperature source Oral, resp. rate 18, height '5\' 2"'$  (1.575 m), weight 182 lb 3.2 oz (82.645 kg), SpO2 97 %.  LABORATORY DATA: Lab Results  Component Value Date   WBC 12.0* 06/21/2014   HGB 12.4 06/21/2014   HCT 37.6 06/21/2014   MCV 84.5 06/21/2014   PLT 353 06/21/2014      Chemistry      Component Value Date/Time   NA 142 06/21/2014 0842   NA 139 05/09/2014 0453   K 3.9 06/21/2014 0842   K 4.3 05/10/2014 0812   CL 105 05/09/2014 0453   CO2 19* 06/21/2014 0842   CO2 31 05/09/2014 0453   BUN 15.0 06/21/2014 0842   BUN 13 05/09/2014 0453   CREATININE 0.9 06/21/2014 0842   CREATININE 0.98  05/09/2014 0453   CREATININE 1.00 04/11/2014 1120      Component Value Date/Time   CALCIUM 9.2 06/21/2014 0842   CALCIUM 8.7 05/09/2014 0453   ALKPHOS 114 06/21/2014 0842   ALKPHOS 69 05/06/2014 0420   AST 13 06/21/2014 0842   AST 17 05/06/2014 0420   ALT 12 06/21/2014 0842   ALT 16 05/06/2014 0420   BILITOT 0.22 06/21/2014 0842   BILITOT 0.4 05/06/2014 0420       RADIOGRAPHIC STUDIES:  Dg Chest 2 View  05/23/2014   CLINICAL DATA:  Squamous cell carcinoma of the lung.  EXAM: CHEST  2 VIEW  COMPARISON:  05/10/2014 and 05/08/2014  FINDINGS: There is a small loculated left hydro never thorax superiorly and anteriorly. The pleural effusion has diminished. Remaining portion of the left lung is normal. Right lung is clear. Heart size and vascularity are normal. Calcification in the thoracic aorta.  IMPRESSION: Persistent small left hydro pneumothorax.  Diminished left effusion.   Electronically Signed   By: Lorriane Shire M.D.   On: 05/23/2014 09:35     ASSESSMENT/PLAN:  No problem-specific assessment & plan notes found for this encounter.  The patient is a pleasant 81 year old African-American female recently diagnosed with stage IIa (T2b, N0, M0) non-small cell lung cancer, squamous cell carcinoma. This was diagnosed in February 2016 and she is status post left upper lobectomy with lymph node dissection. Patient was discussed with and also seen by Dr. Julien Nordmann. She will proceed with cycle #1 of her adjuvant chemotherapy with carboplatin for an AUC of 5 and paclitaxel 175 mg/m with Neulasta support. She'll follow-up in one week for symptom management visit and continue with weekly labs as scheduled.  Awilda Metro E, PA-C 06/21/2014  All questions were answered. The patient knows to call the clinic with any problems, questions or concerns. We can certainly see the patient much sooner if necessary.  ADDENDUM: Hematology/Oncology Attending: I had a face to face encounter with the  patient. I recommended her care plan. Pleasant 81 years old African-American female with stage II a non-small cell lung cancer status post left upper lobectomy with lymph node dissection and she is here today to start the first cycle of adjuvant chemotherapy with reduced dose carboplatin and paclitaxel. The patient is feeling fine with no specific complaints today. I recommended for her to continue her treatment as scheduled. She will come back for follow-up visit in one week for reevaluation and management of any adverse effect of her treatment. She was advised to call immediately if she has any concerning symptoms in the  interval.  Disclaimer: This note was dictated with voice recognition software. Similar sounding words can inadvertently be transcribed and may be missed upon review. Eilleen Kempf., MD 06/25/2014

## 2014-06-22 ENCOUNTER — Telehealth: Payer: Self-pay | Admitting: *Deleted

## 2014-06-22 NOTE — Patient Instructions (Signed)
Follow up in 1 week Continue weekly labs as scheduled

## 2014-06-22 NOTE — Telephone Encounter (Signed)
-----   Message from Cathlean Cower, RN sent at 06/21/2014  4:23 PM EDT ----- Regarding: chemo f/u call 1st time Taxol/ Carbo on 06/21/14.  Tolerated well.  Dr. Julien Nordmann.

## 2014-06-22 NOTE — Telephone Encounter (Signed)
Chemo follow up call to pt, pt in car unable to hear requested to call back after she gets home. No other concerns at this time.

## 2014-06-23 ENCOUNTER — Ambulatory Visit (HOSPITAL_BASED_OUTPATIENT_CLINIC_OR_DEPARTMENT_OTHER): Payer: Medicare HMO

## 2014-06-23 VITALS — BP 182/85 | HR 65 | Temp 98.3°F

## 2014-06-23 DIAGNOSIS — C3412 Malignant neoplasm of upper lobe, left bronchus or lung: Secondary | ICD-10-CM | POA: Diagnosis not present

## 2014-06-23 DIAGNOSIS — Z5189 Encounter for other specified aftercare: Secondary | ICD-10-CM

## 2014-06-23 MED ORDER — PEGFILGRASTIM INJECTION 6 MG/0.6ML ~~LOC~~
6.0000 mg | PREFILLED_SYRINGE | Freq: Once | SUBCUTANEOUS | Status: AC
  Administered 2014-06-23: 6 mg via SUBCUTANEOUS
  Filled 2014-06-23: qty 0.6

## 2014-06-23 NOTE — Patient Instructions (Signed)
Pegfilgrastim injection What is this medicine? PEGFILGRASTIM (peg fil GRA stim) is a long-acting granulocyte colony-stimulating factor that stimulates the growth of neutrophils, a type of white blood cell important in the body's fight against infection. It is used to reduce the incidence of fever and infection in patients with certain types of cancer who are receiving chemotherapy that affects the bone marrow. This medicine may be used for other purposes; ask your health care provider or pharmacist if you have questions. COMMON BRAND NAME(S): Neulasta What should I tell my health care provider before I take this medicine? They need to know if you have any of these conditions: -latex allergy -ongoing radiation therapy -sickle cell disease -skin reactions to acrylic adhesives (On-Body Injector only) -an unusual or allergic reaction to pegfilgrastim, filgrastim, other medicines, foods, dyes, or preservatives -pregnant or trying to get pregnant -breast-feeding How should I use this medicine? This medicine is for injection under the skin. If you get this medicine at home, you will be taught how to prepare and give the pre-filled syringe or how to use the On-body Injector. Refer to the patient Instructions for Use for detailed instructions. Use exactly as directed. Take your medicine at regular intervals. Do not take your medicine more often than directed. It is important that you put your used needles and syringes in a special sharps container. Do not put them in a trash can. If you do not have a sharps container, call your pharmacist or healthcare provider to get one. Talk to your pediatrician regarding the use of this medicine in children. Special care may be needed. Overdosage: If you think you have taken too much of this medicine contact a poison control center or emergency room at once. NOTE: This medicine is only for you. Do not share this medicine with others. What if I miss a dose? It is  important not to miss your dose. Call your doctor or health care professional if you miss your dose. If you miss a dose due to an On-body Injector failure or leakage, a new dose should be administered as soon as possible using a single prefilled syringe for manual use. What may interact with this medicine? Interactions have not been studied. Give your health care provider a list of all the medicines, herbs, non-prescription drugs, or dietary supplements you use. Also tell them if you smoke, drink alcohol, or use illegal drugs. Some items may interact with your medicine. This list may not describe all possible interactions. Give your health care provider a list of all the medicines, herbs, non-prescription drugs, or dietary supplements you use. Also tell them if you smoke, drink alcohol, or use illegal drugs. Some items may interact with your medicine. What should I watch for while using this medicine? You may need blood work done while you are taking this medicine. If you are going to need a MRI, CT scan, or other procedure, tell your doctor that you are using this medicine (On-Body Injector only). What side effects may I notice from receiving this medicine? Side effects that you should report to your doctor or health care professional as soon as possible: -allergic reactions like skin rash, itching or hives, swelling of the face, lips, or tongue -dizziness -fever -pain, redness, or irritation at site where injected -pinpoint red spots on the skin -shortness of breath or breathing problems -stomach or side pain, or pain at the shoulder -swelling -tiredness -trouble passing urine Side effects that usually do not require medical attention (report to your doctor   or health care professional if they continue or are bothersome): -bone pain -muscle pain This list may not describe all possible side effects. Call your doctor for medical advice about side effects. You may report side effects to FDA at  1-800-FDA-1088. Where should I keep my medicine? Keep out of the reach of children. Store pre-filled syringes in a refrigerator between 2 and 8 degrees C (36 and 46 degrees F). Do not freeze. Keep in carton to protect from light. Throw away this medicine if it is left out of the refrigerator for more than 48 hours. Throw away any unused medicine after the expiration date. NOTE: This sheet is a summary. It may not cover all possible information. If you have questions about this medicine, talk to your doctor, pharmacist, or health care provider.  2015, [employer]. (2013-04-28 16:14:05)  

## 2014-06-26 ENCOUNTER — Telehealth: Payer: Self-pay | Admitting: *Deleted

## 2014-06-26 NOTE — Telephone Encounter (Signed)
TC from pt's daughter regarding Neulasta injection pt received on this past Friday, 06/23/14. She states she experienced  hurting all over afterwards. Reviewed with daughter which medications to take and reinforced use of Claritin and either Advil or tylenol. Daughter states she had not gone with her and therefore did not understand about the claritin so pt did not take it.  She voiced understanding now and will have it for pt next time. No further questions.

## 2014-06-28 ENCOUNTER — Other Ambulatory Visit (HOSPITAL_BASED_OUTPATIENT_CLINIC_OR_DEPARTMENT_OTHER): Payer: Medicare HMO

## 2014-06-28 ENCOUNTER — Telehealth: Payer: Self-pay | Admitting: Internal Medicine

## 2014-06-28 ENCOUNTER — Ambulatory Visit (HOSPITAL_BASED_OUTPATIENT_CLINIC_OR_DEPARTMENT_OTHER): Payer: Medicare HMO | Admitting: Physician Assistant

## 2014-06-28 ENCOUNTER — Encounter: Payer: Self-pay | Admitting: Physician Assistant

## 2014-06-28 VITALS — BP 154/72 | HR 90 | Temp 98.0°F | Resp 18 | Ht 62.0 in | Wt 180.1 lb

## 2014-06-28 DIAGNOSIS — C3492 Malignant neoplasm of unspecified part of left bronchus or lung: Secondary | ICD-10-CM

## 2014-06-28 DIAGNOSIS — R21 Rash and other nonspecific skin eruption: Secondary | ICD-10-CM

## 2014-06-28 DIAGNOSIS — C3412 Malignant neoplasm of upper lobe, left bronchus or lung: Secondary | ICD-10-CM

## 2014-06-28 DIAGNOSIS — L299 Pruritus, unspecified: Secondary | ICD-10-CM | POA: Diagnosis not present

## 2014-06-28 DIAGNOSIS — M2550 Pain in unspecified joint: Secondary | ICD-10-CM

## 2014-06-28 LAB — CBC WITH DIFFERENTIAL/PLATELET
BASO%: 0.5 % (ref 0.0–2.0)
BASOS ABS: 0.1 10*3/uL (ref 0.0–0.1)
EOS ABS: 0.1 10*3/uL (ref 0.0–0.5)
EOS%: 0.5 % (ref 0.0–7.0)
HCT: 38.2 % (ref 34.8–46.6)
HGB: 12.2 g/dL (ref 11.6–15.9)
LYMPH%: 12.1 % — AB (ref 14.0–49.7)
MCH: 26.8 pg (ref 25.1–34.0)
MCHC: 31.9 g/dL (ref 31.5–36.0)
MCV: 83.9 fL (ref 79.5–101.0)
MONO#: 2 10*3/uL — ABNORMAL HIGH (ref 0.1–0.9)
MONO%: 6.9 % (ref 0.0–14.0)
NEUT#: 23.2 10*3/uL — ABNORMAL HIGH (ref 1.5–6.5)
NEUT%: 80 % — AB (ref 38.4–76.8)
Platelets: 295 10*3/uL (ref 145–400)
RBC: 4.56 10*6/uL (ref 3.70–5.45)
RDW: 14.8 % — ABNORMAL HIGH (ref 11.2–14.5)
WBC: 29 10*3/uL — ABNORMAL HIGH (ref 3.9–10.3)
lymph#: 3.5 10*3/uL — ABNORMAL HIGH (ref 0.9–3.3)

## 2014-06-28 LAB — COMPREHENSIVE METABOLIC PANEL (CC13)
ALT: 20 U/L (ref 0–55)
ANION GAP: 12 meq/L — AB (ref 3–11)
AST: 22 U/L (ref 5–34)
Albumin: 3.3 g/dL — ABNORMAL LOW (ref 3.5–5.0)
Alkaline Phosphatase: 162 U/L — ABNORMAL HIGH (ref 40–150)
BUN: 14.3 mg/dL (ref 7.0–26.0)
CALCIUM: 9.8 mg/dL (ref 8.4–10.4)
CHLORIDE: 108 meq/L (ref 98–109)
CO2: 23 meq/L (ref 22–29)
Creatinine: 0.8 mg/dL (ref 0.6–1.1)
EGFR: 79 mL/min/{1.73_m2} — ABNORMAL LOW (ref >90)
Glucose: 138 mg/dl (ref 70–140)
Potassium: 4.2 mEq/L (ref 3.5–5.1)
Sodium: 144 mEq/L (ref 136–145)
Total Bilirubin: 0.31 mg/dL (ref 0.20–1.20)
Total Protein: 6.9 g/dL (ref 6.4–8.3)

## 2014-06-28 MED ORDER — HYDROXYZINE HCL 10 MG PO TABS: ORAL_TABLET | ORAL | Status: DC

## 2014-06-28 MED ORDER — TRAMADOL HCL 50 MG PO TABS: 50.0000 mg | ORAL_TABLET | Freq: Four times a day (QID) | ORAL | Status: DC | PRN

## 2014-06-28 NOTE — Telephone Encounter (Signed)
Gave adn printed appt sched and avs for pt for June

## 2014-06-28 NOTE — Progress Notes (Signed)
No images are attached to the encounter. No scans are attached to the encounter. No scans are attached to the encounter. [place] [facility] SHARED VISIT PROGRESS NOTE  No PCP Per Patient No address on file  DIAGNOSIS: Lung cancer   Staging form: Lung, AJCC 7th Edition     Clinical: Stage IIA (T2b, N0, M0) - Signed by [name], MD on 05/31/2014  PRIOR THERAPY: Status post left upper lobectomy with lymph node dissection under the care of [name] on 05/04/2014  CURRENT THERAPY: Adjuvant chemotherapy with carboplatin for an AUC of 5 and paclitaxel 175 mg/m given every 3 weeks with Neulasta support. A total of 4 cycles of adjuvant chemotherapy are planned. Status post 1 cycle  INTERVAL HISTORY: [name] [name] 81 y.o. female returns for scheduled regular office visit for followup of her recently diagnosed stage II a (T2b, N0, M0) non-small cell lung cancer squamous cell carcinoma. She presents today for a symptom management visit after completing her first cycle of adjuvant chemotherapy with carboplatin and paclitaxel with Neulasta support. She reports that she has significant pains after the Neulasta injection and continues to have fleeting "stabbing pains" in the upper abdomen and her sides. The pain lasts only a few seconds. She reports that she did not tolerate the oxycodone. On the day of her Neulasta injection she took an oxycodone tablets later that evening. The next morning she had a generalized red itchy rash. She's not taking any further oxycodone tablets. Benadryl has not been helpful for the itching in any way.  She voices no specific complaints today. She brings in an application for [employer] to help with her transportation needs.  MEDICAL HISTORY: Past Medical History  Diagnosis Date  . Palpitations   . SOB (shortness of breath)   . COPD (chronic obstructive pulmonary disease)   . HTN (hypertension)   . Diabetes mellitus without complication   . Anxiety   .  Arthritis   . Cancer     ALLERGIES:  is allergic to darvon and lipitor.  MEDICATIONS:  Current Outpatient Prescriptions  Medication Sig Dispense Refill  . ALPRAZolam (XANAX) 0.5 MG tablet Take 0.5 mg by mouth 3 (three) times daily as needed for anxiety.    Marland Kitchen aspirin 81 MG tablet Take 81 mg by mouth daily.    . budesonide-formoterol (SYMBICORT) 80-4.5 MCG/ACT inhaler Inhale 2 puffs into the lungs 2 (two) times daily as needed (Shortness of Breath).    Marland Kitchen buPROPion (WELLBUTRIN SR) 150 MG 12 hr tablet Take 150 mg by mouth every 12 (twelve) hours.    . carboxymethylcellulose (REFRESH PLUS) 0.5 % SOLN Place 1 drop into both eyes daily as needed.    . CRESTOR 20 MG tablet     . Fluticasone-Salmeterol (ADVAIR) 250-50 MCG/DOSE AEPB Inhale 1 puff into the lungs 2 (two) times daily as needed (Shortness of Breath).    . loteprednol (LOTEMAX) 0.2 % SUSP Apply 1 drop to eye 2 (two) times daily. For 14 days    . metFORMIN (GLUCOPHAGE) 500 MG tablet Take 500 mg by mouth 2 (two) times daily with a meal.    . metoprolol tartrate (LOPRESSOR) 25 MG tablet Take 25 mg by mouth 2 (two) times daily.    . naproxen sodium (ANAPROX) 220 MG tablet Take 220 mg by mouth 2 (two) times daily as needed (pain).    . prochlorperazine (COMPAZINE) 10 MG tablet Take 1 tablet (10 mg total) by mouth every 6 (six) hours as needed for nausea or  vomiting. 30 tablet 1  . Propylene Glycol (SYSTANE BALANCE OP) Apply 1 drop to eye daily as needed (dry eyes).    . valsartan-hydrochlorothiazide (DIOVAN-HCT) 160-25 MG per tablet Take 1 tablet by mouth every morning.     Marland Kitchen atorvastatin (LIPITOR) 10 MG tablet Take 10 mg by mouth daily.    . hydrOXYzine (ATARAX/VISTARIL) 10 MG tablet Take 1 tablet by mouth every 8 hours as needed for itching 30 tablet 0  . oxyCODONE (OXY IR/ROXICODONE) 5 MG immediate release tablet Take 1 tablet (5 mg total) by mouth every 4 (four) hours as needed for severe pain. (Patient not taking: Reported on 06/28/2014) 30  tablet 0  . traMADol (ULTRAM) 50 MG tablet Take 1 tablet (50 mg total) by mouth every 6 (six) hours as needed. 30 tablet 0   No current facility-administered medications for this visit.    SURGICAL HISTORY:  Past Surgical History  Procedure Laterality Date  . Vaginal hysterectomy  1966  . Lung lobectomy  05/04/14    left upper lobectomy  . Video assisted thoracoscopy (vats)/ lobectomy Left 05/04/2014    Procedure: LEFT VIDEO ASSISTED THORACOSCOPY (VATS)/LEFT UPPER LOBECTOMY;  Surgeon: [name], MD;  Location: [facility];  Service: Thoracic;  Laterality: Left;  . Lead removal Left 05/04/2014    Procedure: CRYO INTERCOSTAL NERVE BLOCK;  Surgeon: [name], MD;  Location: [facility];  Service: Thoracic;  Laterality: Left;  . Node dissection Left 05/04/2014    Procedure: NODE DISSECTION;  Surgeon: [name], MD;  Location: [facility];  Service: Thoracic;  Laterality: Left;    REVIEW OF SYSTEMS:  Review of Systems  Constitutional: Negative for fever, chills, weight loss, malaise/fatigue and diaphoresis.  HENT: Negative for congestion, ear discharge, ear pain, hearing loss, nosebleeds, sore throat and tinnitus.   Eyes: Negative for blurred vision, double vision, photophobia, pain, discharge and redness.  Respiratory: Negative for cough, hemoptysis, sputum production, shortness of breath, wheezing and stridor.   Cardiovascular: Negative for chest pain, palpitations, orthopnea, claudication, leg swelling and PND.  Gastrointestinal: Negative for heartburn, nausea, vomiting, abdominal pain, diarrhea, constipation, blood in stool and melena.  Genitourinary: Negative.   Musculoskeletal: Positive for myalgias and joint pain.  Skin: Positive for itching and rash.  Neurological: Negative for dizziness, tingling, focal weakness, seizures, weakness and headaches.  Endo/Heme/Allergies: Does not bruise/bleed easily.  Psychiatric/Behavioral: Negative for depression. The patient is not  nervous/anxious and does not have insomnia.      PHYSICAL EXAMINATION: Physical Exam  Constitutional: She is oriented to person, place, and time and well-developed, well-nourished, and in no distress.  HENT:  Head: Normocephalic and atraumatic.  Mouth/Throat: Oropharynx is clear and moist.  Eyes: Pupils are equal, round, and reactive to light.  Neck: Normal range of motion. Neck supple. No JVD present. No tracheal deviation present. No thyromegaly present.  Cardiovascular: Normal rate, regular rhythm, normal heart sounds and intact distal pulses.  Exam reveals no gallop and no friction rub.   No murmur heard. Pulmonary/Chest: Effort normal and breath sounds normal. No respiratory distress. She has no wheezes. She has no rales.  Abdominal: Soft. Bowel sounds are normal. She exhibits no distension and no mass. There is no tenderness.  Musculoskeletal: Normal range of motion. She exhibits no edema or tenderness.  Lymphadenopathy:    She has no cervical adenopathy.  Neurological: She is alert and oriented to person, place, and time. She has normal reflexes. Gait normal.  Skin: Skin is warm and dry. Rash  noted.  Erythematous "wheal" type eruptions scattered over the patients trunk Well healed surgical incision on left side of chest      ECOG PERFORMANCE STATUS: 1 - Symptomatic but completely ambulatory  Blood pressure 154/72, pulse 90, temperature 98 F (36.7 C), resp. rate 18, height '5\' 2"'$  (1.575 m), weight 180 lb 1.6 oz (81.693 kg), SpO2 99 %.  LABORATORY DATA: Lab Results  Component Value Date   WBC 29.0* 06/28/2014   HGB 12.2 06/28/2014   HCT 38.2 06/28/2014   MCV 83.9 06/28/2014   PLT 295 06/28/2014      Chemistry      Component Value Date/Time   NA 144 06/28/2014 1419   NA 139 05/09/2014 0453   K 4.2 06/28/2014 1419   K 4.3 05/10/2014 0812   CL 105 05/09/2014 0453   CO2 23 06/28/2014 1419   CO2 31 05/09/2014 0453   BUN 14.3 06/28/2014 1419   BUN 13 05/09/2014 0453    CREATININE 0.8 06/28/2014 1419   CREATININE 0.98 05/09/2014 0453   CREATININE 1.00 04/11/2014 1120      Component Value Date/Time   CALCIUM 9.8 06/28/2014 1419   CALCIUM 8.7 05/09/2014 0453   ALKPHOS 162* 06/28/2014 1419   ALKPHOS 69 05/06/2014 0420   AST 22 06/28/2014 1419   AST 17 05/06/2014 0420   ALT 20 06/28/2014 1419   ALT 16 05/06/2014 0420   BILITOT 0.31 06/28/2014 1419   BILITOT 0.4 05/06/2014 0420       RADIOGRAPHIC STUDIES:  No results found.   ASSESSMENT/PLAN:  No problem-specific assessment & plan notes found for this encounter.  The patient is a pleasant 81 year old African-American female recently diagnosed with stage IIa (T2b, N0, M0) non-small cell lung cancer, squamous cell carcinoma. This was diagnosed in February 2016 and she is status post left upper lobectomy with lymph node dissection.  She is status post cycle #1 of her adjuvant chemotherapy with carboplatin for an AUC of 5 and paclitaxel 175 mg/m with Neulasta support. She tolerated chemotherapy relatively well but had significant pain and aching after the Neulasta injection. She took oxycodone for the pain and developed a rash within 12-24 hours primarily on her trunk that was red and itchy on relieved with Benadryl. The temporal Ms. of the rash is likely related to the oxycodone but could also be the Neulasta injection although the incidence is less likely. She will discontinue the oxycodone and I've given her prescription for tramadol 50 mg by mouth every 6 hours as needed for pain a total 30 tablets with no refill. For the itching sent a prescription for hydroxyzine 10 mg every 6 hours as needed for itching, total 30 tablets with no refill. She will continue with weekly labs as scheduled. And return in 2 weeks prior to the start of cycle #2 of her adjuvant chemotherapy. Her application for [employer] transportation was completed   Carlton Adam, PA-C 06/28/2014  All questions were answered. The patient  knows to call the clinic with any problems, questions or concerns. We can certainly see the patient much sooner if necessary.

## 2014-06-30 NOTE — Patient Instructions (Signed)
Stop taking the oxycodone. You may take tramadol as prescribed if needed for pain Take the hydroxyzine as prescribed for itching Continue with weekly labs as scheduled Follow-up in 2 weeks prior to the start of your next scheduled cycle of adjuvant chemotherapy

## 2014-07-05 ENCOUNTER — Other Ambulatory Visit (HOSPITAL_BASED_OUTPATIENT_CLINIC_OR_DEPARTMENT_OTHER): Payer: Medicare HMO

## 2014-07-05 DIAGNOSIS — C3412 Malignant neoplasm of upper lobe, left bronchus or lung: Secondary | ICD-10-CM

## 2014-07-05 DIAGNOSIS — C3492 Malignant neoplasm of unspecified part of left bronchus or lung: Secondary | ICD-10-CM

## 2014-07-05 LAB — COMPREHENSIVE METABOLIC PANEL (CC13)
ALT: 12 U/L (ref 0–55)
ANION GAP: 10 meq/L (ref 3–11)
AST: 14 U/L (ref 5–34)
Albumin: 3.2 g/dL — ABNORMAL LOW (ref 3.5–5.0)
Alkaline Phosphatase: 144 U/L (ref 40–150)
BUN: 10.6 mg/dL (ref 7.0–26.0)
CHLORIDE: 106 meq/L (ref 98–109)
CO2: 24 meq/L (ref 22–29)
Calcium: 9.6 mg/dL (ref 8.4–10.4)
Creatinine: 0.8 mg/dL (ref 0.6–1.1)
EGFR: 77 mL/min/{1.73_m2} — AB (ref >90)
Glucose: 160 mg/dl — ABNORMAL HIGH (ref 70–140)
POTASSIUM: 5 meq/L (ref 3.5–5.1)
Sodium: 140 mEq/L (ref 136–145)
Total Bilirubin: 0.23 mg/dL (ref 0.20–1.20)
Total Protein: 7 g/dL (ref 6.4–8.3)

## 2014-07-05 LAB — CBC WITH DIFFERENTIAL/PLATELET
BASO%: 0.2 % (ref 0.0–2.0)
Basophils Absolute: 0 10*3/uL (ref 0.0–0.1)
EOS%: 0.3 % (ref 0.0–7.0)
Eosinophils Absolute: 0.1 10*3/uL (ref 0.0–0.5)
HEMATOCRIT: 36.6 % (ref 34.8–46.6)
HGB: 12.3 g/dL (ref 11.6–15.9)
LYMPH%: 16 % (ref 14.0–49.7)
MCH: 27.9 pg (ref 25.1–34.0)
MCHC: 33.6 g/dL (ref 31.5–36.0)
MCV: 83 fL (ref 79.5–101.0)
MONO#: 0.8 10*3/uL (ref 0.1–0.9)
MONO%: 4.1 % (ref 0.0–14.0)
NEUT#: 14.9 10*3/uL — ABNORMAL HIGH (ref 1.5–6.5)
NEUT%: 79.4 % — ABNORMAL HIGH (ref 38.4–76.8)
PLATELETS: 125 10*3/uL — AB (ref 145–400)
RBC: 4.41 10*6/uL (ref 3.70–5.45)
RDW: 15.4 % — ABNORMAL HIGH (ref 11.2–14.5)
WBC: 18.7 10*3/uL — AB (ref 3.9–10.3)
lymph#: 3 10*3/uL (ref 0.9–3.3)
nRBC: 1 % — ABNORMAL HIGH (ref 0–0)

## 2014-07-12 ENCOUNTER — Telehealth: Payer: Self-pay | Admitting: Internal Medicine

## 2014-07-12 ENCOUNTER — Other Ambulatory Visit (HOSPITAL_BASED_OUTPATIENT_CLINIC_OR_DEPARTMENT_OTHER): Payer: Medicare HMO

## 2014-07-12 ENCOUNTER — Ambulatory Visit (HOSPITAL_BASED_OUTPATIENT_CLINIC_OR_DEPARTMENT_OTHER): Payer: Medicare HMO

## 2014-07-12 ENCOUNTER — Encounter: Payer: Self-pay | Admitting: Internal Medicine

## 2014-07-12 ENCOUNTER — Ambulatory Visit (HOSPITAL_BASED_OUTPATIENT_CLINIC_OR_DEPARTMENT_OTHER): Payer: Medicare HMO | Admitting: Internal Medicine

## 2014-07-12 VITALS — BP 167/74 | HR 97 | Temp 98.0°F | Resp 17 | Ht 62.0 in | Wt 180.0 lb

## 2014-07-12 DIAGNOSIS — C3412 Malignant neoplasm of upper lobe, left bronchus or lung: Secondary | ICD-10-CM | POA: Diagnosis not present

## 2014-07-12 DIAGNOSIS — Z5111 Encounter for antineoplastic chemotherapy: Secondary | ICD-10-CM

## 2014-07-12 DIAGNOSIS — C3492 Malignant neoplasm of unspecified part of left bronchus or lung: Secondary | ICD-10-CM

## 2014-07-12 LAB — CBC WITH DIFFERENTIAL/PLATELET
BASO%: 1.4 % (ref 0.0–2.0)
Basophils Absolute: 0.2 10*3/uL — ABNORMAL HIGH (ref 0.0–0.1)
EOS%: 0.7 % (ref 0.0–7.0)
Eosinophils Absolute: 0.1 10*3/uL (ref 0.0–0.5)
HCT: 34.4 % — ABNORMAL LOW (ref 34.8–46.6)
HGB: 11.1 g/dL — ABNORMAL LOW (ref 11.6–15.9)
LYMPH%: 23.2 % (ref 14.0–49.7)
MCH: 27.5 pg (ref 25.1–34.0)
MCHC: 32.4 g/dL (ref 31.5–36.0)
MCV: 84.9 fL (ref 79.5–101.0)
MONO#: 0.9 10*3/uL (ref 0.1–0.9)
MONO%: 7.3 % (ref 0.0–14.0)
NEUT%: 67.4 % (ref 38.4–76.8)
NEUTROS ABS: 8.7 10*3/uL — AB (ref 1.5–6.5)
Platelets: 472 10*3/uL — ABNORMAL HIGH (ref 145–400)
RBC: 4.05 10*6/uL (ref 3.70–5.45)
RDW: 15.4 % — ABNORMAL HIGH (ref 11.2–14.5)
WBC: 12.9 10*3/uL — ABNORMAL HIGH (ref 3.9–10.3)
lymph#: 3 10*3/uL (ref 0.9–3.3)

## 2014-07-12 LAB — COMPREHENSIVE METABOLIC PANEL (CC13)
ALK PHOS: 118 U/L (ref 40–150)
ALT: 10 U/L (ref 0–55)
AST: 12 U/L (ref 5–34)
Albumin: 3.1 g/dL — ABNORMAL LOW (ref 3.5–5.0)
Anion Gap: 13 mEq/L — ABNORMAL HIGH (ref 3–11)
BILIRUBIN TOTAL: 0.22 mg/dL (ref 0.20–1.20)
BUN: 15.3 mg/dL (ref 7.0–26.0)
CALCIUM: 9.7 mg/dL (ref 8.4–10.4)
CO2: 20 meq/L — AB (ref 22–29)
Chloride: 108 mEq/L (ref 98–109)
Creatinine: 0.8 mg/dL (ref 0.6–1.1)
EGFR: 78 mL/min/{1.73_m2} — ABNORMAL LOW (ref >90)
GLUCOSE: 181 mg/dL — AB (ref 70–140)
Potassium: 3.8 mEq/L (ref 3.5–5.1)
SODIUM: 141 meq/L (ref 136–145)
TOTAL PROTEIN: 7.4 g/dL (ref 6.4–8.3)

## 2014-07-12 MED ORDER — SODIUM CHLORIDE 0.9 % IV SOLN
419.5000 mg | Freq: Once | INTRAVENOUS | Status: AC
  Administered 2014-07-12: 420 mg via INTRAVENOUS
  Filled 2014-07-12: qty 42

## 2014-07-12 MED ORDER — SODIUM CHLORIDE 0.9 % IV SOLN
Freq: Once | INTRAVENOUS | Status: AC
  Administered 2014-07-12: 13:00:00 via INTRAVENOUS

## 2014-07-12 MED ORDER — FAMOTIDINE IN NACL 20-0.9 MG/50ML-% IV SOLN
20.0000 mg | Freq: Once | INTRAVENOUS | Status: AC
  Administered 2014-07-12: 20 mg via INTRAVENOUS

## 2014-07-12 MED ORDER — DIPHENHYDRAMINE HCL 50 MG/ML IJ SOLN
50.0000 mg | Freq: Once | INTRAMUSCULAR | Status: AC
  Administered 2014-07-12: 50 mg via INTRAVENOUS

## 2014-07-12 MED ORDER — PACLITAXEL CHEMO INJECTION 300 MG/50ML
175.0000 mg/m2 | Freq: Once | INTRAVENOUS | Status: AC
  Administered 2014-07-12: 330 mg via INTRAVENOUS
  Filled 2014-07-12: qty 55

## 2014-07-12 MED ORDER — FAMOTIDINE IN NACL 20-0.9 MG/50ML-% IV SOLN
INTRAVENOUS | Status: AC
  Filled 2014-07-12: qty 50

## 2014-07-12 MED ORDER — SODIUM CHLORIDE 0.9 % IV SOLN
Freq: Once | INTRAVENOUS | Status: AC
  Administered 2014-07-12: 14:00:00 via INTRAVENOUS
  Filled 2014-07-12: qty 8

## 2014-07-12 MED ORDER — DIPHENHYDRAMINE HCL 50 MG/ML IJ SOLN
INTRAMUSCULAR | Status: AC
  Filled 2014-07-12: qty 1

## 2014-07-12 NOTE — Patient Instructions (Signed)
Smoking Cessation Quitting smoking is important to your health and has many advantages. However, it is not always easy to quit since nicotine is a very addictive drug. Oftentimes, people try 3 times or more before being able to quit. This document explains the best ways for you to prepare to quit smoking. Quitting takes hard work and a lot of effort, but you can do it. ADVANTAGES OF QUITTING SMOKING  You will live longer, feel better, and live better.  Your body will feel the impact of quitting smoking almost immediately.  Within 20 minutes, blood pressure decreases. Your pulse returns to its normal level.  After 8 hours, carbon monoxide levels in the blood return to normal. Your oxygen level increases.  After 24 hours, the chance of having a heart attack starts to decrease. Your breath, hair, and body stop smelling like smoke.  After 48 hours, damaged nerve endings begin to recover. Your sense of taste and smell improve.  After 72 hours, the body is virtually free of nicotine. Your bronchial tubes relax and breathing becomes easier.  After 2 to 12 weeks, lungs can hold more air. Exercise becomes easier and circulation improves.  The risk of having a heart attack, stroke, cancer, or lung disease is greatly reduced.  After 1 year, the risk of coronary heart disease is cut in half.  After 5 years, the risk of stroke falls to the same as a nonsmoker.  After 10 years, the risk of lung cancer is cut in half and the risk of other cancers decreases significantly.  After 15 years, the risk of coronary heart disease drops, usually to the level of a nonsmoker.  If you are pregnant, quitting smoking will improve your chances of having a healthy baby.  The people you live with, especially any children, will be healthier.  You will have extra money to spend on things other than cigarettes. QUESTIONS TO THINK ABOUT BEFORE ATTEMPTING TO QUIT You may want to talk about your answers with your  health care provider.  Why do you want to quit?  If you tried to quit in the past, what helped and what did not?  What will be the most difficult situations for you after you quit? How will you plan to handle them?  Who can help you through the tough times? Your family? Friends? A health care provider?  What pleasures do you get from smoking? What ways can you still get pleasure if you quit? Here are some questions to ask your health care provider:  How can you help me to be successful at quitting?  What medicine do you think would be best for me and how should I take it?  What should I do if I need more help?  What is smoking withdrawal like? How can I get information on withdrawal? GET READY  Set a quit date.  Change your environment by getting rid of all cigarettes, ashtrays, matches, and lighters in your home, car, or work. Do not let people smoke in your home.  Review your past attempts to quit. Think about what worked and what did not. GET SUPPORT AND ENCOURAGEMENT You have a better chance of being successful if you have help. You can get support in many ways.  Tell your family, friends, and coworkers that you are going to quit and need their support. Ask them not to smoke around you.  Get individual, group, or telephone counseling and support. Programs are available at local hospitals and health centers. Call   your local health department for information about programs in your area.  Spiritual beliefs and practices may help some smokers quit.  Download a "quit meter" on your computer to keep track of quit statistics, such as how long you have gone without smoking, cigarettes not smoked, and money saved.  Get a self-help book about quitting smoking and staying off tobacco. LEARN NEW SKILLS AND BEHAVIORS  Distract yourself from urges to smoke. Talk to someone, go for a walk, or occupy your time with a task.  Change your normal routine. Take a different route to work.  Drink tea instead of coffee. Eat breakfast in a different place.  Reduce your stress. Take a hot bath, exercise, or read a book.  Plan something enjoyable to do every day. Reward yourself for not smoking.  Explore interactive web-based programs that specialize in helping you quit. GET MEDICINE AND USE IT CORRECTLY Medicines can help you stop smoking and decrease the urge to smoke. Combining medicine with the above behavioral methods and support can greatly increase your chances of successfully quitting smoking.  Nicotine replacement therapy helps deliver nicotine to your body without the negative effects and risks of smoking. Nicotine replacement therapy includes nicotine gum, lozenges, inhalers, nasal sprays, and skin patches. Some may be available over-the-counter and others require a prescription.  Antidepressant medicine helps people abstain from smoking, but how this works is unknown. This medicine is available by prescription.  Nicotinic receptor partial agonist medicine simulates the effect of nicotine in your brain. This medicine is available by prescription. Ask your health care provider for advice about which medicines to use and how to use them based on your health history. Your health care provider will tell you what side effects to look out for if you choose to be on a medicine or therapy. Carefully read the information on the package. Do not use any other product containing nicotine while using a nicotine replacement product.  RELAPSE OR DIFFICULT SITUATIONS Most relapses occur within the first 3 months after quitting. Do not be discouraged if you start smoking again. Remember, most people try several times before finally quitting. You may have symptoms of withdrawal because your body is used to nicotine. You may crave cigarettes, be irritable, feel very hungry, cough often, get headaches, or have difficulty concentrating. The withdrawal symptoms are only temporary. They are strongest  when you first quit, but they will go away within 10-14 days. To reduce the chances of relapse, try to:  Avoid drinking alcohol. Drinking lowers your chances of successfully quitting.  Reduce the amount of caffeine you consume. Once you quit smoking, the amount of caffeine in your body increases and can give you symptoms, such as a rapid heartbeat, sweating, and anxiety.  Avoid smokers because they can make you want to smoke.  Do not let weight gain distract you. Many smokers will gain weight when they quit, usually less than 10 pounds. Eat a healthy diet and stay active. You can always lose the weight gained after you quit.  Find ways to improve your mood other than smoking. FOR MORE INFORMATION  www.smokefree.gov  Document Released: 01/21/2001 Document Revised: 06/13/2013 Document Reviewed: 05/08/2011 [employer] Patient Information 2015 [employer]. This information is not intended to replace advice given to you by your health care provider. Make sure you discuss any questions you have with your health care provider.  

## 2014-07-12 NOTE — Telephone Encounter (Signed)
Gave and printed appt sched and avs for June and July

## 2014-07-12 NOTE — Telephone Encounter (Signed)
Gave and printed appt sched and avs for pt for June and JULY

## 2014-07-12 NOTE — Progress Notes (Signed)
[place] [phone]   [phone]  OFFICE PROGRESS NOTE  No PCP Per Patient No address on file  DIAGNOSIS: Lung cancer  Staging form: Lung, AJCC 7th Edition  Clinical: Stage IIA (T2b, N0, M0) - Signed by [name], MD on 05/31/2014  PRIOR THERAPY: Status post left upper lobectomy with lymph node dissection under the care of [name] on 05/04/2014  CURRENT THERAPY: Adjuvant chemotherapy with carboplatin for an AUC of 5 and paclitaxel 175 mg/m given every 3 weeks with Neulasta support. A total of 4 cycles of adjuvant chemotherapy are planned. Status post 1 cycle  INTERVAL HISTORY: [name] [name] 81 y.o. female returns to the clinic today for follow-up visit accompanied by her son and daughter. The patient tolerated the first cycle of her treatment of systemic chemotherapy with carboplatin and paclitaxel fairly well except for the aching pain and fatigue after the Neulasta injection. She denied having any significant fever or chills, no nausea or vomiting. The patient denied having any significant chest pain, shortness of breath, cough or hemoptysis. She is here today to start cycle #2 of her systemic chemotherapy.  MEDICAL HISTORY: Past Medical History  Diagnosis Date  . Palpitations   . SOB (shortness of breath)   . COPD (chronic obstructive pulmonary disease)   . HTN (hypertension)   . Diabetes mellitus without complication   . Anxiety   . Arthritis   . Cancer     ALLERGIES:  is allergic to darvon and lipitor.  MEDICATIONS:  Current Outpatient Prescriptions  Medication Sig Dispense Refill  . ALPRAZolam (XANAX) 0.5 MG tablet Take 0.5 mg by mouth 3 (three) times daily as needed for anxiety.    Marland Kitchen aspirin 81 MG tablet Take 81 mg by mouth daily.    . budesonide-formoterol (SYMBICORT) 80-4.5 MCG/ACT inhaler Inhale 2 puffs into the lungs 2 (two) times daily as needed (Shortness of Breath).    Marland Kitchen buPROPion (WELLBUTRIN SR) 150  MG 12 hr tablet Take 150 mg by mouth every 12 (twelve) hours.    . carboxymethylcellulose (REFRESH PLUS) 0.5 % SOLN Place 1 drop into both eyes daily as needed.    Marland Kitchen co-enzyme Q-10 50 MG capsule Take 50 mg by mouth daily.    . CRESTOR 20 MG tablet     . Fluticasone-Salmeterol (ADVAIR) 250-50 MCG/DOSE AEPB Inhale 1 puff into the lungs 2 (two) times daily as needed (Shortness of Breath).    . hydrOXYzine (ATARAX/VISTARIL) 10 MG tablet Take 1 tablet by mouth every 8 hours as needed for itching 30 tablet 0  . loteprednol (LOTEMAX) 0.2 % SUSP Apply 1 drop to eye 2 (two) times daily. For 14 days    . metFORMIN (GLUCOPHAGE) 500 MG tablet Take 500 mg by mouth 2 (two) times daily with a meal.    . metoprolol tartrate (LOPRESSOR) 25 MG tablet Take 25 mg by mouth 2 (two) times daily.    . naproxen sodium (ANAPROX) 220 MG tablet Take 220 mg by mouth 2 (two) times daily as needed (pain).    . Propylene Glycol (SYSTANE BALANCE OP) Apply 1 drop to eye daily as needed (dry eyes).    . valsartan-hydrochlorothiazide (DIOVAN-HCT) 160-25 MG per tablet Take 1 tablet by mouth every morning.     Marland Kitchen oxyCODONE (OXY IR/ROXICODONE) 5 MG immediate release tablet Take 1 tablet (5 mg total) by mouth every 4 (four) hours as needed for severe pain. (Patient not taking: Reported on 06/28/2014) 30 tablet 0  .  prochlorperazine (COMPAZINE) 10 MG tablet Take 1 tablet (10 mg total) by mouth every 6 (six) hours as needed for nausea or vomiting. (Patient not taking: Reported on 07/12/2014) 30 tablet 1  . traMADol (ULTRAM) 50 MG tablet Take 1 tablet (50 mg total) by mouth every 6 (six) hours as needed. (Patient not taking: Reported on 07/12/2014) 30 tablet 0   No current facility-administered medications for this visit.    SURGICAL HISTORY:  Past Surgical History  Procedure Laterality Date  . Vaginal hysterectomy  1966  . Lung lobectomy  05/04/14    left upper lobectomy  . Video assisted thoracoscopy (vats)/ lobectomy Left 05/04/2014     Procedure: LEFT VIDEO ASSISTED THORACOSCOPY (VATS)/LEFT UPPER LOBECTOMY;  Surgeon: [name], MD;  Location: [facility];  Service: Thoracic;  Laterality: Left;  . Lead removal Left 05/04/2014    Procedure: CRYO INTERCOSTAL NERVE BLOCK;  Surgeon: [name], MD;  Location: [facility];  Service: Thoracic;  Laterality: Left;  . Node dissection Left 05/04/2014    Procedure: NODE DISSECTION;  Surgeon: [name], MD;  Location: [facility];  Service: Thoracic;  Laterality: Left;    REVIEW OF SYSTEMS:  A comprehensive review of systems was negative except for: Constitutional: positive for fatigue   PHYSICAL EXAMINATION: General appearance: alert, cooperative, fatigued and no distress Head: Normocephalic, without obvious abnormality, atraumatic Neck: no adenopathy, no JVD, supple, symmetrical, trachea midline and thyroid not enlarged, symmetric, no tenderness/mass/nodules Lymph nodes: Cervical, supraclavicular, and axillary nodes normal. Resp: clear to auscultation bilaterally Back: symmetric, no curvature. ROM normal. No CVA tenderness. Cardio: regular rate and rhythm, S1, S2 normal, no murmur, click, rub or gallop GI: soft, non-tender; bowel sounds normal; no masses,  no organomegaly Extremities: extremities normal, atraumatic, no cyanosis or edema  ECOG PERFORMANCE STATUS: 1 - Symptomatic but completely ambulatory  Blood pressure 167/74, pulse 97, temperature 98 F (36.7 C), temperature source Oral, resp. rate 17, height '5\' 2"'$  (1.575 m), weight 180 lb (81.647 kg).  LABORATORY DATA: Lab Results  Component Value Date   WBC 12.9* 07/12/2014   HGB 11.1* 07/12/2014   HCT 34.4* 07/12/2014   MCV 84.9 07/12/2014   PLT 472* 07/12/2014      Chemistry      Component Value Date/Time   NA 141 07/12/2014 1041   NA 139 05/09/2014 0453   K 3.8 07/12/2014 1041   K 4.3 05/10/2014 0812   CL 105 05/09/2014 0453   CO2 20* 07/12/2014 1041   CO2 31 05/09/2014 0453   BUN 15.3 07/12/2014  1041   BUN 13 05/09/2014 0453   CREATININE 0.8 07/12/2014 1041   CREATININE 0.98 05/09/2014 0453   CREATININE 1.00 04/11/2014 1120      Component Value Date/Time   CALCIUM 9.7 07/12/2014 1041   CALCIUM 8.7 05/09/2014 0453   ALKPHOS 118 07/12/2014 1041   ALKPHOS 69 05/06/2014 0420   AST 12 07/12/2014 1041   AST 17 05/06/2014 0420   ALT 10 07/12/2014 1041   ALT 16 05/06/2014 0420   BILITOT 0.22 07/12/2014 1041   BILITOT 0.4 05/06/2014 0420       RADIOGRAPHIC STUDIES: No results found.  ASSESSMENT AND PLAN: This is a very pleasant 81 years old African-American female with a stage IIA non-small cell lung cancer, status post left upper lobectomy with lymph node dissection and currently undergoing adjuvant chemotherapy with carboplatin and paclitaxel status post 1 cycle.  The patient tolerated the first cycle of her treatment fairly well except  for the aching pain after the Neulasta injection. I recommended for her to take Claritin 1 tablet by mouth daily for 5 days after the Neulasta injection in addition to her pain medication. We will proceed with cycle #2 today as a scheduled. The patient would come back for follow-up visit in 3 weeks for reevaluation before starting cycle #3. She was advised to call immediately if she has any concerning symptoms in the interval. The patient voices understanding of current disease status and treatment options and is in agreement with the current care plan.  All questions were answered. The patient knows to call the clinic with any problems, questions or concerns. We can certainly see the patient much sooner if necessary.  Disclaimer: This note was dictated with voice recognition software. Similar sounding words can inadvertently be transcribed and may not be corrected upon review.

## 2014-07-12 NOTE — Patient Instructions (Signed)
[facility] Discharge Instructions for Patients Receiving Chemotherapy  Today you received the following chemotherapy agents: Taxol, Carboplatin  To help prevent nausea and vomiting after your treatment, we encourage you to take your nausea medication as prescribed by your physician.   If you develop nausea and vomiting that is not controlled by your nausea medication, call the clinic.   BELOW ARE SYMPTOMS THAT SHOULD BE REPORTED IMMEDIATELY:  *FEVER GREATER THAN 100.5 F  *CHILLS WITH OR WITHOUT FEVER  NAUSEA AND VOMITING THAT IS NOT CONTROLLED WITH YOUR NAUSEA MEDICATION  *UNUSUAL SHORTNESS OF BREATH  *UNUSUAL BRUISING OR BLEEDING  TENDERNESS IN MOUTH AND THROAT WITH OR WITHOUT PRESENCE OF ULCERS  *URINARY PROBLEMS  *BOWEL PROBLEMS  UNUSUAL RASH Items with * indicate a potential emergency and should be followed up as soon as possible.  Feel free to call the clinic you have any questions or concerns. The clinic phone number is [phone].  Please show the CHEMO ALERT CARD at check-in to the Emergency Department and triage nurse.   

## 2014-07-14 ENCOUNTER — Ambulatory Visit (HOSPITAL_BASED_OUTPATIENT_CLINIC_OR_DEPARTMENT_OTHER): Payer: Medicare HMO

## 2014-07-14 VITALS — BP 146/78 | HR 83 | Temp 98.3°F

## 2014-07-14 DIAGNOSIS — C3412 Malignant neoplasm of upper lobe, left bronchus or lung: Secondary | ICD-10-CM | POA: Diagnosis not present

## 2014-07-14 DIAGNOSIS — Z5189 Encounter for other specified aftercare: Secondary | ICD-10-CM | POA: Diagnosis not present

## 2014-07-14 MED ORDER — PEGFILGRASTIM INJECTION 6 MG/0.6ML ~~LOC~~
6.0000 mg | PREFILLED_SYRINGE | Freq: Once | SUBCUTANEOUS | Status: AC
  Administered 2014-07-14: 6 mg via SUBCUTANEOUS
  Filled 2014-07-14: qty 0.6

## 2014-07-19 ENCOUNTER — Other Ambulatory Visit (HOSPITAL_BASED_OUTPATIENT_CLINIC_OR_DEPARTMENT_OTHER): Payer: Medicare HMO

## 2014-07-19 DIAGNOSIS — C3492 Malignant neoplasm of unspecified part of left bronchus or lung: Secondary | ICD-10-CM

## 2014-07-19 DIAGNOSIS — C3412 Malignant neoplasm of upper lobe, left bronchus or lung: Secondary | ICD-10-CM | POA: Diagnosis not present

## 2014-07-19 LAB — CBC WITH DIFFERENTIAL/PLATELET
BASO%: 0.3 % (ref 0.0–2.0)
BASOS ABS: 0.1 10*3/uL (ref 0.0–0.1)
EOS ABS: 0.1 10*3/uL (ref 0.0–0.5)
EOS%: 0.6 % (ref 0.0–7.0)
HCT: 33.8 % — ABNORMAL LOW (ref 34.8–46.6)
HEMOGLOBIN: 10.9 g/dL — AB (ref 11.6–15.9)
LYMPH#: 3.7 10*3/uL — AB (ref 0.9–3.3)
LYMPH%: 15.7 % (ref 14.0–49.7)
MCH: 27.3 pg (ref 25.1–34.0)
MCHC: 32.2 g/dL (ref 31.5–36.0)
MCV: 84.8 fL (ref 79.5–101.0)
MONO#: 0.9 10*3/uL (ref 0.1–0.9)
MONO%: 3.7 % (ref 0.0–14.0)
NEUT%: 79.7 % — AB (ref 38.4–76.8)
NEUTROS ABS: 19 10*3/uL — AB (ref 1.5–6.5)
Platelets: 350 10*3/uL (ref 145–400)
RBC: 3.99 10*6/uL (ref 3.70–5.45)
RDW: 15.6 % — ABNORMAL HIGH (ref 11.2–14.5)
WBC: 23.8 10*3/uL — ABNORMAL HIGH (ref 3.9–10.3)

## 2014-07-19 LAB — COMPREHENSIVE METABOLIC PANEL (CC13)
ALBUMIN: 3.2 g/dL — AB (ref 3.5–5.0)
ALT: 10 U/L (ref 0–55)
AST: 16 U/L (ref 5–34)
Alkaline Phosphatase: 165 U/L — ABNORMAL HIGH (ref 40–150)
Anion Gap: 8 mEq/L (ref 3–11)
BILIRUBIN TOTAL: 0.22 mg/dL (ref 0.20–1.20)
BUN: 21.8 mg/dL (ref 7.0–26.0)
CO2: 24 mEq/L (ref 22–29)
Calcium: 9.3 mg/dL (ref 8.4–10.4)
Chloride: 111 mEq/L — ABNORMAL HIGH (ref 98–109)
Creatinine: 0.8 mg/dL (ref 0.6–1.1)
EGFR: 84 mL/min/{1.73_m2} — ABNORMAL LOW (ref >90)
Glucose: 166 mg/dl — ABNORMAL HIGH (ref 70–140)
Potassium: 4.6 mEq/L (ref 3.5–5.1)
SODIUM: 143 meq/L (ref 136–145)
TOTAL PROTEIN: 7 g/dL (ref 6.4–8.3)

## 2014-07-24 ENCOUNTER — Other Ambulatory Visit: Payer: Self-pay | Admitting: Thoracic Surgery (Cardiothoracic Vascular Surgery)

## 2014-07-24 DIAGNOSIS — Z902 Acquired absence of lung [part of]: Secondary | ICD-10-CM

## 2014-07-25 ENCOUNTER — Emergency Department (HOSPITAL_COMMUNITY)
Admission: EM | Admit: 2014-07-25 | Discharge: 2014-07-25 | Disposition: A | Discharge status: Home / Self Care | Payer: Medicare HMO | Attending: Emergency Medicine | Admitting: Emergency Medicine

## 2014-07-25 ENCOUNTER — Ambulatory Visit (INDEPENDENT_AMBULATORY_CARE_PROVIDER_SITE_OTHER): Payer: Self-pay | Admitting: Thoracic Surgery (Cardiothoracic Vascular Surgery)

## 2014-07-25 ENCOUNTER — Encounter: Payer: Self-pay | Admitting: Thoracic Surgery (Cardiothoracic Vascular Surgery)

## 2014-07-25 ENCOUNTER — Encounter (HOSPITAL_COMMUNITY): Payer: Self-pay | Admitting: Emergency Medicine

## 2014-07-25 ENCOUNTER — Ambulatory Visit
Admission: RE | Admit: 2014-07-25 | Discharge: 2014-07-25 | Disposition: A | Discharge status: Home / Self Care | Payer: Medicare HMO | Source: Ambulatory Visit | Attending: Thoracic Surgery (Cardiothoracic Vascular Surgery) | Admitting: Thoracic Surgery (Cardiothoracic Vascular Surgery)

## 2014-07-25 VITALS — BP 218/123 | HR 94 | Resp 18 | Ht 62.0 in | Wt 180.0 lb

## 2014-07-25 DIAGNOSIS — R531 Weakness: Secondary | ICD-10-CM | POA: Diagnosis not present

## 2014-07-25 DIAGNOSIS — M1990 Unspecified osteoarthritis, unspecified site: Secondary | ICD-10-CM | POA: Diagnosis not present

## 2014-07-25 DIAGNOSIS — Z7982 Long term (current) use of aspirin: Secondary | ICD-10-CM | POA: Insufficient documentation

## 2014-07-25 DIAGNOSIS — I10 Essential (primary) hypertension: Secondary | ICD-10-CM | POA: Diagnosis present

## 2014-07-25 DIAGNOSIS — Z87891 Personal history of nicotine dependence: Secondary | ICD-10-CM | POA: Diagnosis not present

## 2014-07-25 DIAGNOSIS — Z79899 Other long term (current) drug therapy: Secondary | ICD-10-CM | POA: Insufficient documentation

## 2014-07-25 DIAGNOSIS — J449 Chronic obstructive pulmonary disease, unspecified: Secondary | ICD-10-CM | POA: Diagnosis not present

## 2014-07-25 DIAGNOSIS — Z859 Personal history of malignant neoplasm, unspecified: Secondary | ICD-10-CM | POA: Diagnosis not present

## 2014-07-25 DIAGNOSIS — C3492 Malignant neoplasm of unspecified part of left bronchus or lung: Secondary | ICD-10-CM

## 2014-07-25 DIAGNOSIS — E119 Type 2 diabetes mellitus without complications: Secondary | ICD-10-CM | POA: Insufficient documentation

## 2014-07-25 DIAGNOSIS — F419 Anxiety disorder, unspecified: Secondary | ICD-10-CM | POA: Diagnosis not present

## 2014-07-25 DIAGNOSIS — Z902 Acquired absence of lung [part of]: Secondary | ICD-10-CM

## 2014-07-25 DIAGNOSIS — Z9889 Other specified postprocedural states: Secondary | ICD-10-CM

## 2014-07-25 LAB — CBC WITH DIFFERENTIAL/PLATELET
BASOS ABS: 0 10*3/uL (ref 0.0–0.1)
Basophils Relative: 0 % (ref 0–1)
Eosinophils Absolute: 0 10*3/uL (ref 0.0–0.7)
Eosinophils Relative: 0 % (ref 0–5)
HEMATOCRIT: 36.2 % (ref 36.0–46.0)
Hemoglobin: 12 g/dL (ref 12.0–15.0)
LYMPHS ABS: 3.1 10*3/uL (ref 0.7–4.0)
Lymphocytes Relative: 14 % (ref 12–46)
MCH: 27.7 pg (ref 26.0–34.0)
MCHC: 33.1 g/dL (ref 30.0–36.0)
MCV: 83.6 fL (ref 78.0–100.0)
MONO ABS: 0.9 10*3/uL (ref 0.1–1.0)
MONOS PCT: 4 % (ref 3–12)
Neutro Abs: 18.3 10*3/uL — ABNORMAL HIGH (ref 1.7–7.7)
Neutrophils Relative %: 82 % — ABNORMAL HIGH (ref 43–77)
Platelets: 168 10*3/uL (ref 150–400)
RBC: 4.33 MIL/uL (ref 3.87–5.11)
RDW: 16.4 % — AB (ref 11.5–15.5)
WBC: 22.3 10*3/uL — AB (ref 4.0–10.5)

## 2014-07-25 LAB — BASIC METABOLIC PANEL
Anion gap: 11 (ref 5–15)
BUN: 16 mg/dL (ref 6–20)
CALCIUM: 9.5 mg/dL (ref 8.9–10.3)
CO2: 20 mmol/L — AB (ref 22–32)
CREATININE: 0.77 mg/dL (ref 0.44–1.00)
Chloride: 110 mmol/L (ref 101–111)
GFR calc Af Amer: >60 mL/min (ref >60)
GFR calc non Af Amer: >60 mL/min (ref >60)
GLUCOSE: 89 mg/dL (ref 65–99)
Potassium: 3.9 mmol/L (ref 3.5–5.1)
Sodium: 141 mmol/L (ref 135–145)

## 2014-07-25 MED ORDER — IRBESARTAN 150 MG PO TABS
150.0000 mg | ORAL_TABLET | Freq: Every day | ORAL | Status: DC
Start: 2014-07-25 — End: 2014-07-25
  Administered 2014-07-25: 150 mg via ORAL
  Filled 2014-07-25: qty 1

## 2014-07-25 MED ORDER — METOPROLOL TARTRATE 25 MG PO TABS
25.0000 mg | ORAL_TABLET | Freq: Once | ORAL | Status: AC
  Administered 2014-07-25: 25 mg via ORAL
  Filled 2014-07-25: qty 1

## 2014-07-25 MED ORDER — VALSARTAN-HYDROCHLOROTHIAZIDE 160-25 MG PO TABS: 1.0000 | ORAL_TABLET | Freq: Once | ORAL | Status: DC

## 2014-07-25 MED ORDER — HYDROCHLOROTHIAZIDE 25 MG PO TABS
25.0000 mg | ORAL_TABLET | Freq: Every day | ORAL | Status: DC
Start: 2014-07-25 — End: 2014-07-25
  Administered 2014-07-25: 25 mg via ORAL
  Filled 2014-07-25: qty 1

## 2014-07-25 NOTE — ED Notes (Signed)
Pt. Stated, i was sent over from Dr. Hans Eden office for BP problems. I've had high BP before but not this high.

## 2014-07-25 NOTE — Discharge Instructions (Signed)

## 2014-07-25 NOTE — Progress Notes (Signed)
[address]       [place] [address]             [phone]       HPI:  [name] [name] returns today for scheduled 3 month follow-up visit.  She is a 81 year old former smoker who had a thoracoscopic left upper lobectomy for stage IIA non-small cell carcinoma on 05/04/2014. She recently started chemotherapy and has had 2 cycles of the for that her planned so far. She complains that she has a lot of pain after chemotherapy. She also complains that she was not warned of that possibility. She does admit that she did not take pain medication as recommended.  She experiences some pain in the area of her incision in her left anterior chest associated with chemotherapy. She really does not have much pain there otherwise. She has not smoked since her surgery. Her weight is stable. She is not having any shortness of breath.  Past Medical History  Diagnosis Date  . Palpitations   . SOB (shortness of breath)   . COPD (chronic obstructive pulmonary disease)   . HTN (hypertension)   . Diabetes mellitus without complication   . Anxiety   . Arthritis   . Cancer      Current Outpatient Prescriptions  Medication Sig Dispense Refill  . ALPRAZolam (XANAX) 0.5 MG tablet Take 0.5 mg by mouth 3 (three) times daily as needed for anxiety.    Marland Kitchen aspirin 81 MG tablet Take 81 mg by mouth daily.    . budesonide-formoterol (SYMBICORT) 80-4.5 MCG/ACT inhaler Inhale 2 puffs into the lungs 2 (two) times daily as needed (Shortness of Breath).    Marland Kitchen buPROPion (WELLBUTRIN SR) 150 MG 12 hr tablet Take 150 mg by mouth every 12 (twelve) hours.    . carboxymethylcellulose (REFRESH PLUS) 0.5 % SOLN Place 1 drop into both eyes daily as needed.    Marland Kitchen co-enzyme Q-10 50 MG capsule Take 50 mg by mouth daily.    . CRESTOR 20 MG tablet     . Fluticasone-Salmeterol (ADVAIR) 250-50 MCG/DOSE AEPB Inhale 1 puff into the lungs 2 (two) times daily as needed (Shortness of Breath).    . hydrOXYzine  (ATARAX/VISTARIL) 10 MG tablet Take 1 tablet by mouth every 8 hours as needed for itching 30 tablet 0  . loteprednol (LOTEMAX) 0.2 % SUSP Apply 1 drop to eye 2 (two) times daily. For 14 days    . metFORMIN (GLUCOPHAGE) 500 MG tablet Take 500 mg by mouth 2 (two) times daily with a meal.    . metoprolol tartrate (LOPRESSOR) 25 MG tablet Take 25 mg by mouth 2 (two) times daily.    . naproxen sodium (ANAPROX) 220 MG tablet Take 220 mg by mouth 2 (two) times daily as needed (pain).    . Propylene Glycol (SYSTANE BALANCE OP) Apply 1 drop to eye daily as needed (dry eyes).    . valsartan-hydrochlorothiazide (DIOVAN-HCT) 160-25 MG per tablet Take 1 tablet by mouth every morning.     Marland Kitchen oxyCODONE (OXY IR/ROXICODONE) 5 MG immediate release tablet Take 1 tablet (5 mg total) by mouth every 4 (four) hours as needed for severe pain. (Patient not taking: Reported on 07/25/2014) 30 tablet 0  . prochlorperazine (COMPAZINE) 10 MG tablet Take 1 tablet (10 mg total) by mouth every 6 (six) hours as needed for nausea or vomiting. (Patient not taking: Reported on 07/25/2014) 30 tablet 1  . traMADol (ULTRAM) 50 MG tablet Take 1  tablet (50 mg total) by mouth every 6 (six) hours as needed. (Patient not taking: Reported on 07/12/2014) 30 tablet 0   No current facility-administered medications for this visit.    Physical Exam BP 218/123 mmHg  Pulse 94  Resp 18  Ht '5\' 2"'$  (1.575 m)  Wt 180 lb (81.647 kg)  BMI 32.91 kg/m2  SpO13 47% 81 year old obese woman in no acute distress Alert and oriented x 3 with no focal neurologic deficits No cervical or subclavicular adenopathy Lungs with diminished breath sounds left side Cardiac regular rate and rhythm normal S1 and S2 Incisions well healed  Diagnostic Tests: Chest x-ray reviewed. It shows expected postoperative changes on the left side  Impression: 81 year old woman who is now 3 months out from a thoracoscopic left upper lobectomy for stage II a non-small cell carcinoma.  She is currently in the midst of chemotherapy and has received 2 more planned cycles.  From a surgical standpoint she's doing well. She is having a lot of side effects from the chemotherapy. I think that can be mitigated if she'll take her medications is advised.  The primary concern today is her severe hypertension with a blood pressure 218/123. She says that she was "stressed" this morning. She did not take her blood pressure medication and was running several errands prior to coming to the office. We will check her blood pressure again in about 10-15 minutes but she still over 200 she will need to go to the emergency room.  Plan: Take medications as prescribed  I will see her back in about 3 months for 6 month follow-up visit.  [name], MD [facility] [phone]

## 2014-07-25 NOTE — ED Provider Notes (Signed)
CSN: [id number]     Arrival date & time 07/25/14  1211 History   First MD Initiated Contact with Patient 07/25/14 1227     Chief Complaint  Patient presents with  . Hypertension     (Consider location/radiation/quality/duration/timing/severity/associated sxs/prior Treatment) HPI  81 year old female presents from the CT surgery office with elevated blood pressure. The patient has a history of high blood pressure but did not take her medicines this morning. In March 2016 she had a VATS and was at a normal postop visit today. It was noted that her blood pressure was over 034 systolic. The patient did not take her blood pressure this morning because she forgot it was busy. She ran multiple errands and then went to the office. She denies headache, blurry vision, chest pain, or more shortness of breath than typical since surgery. She denies any vomiting. She feels generally weak since she's been on chemotherapy but no focal weakness.  Past Medical History  Diagnosis Date  . Palpitations   . SOB (shortness of breath)   . COPD (chronic obstructive pulmonary disease)   . HTN (hypertension)   . Diabetes mellitus without complication   . Anxiety   . Arthritis   . Cancer    Past Surgical History  Procedure Laterality Date  . Vaginal hysterectomy  1966  . Lung lobectomy  05/04/14    left upper lobectomy  . Video assisted thoracoscopy (vats)/ lobectomy Left 05/04/2014    Procedure: LEFT VIDEO ASSISTED THORACOSCOPY (VATS)/LEFT UPPER LOBECTOMY;  Surgeon: [name], MD;  Location: [facility];  Service: Thoracic;  Laterality: Left;  . Lead removal Left 05/04/2014    Procedure: CRYO INTERCOSTAL NERVE BLOCK;  Surgeon: [name], MD;  Location: [facility];  Service: Thoracic;  Laterality: Left;  . Node dissection Left 05/04/2014    Procedure: NODE DISSECTION;  Surgeon: [name], MD;  Location: [facility];  Service: Thoracic;  Laterality: Left;   Family History  Problem Relation Age of  Onset  . Cervical cancer Mother   . Prostate cancer Father   . Stroke Maternal Grandmother   . Angina Maternal Grandfather   . Prostate cancer Paternal Uncle    History  Substance Use Topics  . Smoking status: Former Smoker -- 0.25 packs/day for 69 years    Types: Cigarettes    Quit date: 05/03/2014  . Smokeless tobacco: Not on file     Comment: Quit 05/03/14  . Alcohol Use: No   OB History    No data available     Review of Systems  Constitutional: Negative for fever.  Eyes: Negative for visual disturbance.  Respiratory: Negative for shortness of breath.   Cardiovascular: Negative for chest pain.  Gastrointestinal: Negative for vomiting and abdominal pain.  Neurological: Positive for weakness (generalized since starting chemo). Negative for headaches.  All other systems reviewed and are negative.     Allergies  Darvon and Lipitor  Home Medications   Prior to Admission medications   Medication Sig Start Date End Date Taking? Authorizing Provider  ALPRAZolam Duanne Moron) 0.5 MG tablet Take 0.5 mg by mouth 3 (three) times daily as needed for anxiety.    Historical Provider, MD  aspirin 81 MG tablet Take 81 mg by mouth daily.    Historical Provider, MD  budesonide-formoterol (SYMBICORT) 80-4.5 MCG/ACT inhaler Inhale 2 puffs into the lungs 2 (two) times daily as needed (Shortness of Breath).    Historical Provider, MD  buPROPion (WELLBUTRIN SR) 150 MG 12 hr tablet  Take 150 mg by mouth every 12 (twelve) hours.    Historical Provider, MD  carboxymethylcellulose (REFRESH PLUS) 0.5 % SOLN Place 1 drop into both eyes daily as needed.    Historical Provider, MD  co-enzyme Q-10 50 MG capsule Take 50 mg by mouth daily.    Historical Provider, MD  CRESTOR 20 MG tablet  06/27/14   Historical Provider, MD  Fluticasone-Salmeterol (ADVAIR) 250-50 MCG/DOSE AEPB Inhale 1 puff into the lungs 2 (two) times daily as needed (Shortness of Breath).    Historical Provider, MD  hydrOXYzine  (ATARAX/VISTARIL) 10 MG tablet Take 1 tablet by mouth every 8 hours as needed for itching 06/28/14   Adrena E Johnson, PA-C  loteprednol (LOTEMAX) 0.2 % SUSP Apply 1 drop to eye 2 (two) times daily. For 14 days    Historical Provider, MD  metFORMIN (GLUCOPHAGE) 500 MG tablet Take 500 mg by mouth 2 (two) times daily with a meal.    Historical Provider, MD  metoprolol tartrate (LOPRESSOR) 25 MG tablet Take 25 mg by mouth 2 (two) times daily.    Historical Provider, MD  naproxen sodium (ANAPROX) 220 MG tablet Take 220 mg by mouth 2 (two) times daily as needed (pain).    Historical Provider, MD  oxyCODONE (OXY IR/ROXICODONE) 5 MG immediate release tablet Take 1 tablet (5 mg total) by mouth every 4 (four) hours as needed for severe pain. Patient not taking: Reported on 07/25/2014 05/10/14   Donielle Liston Alba, PA-C  prochlorperazine (COMPAZINE) 10 MG tablet Take 1 tablet (10 mg total) by mouth every 6 (six) hours as needed for nausea or vomiting. Patient not taking: Reported on 07/25/2014 06/21/14   Burnetta Sabin E Johnson, PA-C  Propylene Glycol (SYSTANE BALANCE OP) Apply 1 drop to eye daily as needed (dry eyes).    Historical Provider, MD  traMADol (ULTRAM) 50 MG tablet Take 1 tablet (50 mg total) by mouth every 6 (six) hours as needed. Patient not taking: Reported on 07/12/2014 06/28/14   Burnetta Sabin E Johnson, PA-C  valsartan-hydrochlorothiazide (DIOVAN-HCT) 160-25 MG per tablet Take 1 tablet by mouth every morning.     Historical Provider, MD   BP 198/104 mmHg  Pulse 89  Temp(Src) 97.6 F (36.4 C) (Oral)  Resp 18  Ht 5' 2.5" (1.588 m)  Wt 180 lb (81.647 kg)  BMI 32.38 kg/m2  SpO2 99% Physical Exam  Constitutional: She is oriented to person, place, and time. She appears well-developed and well-nourished.  HENT:  Head: Normocephalic and atraumatic.  Right Ear: External ear normal.  Left Ear: External ear normal.  Nose: Nose normal.  Eyes: EOM are normal. Pupils are equal, round, and reactive to light.  Right eye exhibits no discharge. Left eye exhibits no discharge.  Cardiovascular: Normal rate, regular rhythm and normal heart sounds.   Pulmonary/Chest: Effort normal and breath sounds normal.  Abdominal: Soft. She exhibits no distension. There is no tenderness.  Neurological: She is alert and oriented to person, place, and time.  CN 2-12 grossly intact. 5/5 strength in all 4 extremities. Grossly normal sensation  Skin: Skin is warm and dry.  Nursing note and vitals reviewed.   ED Course  Procedures (including critical care time) Labs Review Labs Reviewed  BASIC METABOLIC PANEL - Abnormal; Notable for the following:    CO2 20 (*)    All other components within normal limits  CBC WITH DIFFERENTIAL/PLATELET - Abnormal; Notable for the following:    WBC 22.3 (*)    RDW 16.4 (*)  Neutrophils Relative % 82 (*)    Neutro Abs 18.3 (*)    All other components within normal limits    Imaging Review Dg Chest 2 View  07/25/2014   CLINICAL DATA:  81 year old female status post lobectomy and therapy for lung cancer. Shortness of Breath. Subsequent encounter.  EXAM: CHEST  2 VIEW  COMPARISON:  05/23/2014 and earlier.  FINDINGS: Volume reduction in the left hemi thorax again noted. Stable cardiac size and mediastinal contours. The previously seen anterior left hydropneumothorax is no longer visible, but anterior and basilar left pleural effusion likely is still present. No pulmonary edema or acute pulmonary opacity. Stable visualized osseous structures.  IMPRESSION: Interval resolved component of left pleural gas. Residual left pleural effusion suspected but may be expected postoperatively in this post lobectomy patient.  No new cardiopulmonary abnormality.   Electronically Signed   By: Genevie Ann M.D.   On: 07/25/2014 11:14     EKG Interpretation None      MDM   Final diagnoses:  Essential hypertension    Patient is asymptomatic with HTN. BP has improved, will give her normal daily  medicines for BP as she does not need an acute IV lowering of BP. Renal funtion at baseline, has elevated WBC but this is not new and is likely related to chemo. No ACS or CVA symptoms. D/C with close f/u with PCP.    Sherwood Gambler, MD 07/25/14 1745

## 2014-07-26 ENCOUNTER — Other Ambulatory Visit (HOSPITAL_BASED_OUTPATIENT_CLINIC_OR_DEPARTMENT_OTHER): Payer: Medicare HMO

## 2014-07-26 DIAGNOSIS — C3412 Malignant neoplasm of upper lobe, left bronchus or lung: Secondary | ICD-10-CM | POA: Diagnosis not present

## 2014-07-26 DIAGNOSIS — C3492 Malignant neoplasm of unspecified part of left bronchus or lung: Secondary | ICD-10-CM

## 2014-07-26 LAB — CBC WITH DIFFERENTIAL/PLATELET
BASO%: 0.1 % (ref 0.0–2.0)
Basophils Absolute: 0 10*3/uL (ref 0.0–0.1)
EOS%: 0.4 % (ref 0.0–7.0)
Eosinophils Absolute: 0.1 10*3/uL (ref 0.0–0.5)
HCT: 35.1 % (ref 34.8–46.6)
HGB: 11.8 g/dL (ref 11.6–15.9)
LYMPH%: 21.4 % (ref 14.0–49.7)
MCH: 28.2 pg (ref 25.1–34.0)
MCHC: 33.6 g/dL (ref 31.5–36.0)
MCV: 84 fL (ref 79.5–101.0)
MONO#: 0.6 10*3/uL (ref 0.1–0.9)
MONO%: 4 % (ref 0.0–14.0)
NEUT%: 74.1 % (ref 38.4–76.8)
NEUTROS ABS: 11 10*3/uL — AB (ref 1.5–6.5)
PLATELETS: 143 10*3/uL — AB (ref 145–400)
RBC: 4.18 10*6/uL (ref 3.70–5.45)
RDW: 16.5 % — AB (ref 11.2–14.5)
WBC: 14.8 10*3/uL — AB (ref 3.9–10.3)
lymph#: 3.2 10*3/uL (ref 0.9–3.3)

## 2014-07-26 LAB — COMPREHENSIVE METABOLIC PANEL (CC13)
ALK PHOS: 169 U/L — AB (ref 40–150)
ALT: 32 U/L (ref 0–55)
AST: 29 U/L (ref 5–34)
Albumin: 3.4 g/dL — ABNORMAL LOW (ref 3.5–5.0)
Anion Gap: 10 mEq/L (ref 3–11)
BUN: 16.3 mg/dL (ref 7.0–26.0)
CO2: 23 mEq/L (ref 22–29)
Calcium: 9.7 mg/dL (ref 8.4–10.4)
Chloride: 107 mEq/L (ref 98–109)
Creatinine: 0.8 mg/dL (ref 0.6–1.1)
EGFR: 78 mL/min/{1.73_m2} — AB (ref >90)
Glucose: 110 mg/dl (ref 70–140)
POTASSIUM: 4.3 meq/L (ref 3.5–5.1)
Sodium: 140 mEq/L (ref 136–145)
TOTAL PROTEIN: 7.4 g/dL (ref 6.4–8.3)
Total Bilirubin: 0.3 mg/dL (ref 0.20–1.20)

## 2014-08-02 ENCOUNTER — Other Ambulatory Visit: Payer: Self-pay | Admitting: *Deleted

## 2014-08-02 ENCOUNTER — Encounter: Payer: Self-pay | Admitting: Physician Assistant

## 2014-08-02 ENCOUNTER — Telehealth: Payer: Self-pay | Admitting: Physician Assistant

## 2014-08-02 ENCOUNTER — Other Ambulatory Visit (HOSPITAL_BASED_OUTPATIENT_CLINIC_OR_DEPARTMENT_OTHER): Payer: Medicare HMO

## 2014-08-02 ENCOUNTER — Ambulatory Visit (HOSPITAL_BASED_OUTPATIENT_CLINIC_OR_DEPARTMENT_OTHER): Payer: Medicare HMO | Admitting: Physician Assistant

## 2014-08-02 ENCOUNTER — Ambulatory Visit (HOSPITAL_BASED_OUTPATIENT_CLINIC_OR_DEPARTMENT_OTHER): Payer: Medicare HMO

## 2014-08-02 ENCOUNTER — Ambulatory Visit: Payer: Self-pay

## 2014-08-02 VITALS — BP 132/63 | HR 84 | Temp 98.3°F | Resp 19 | Ht 62.5 in | Wt 182.2 lb

## 2014-08-02 DIAGNOSIS — C3492 Malignant neoplasm of unspecified part of left bronchus or lung: Secondary | ICD-10-CM

## 2014-08-02 DIAGNOSIS — C3412 Malignant neoplasm of upper lobe, left bronchus or lung: Secondary | ICD-10-CM

## 2014-08-02 DIAGNOSIS — Z5111 Encounter for antineoplastic chemotherapy: Secondary | ICD-10-CM | POA: Diagnosis not present

## 2014-08-02 LAB — CBC WITH DIFFERENTIAL/PLATELET
BASO%: 0.5 % (ref 0.0–2.0)
Basophils Absolute: 0.1 10*3/uL (ref 0.0–0.1)
EOS ABS: 0 10*3/uL (ref 0.0–0.5)
EOS%: 0.3 % (ref 0.0–7.0)
HCT: 35.8 % (ref 34.8–46.6)
HEMOGLOBIN: 11.7 g/dL (ref 11.6–15.9)
LYMPH#: 2.6 10*3/uL (ref 0.9–3.3)
LYMPH%: 20.7 % (ref 14.0–49.7)
MCH: 27.7 pg (ref 25.1–34.0)
MCHC: 32.6 g/dL (ref 31.5–36.0)
MCV: 85.1 fL (ref 79.5–101.0)
MONO#: 1 10*3/uL — ABNORMAL HIGH (ref 0.1–0.9)
MONO%: 8.1 % (ref 0.0–14.0)
NEUT#: 9 10*3/uL — ABNORMAL HIGH (ref 1.5–6.5)
NEUT%: 70.4 % (ref 38.4–76.8)
Platelets: 261 10*3/uL (ref 145–400)
RBC: 4.21 10*6/uL (ref 3.70–5.45)
RDW: 16.7 % — ABNORMAL HIGH (ref 11.2–14.5)
WBC: 12.7 10*3/uL — AB (ref 3.9–10.3)

## 2014-08-02 LAB — COMPREHENSIVE METABOLIC PANEL (CC13)
ALBUMIN: 3.4 g/dL — AB (ref 3.5–5.0)
ALT: 15 U/L (ref 0–55)
ANION GAP: 9 meq/L (ref 3–11)
AST: 14 U/L (ref 5–34)
Alkaline Phosphatase: 117 U/L (ref 40–150)
BUN: 23.6 mg/dL (ref 7.0–26.0)
CALCIUM: 9.7 mg/dL (ref 8.4–10.4)
CO2: 25 meq/L (ref 22–29)
Chloride: 108 mEq/L (ref 98–109)
Creatinine: 0.9 mg/dL (ref 0.6–1.1)
EGFR: 69 mL/min/{1.73_m2} — AB (ref >90)
GLUCOSE: 107 mg/dL (ref 70–140)
POTASSIUM: 4.1 meq/L (ref 3.5–5.1)
Sodium: 142 mEq/L (ref 136–145)
Total Bilirubin: 0.2 mg/dL (ref 0.20–1.20)
Total Protein: 7.1 g/dL (ref 6.4–8.3)

## 2014-08-02 MED ORDER — DIPHENHYDRAMINE HCL 50 MG/ML IJ SOLN
50.0000 mg | Freq: Once | INTRAMUSCULAR | Status: AC
  Administered 2014-08-02: 50 mg via INTRAVENOUS

## 2014-08-02 MED ORDER — SODIUM CHLORIDE 0.9 % IV SOLN
Freq: Once | INTRAVENOUS | Status: AC
  Administered 2014-08-02: 13:00:00 via INTRAVENOUS
  Filled 2014-08-02: qty 8

## 2014-08-02 MED ORDER — DIPHENHYDRAMINE HCL 50 MG/ML IJ SOLN
INTRAMUSCULAR | Status: AC
  Filled 2014-08-02: qty 1

## 2014-08-02 MED ORDER — SODIUM CHLORIDE 0.9 % IV SOLN
419.5000 mg | Freq: Once | INTRAVENOUS | Status: AC
  Administered 2014-08-02: 420 mg via INTRAVENOUS
  Filled 2014-08-02: qty 42

## 2014-08-02 MED ORDER — FAMOTIDINE IN NACL 20-0.9 MG/50ML-% IV SOLN
20.0000 mg | Freq: Once | INTRAVENOUS | Status: AC
  Administered 2014-08-02: 20 mg via INTRAVENOUS

## 2014-08-02 MED ORDER — FAMOTIDINE IN NACL 20-0.9 MG/50ML-% IV SOLN
INTRAVENOUS | Status: AC
  Filled 2014-08-02: qty 50

## 2014-08-02 MED ORDER — SODIUM CHLORIDE 0.9 % IV SOLN
Freq: Once | INTRAVENOUS | Status: AC
  Administered 2014-08-02: 13:00:00 via INTRAVENOUS

## 2014-08-02 MED ORDER — PACLITAXEL CHEMO INJECTION 300 MG/50ML
175.0000 mg/m2 | Freq: Once | INTRAVENOUS | Status: AC
  Administered 2014-08-02: 330 mg via INTRAVENOUS
  Filled 2014-08-02: qty 55

## 2014-08-02 NOTE — Telephone Encounter (Signed)
Gave avs & calendar for July °

## 2014-08-02 NOTE — Patient Instructions (Signed)
[facility] Discharge Instructions for Patients Receiving Chemotherapy  Today you received the following chemotherapy agents, Taxol, Carboplatin  To help prevent nausea and vomiting after your treatment, we encourage you to take your nausea medication as directed.    If you develop nausea and vomiting that is not controlled by your nausea medication, call the clinic.   BELOW ARE SYMPTOMS THAT SHOULD BE REPORTED IMMEDIATELY:  *FEVER GREATER THAN 100.5 F  *CHILLS WITH OR WITHOUT FEVER  NAUSEA AND VOMITING THAT IS NOT CONTROLLED WITH YOUR NAUSEA MEDICATION  *UNUSUAL SHORTNESS OF BREATH  *UNUSUAL BRUISING OR BLEEDING  TENDERNESS IN MOUTH AND THROAT WITH OR WITHOUT PRESENCE OF ULCERS  *URINARY PROBLEMS  *BOWEL PROBLEMS  UNUSUAL RASH Items with * indicate a potential emergency and should be followed up as soon as possible.  Feel free to call the clinic you have any questions or concerns. The clinic phone number is [phone].  Please show the [place] at check-in to the Emergency Department and triage nurse.

## 2014-08-02 NOTE — Progress Notes (Signed)
Pt notified this RN that R posterior hand IV was leaking. Noted scant amount of liquid between pt's fingers. Carboplatin infusion paused. Changed IV site dressing. Noted IV tubing was loose. Tightened connection, infused normal saline. No leaking noted. Resumed infusion. Cleaned pt's hand. No erythema, edema or pain noted at site. + blood return.

## 2014-08-02 NOTE — Progress Notes (Addendum)
[place] [phone]   [phone]  OFFICE PROGRESS NOTE  No PCP Per Patient No address on file  DIAGNOSIS: Lung cancer  Staging form: Lung, AJCC 7th Edition  Clinical: Stage IIA (T2b, N0, M0) - Signed by [name], MD on 05/31/2014  PRIOR THERAPY: Status post left upper lobectomy with lymph node dissection under the care of [name] on 05/04/2014  CURRENT THERAPY: Adjuvant chemotherapy with carboplatin for an AUC of 5 and paclitaxel 175 mg/m given every 3 weeks with Neulasta support. A total of 4 cycles of adjuvant chemotherapy are planned. Status post 2 cycles  INTERVAL HISTORY: [name] [name] 81 y.o. female returns to the clinic today for follow-up visit accompanied by her son and daughter. The patient is tolerating her treatment of systemic chemotherapy with carboplatin and paclitaxel fairly well except for the aching pain and fatigue after the Neulasta injection. The pain after the Neulasta injection was much better managed by taking Claritin and her pain medication. She reports that she was evaluated by [name] however sent directly to the emergency room for significantly increased blood pressure. She admits that she had not been taking her blood pressure medication as prescribed and had missed several doses. She denied having any significant fever or chills, no nausea or vomiting. The patient denied having any significant chest pain, shortness of breath, cough or hemoptysis. She is here today to start cycle #3 of her systemic chemotherapy.  MEDICAL HISTORY: Past Medical History  Diagnosis Date  . Palpitations   . SOB (shortness of breath)   . COPD (chronic obstructive pulmonary disease)   . HTN (hypertension)   . Diabetes mellitus without complication   . Anxiety   . Arthritis   . Cancer     ALLERGIES:  is allergic to darvon and lipitor.  MEDICATIONS:  Current Outpatient Prescriptions  Medication Sig  Dispense Refill  . ALPRAZolam (XANAX) 0.5 MG tablet Take 0.5 mg by mouth 3 (three) times daily as needed for anxiety.    Marland Kitchen aspirin 81 MG tablet Take 81 mg by mouth daily.    . budesonide-formoterol (SYMBICORT) 80-4.5 MCG/ACT inhaler Inhale 2 puffs into the lungs 2 (two) times daily as needed (Shortness of Breath).    Marland Kitchen buPROPion (WELLBUTRIN SR) 150 MG 12 hr tablet Take 150 mg by mouth every 12 (twelve) hours.    . carboxymethylcellulose (REFRESH PLUS) 0.5 % SOLN Place 1 drop into both eyes daily as needed.    Marland Kitchen co-enzyme Q-10 50 MG capsule Take 50 mg by mouth daily.    . CRESTOR 20 MG tablet Take 20 mg by mouth daily.     . Fluticasone-Salmeterol (ADVAIR) 250-50 MCG/DOSE AEPB Inhale 1 puff into the lungs 2 (two) times daily as needed (Shortness of Breath).    . hydrOXYzine (ATARAX/VISTARIL) 10 MG tablet Take 1 tablet by mouth every 8 hours as needed for itching 30 tablet 0  . loteprednol (LOTEMAX) 0.2 % SUSP Apply 1 drop to eye 2 (two) times daily. For 14 days    . metFORMIN (GLUCOPHAGE) 500 MG tablet Take 500 mg by mouth 2 (two) times daily with a meal.    . metoprolol tartrate (LOPRESSOR) 25 MG tablet Take 25 mg by mouth 2 (two) times daily.    . naproxen sodium (ANAPROX) 220 MG tablet Take 220 mg by mouth 2 (two) times daily as needed (pain).    . Propylene Glycol (SYSTANE BALANCE OP) Apply 1 drop to eye daily  as needed (dry eyes).    . valsartan-hydrochlorothiazide (DIOVAN-HCT) 160-25 MG per tablet Take 1 tablet by mouth every morning.      No current facility-administered medications for this visit.   Facility-Administered Medications Ordered in Other Visits  Medication Dose Route Frequency Provider Last Rate Last Dose  . CARBOplatin (PARAPLATIN) 420 mg in sodium chloride 0.9 % 250 mL chemo infusion  420 mg Intravenous Once [name], MD      . PACLitaxel (TAXOL) 330 mg in dextrose 5 % 500 mL chemo infusion (> '80mg'$ /m2)  175 mg/m2 (Treatment Plan Actual) Intravenous Once [name], MD 185 mL/hr at 08/02/14 1354 330 mg at 08/02/14 1354    SURGICAL HISTORY:  Past Surgical History  Procedure Laterality Date  . Vaginal hysterectomy  1966  . Lung lobectomy  05/04/14    left upper lobectomy  . Video assisted thoracoscopy (vats)/ lobectomy Left 05/04/2014    Procedure: LEFT VIDEO ASSISTED THORACOSCOPY (VATS)/LEFT UPPER LOBECTOMY;  Surgeon: [name], MD;  Location: [place];  Service: Thoracic;  Laterality: Left;  . Lead removal Left 05/04/2014    Procedure: CRYO INTERCOSTAL NERVE BLOCK;  Surgeon: [name], MD;  Location: [place];  Service: Thoracic;  Laterality: Left;  . Node dissection Left 05/04/2014    Procedure: NODE DISSECTION;  Surgeon: [name], MD;  Location: [place];  Service: Thoracic;  Laterality: Left;    REVIEW OF SYSTEMS:  A comprehensive review of systems was negative except for: Constitutional: positive for fatigue   PHYSICAL EXAMINATION: General appearance: alert, cooperative, fatigued and no distress Head: Normocephalic, without obvious abnormality, atraumatic Neck: no adenopathy, no JVD, supple, symmetrical, trachea midline and thyroid not enlarged, symmetric, no tenderness/mass/nodules Lymph nodes: Cervical, supraclavicular, and axillary nodes normal. Resp: clear to auscultation bilaterally Back: symmetric, no curvature. ROM normal. No CVA tenderness. Cardio: regular rate and rhythm, S1, S2 normal, no murmur, click, rub or gallop GI: soft, non-tender; bowel sounds normal; no masses,  no organomegaly Extremities: extremities normal, atraumatic, no cyanosis or edema  ECOG PERFORMANCE STATUS: 1 - Symptomatic but completely ambulatory  Blood pressure 132/63, pulse 84, temperature 98.3 F (36.8 C), temperature source Oral, resp. rate 19, height 5' 2.5" (1.588 m), weight 182 lb 3.2 oz (82.645 kg), SpO2 93 %.  LABORATORY DATA: Lab Results  Component Value Date   WBC 12.7* 08/02/2014   HGB 11.7 08/02/2014   HCT 35.8  08/02/2014   MCV 85.1 08/02/2014   PLT 261 08/02/2014      Chemistry      Component Value Date/Time   NA 142 08/02/2014 1033   NA 141 07/25/2014 1318   K 4.1 08/02/2014 1033   K 3.9 07/25/2014 1318   CL 110 07/25/2014 1318   CO2 25 08/02/2014 1033   CO2 20* 07/25/2014 1318   BUN 23.6 08/02/2014 1033   BUN 16 07/25/2014 1318   CREATININE 0.9 08/02/2014 1033   CREATININE 0.77 07/25/2014 1318   CREATININE 1.00 04/11/2014 1120      Component Value Date/Time   CALCIUM 9.7 08/02/2014 1033   CALCIUM 9.5 07/25/2014 1318   ALKPHOS 117 08/02/2014 1033   ALKPHOS 69 05/06/2014 0420   AST 14 08/02/2014 1033   AST 17 05/06/2014 0420   ALT 15 08/02/2014 1033   ALT 16 05/06/2014 0420   BILITOT 0.20 08/02/2014 1033   BILITOT 0.4 05/06/2014 0420       RADIOGRAPHIC STUDIES: Dg Chest 2 View  07/25/2014   CLINICAL DATA:  81 year old female status post lobectomy and therapy for lung cancer. Shortness of Breath. Subsequent encounter.  EXAM: CHEST  2 VIEW  COMPARISON:  05/23/2014 and earlier.  FINDINGS: Volume reduction in the left hemi thorax again noted. Stable cardiac size and mediastinal contours. The previously seen anterior left hydropneumothorax is no longer visible, but anterior and basilar left pleural effusion likely is still present. No pulmonary edema or acute pulmonary opacity. Stable visualized osseous structures.  IMPRESSION: Interval resolved component of left pleural gas. Residual left pleural effusion suspected but may be expected postoperatively in this post lobectomy patient.  No new cardiopulmonary abnormality.   Electronically Signed   By: Genevie Ann M.D.   On: 07/25/2014 11:14    ASSESSMENT AND PLAN: This is a very pleasant 81 years old African-American female with a stage IIA non-small cell lung cancer, status post left upper lobectomy with lymph node dissection and currently undergoing adjuvant chemotherapy with carboplatin and paclitaxel status post 1 cycle.  The patient is  tolerating her treatment fairly well except for the aching pain after the Neulasta injection. She will continue to take Claritin 1 tablet by mouth daily for 5 days after the Neulasta injection in addition to her pain medication. The patient was discussed with and also seen by Dr. Julien Nordmann. We will proceed with cycle #3 today as a scheduled. Her blood pressure is better controlled today and she is encouraged to take her antihypertensives medications as prescribed. She'll follow-up in 3 weeks prior to the start of cycle #4. She will continue with weekly labs in the interim.  She was advised to call immediately if she has any concerning symptoms in the interval. The patient voices understanding of current disease status and treatment options and is in agreement with the current care plan.  All questions were answered. The patient knows to call the clinic with any problems, questions or concerns. We can certainly see the patient much sooner if necessary.  Carlton Adam, PA-C 08/02/2014   ADDENDUM: Hematology/Oncology Attending: I had a face to face encounter with the patient. I recommended her care plan. This is a very pleasant 81 years old African-American female with a stage IIa non-small cell lung cancer status post left upper lobectomy and she is currently undergoing adjuvant systemic chemotherapy with carboplatin and paclitaxel is status post 2 cycles. The patient is tolerating her treatment much better with less aching pain after the Neulasta injection. I recommended for her to proceed with cycle #3 today as a scheduled. She would come back for follow-up visit in 3 weeks for reevaluation before starting cycle #4. She was advised to call immediately if she has any concerning symptoms in the interval.  Disclaimer: This note was dictated with voice recognition software. Similar sounding words can inadvertently be transcribed and may not be corrected upon review. Eilleen Kempf.,  MD 08/07/2014

## 2014-08-03 ENCOUNTER — Ambulatory Visit: Payer: Self-pay

## 2014-08-04 ENCOUNTER — Ambulatory Visit (HOSPITAL_BASED_OUTPATIENT_CLINIC_OR_DEPARTMENT_OTHER): Payer: Medicare HMO

## 2014-08-04 VITALS — BP 130/55 | HR 75 | Temp 97.7°F

## 2014-08-04 DIAGNOSIS — C3412 Malignant neoplasm of upper lobe, left bronchus or lung: Secondary | ICD-10-CM | POA: Diagnosis not present

## 2014-08-04 DIAGNOSIS — Z5189 Encounter for other specified aftercare: Secondary | ICD-10-CM | POA: Diagnosis not present

## 2014-08-04 MED ORDER — PEGFILGRASTIM INJECTION 6 MG/0.6ML ~~LOC~~
6.0000 mg | PREFILLED_SYRINGE | Freq: Once | SUBCUTANEOUS | Status: AC
  Administered 2014-08-04: 6 mg via SUBCUTANEOUS
  Filled 2014-08-04: qty 0.6

## 2014-08-06 NOTE — Patient Instructions (Signed)
Continue weekly labs as scheduled All 3 weeks prior to your next scheduled cycle of chemotherapy Take your pressure medications as prescribed

## 2014-08-09 ENCOUNTER — Other Ambulatory Visit (HOSPITAL_BASED_OUTPATIENT_CLINIC_OR_DEPARTMENT_OTHER): Payer: Medicare HMO

## 2014-08-09 DIAGNOSIS — C3492 Malignant neoplasm of unspecified part of left bronchus or lung: Secondary | ICD-10-CM

## 2014-08-09 DIAGNOSIS — C3412 Malignant neoplasm of upper lobe, left bronchus or lung: Secondary | ICD-10-CM

## 2014-08-09 LAB — CBC WITH DIFFERENTIAL/PLATELET
BASO%: 0.1 % (ref 0.0–2.0)
Basophils Absolute: 0 10*3/uL (ref 0.0–0.1)
EOS ABS: 0.1 10*3/uL (ref 0.0–0.5)
EOS%: 0.6 % (ref 0.0–7.0)
HCT: 33.5 % — ABNORMAL LOW (ref 34.8–46.6)
HGB: 11.2 g/dL — ABNORMAL LOW (ref 11.6–15.9)
LYMPH%: 13.2 % — ABNORMAL LOW (ref 14.0–49.7)
MCH: 28.4 pg (ref 25.1–34.0)
MCHC: 33.4 g/dL (ref 31.5–36.0)
MCV: 84.8 fL (ref 79.5–101.0)
MONO#: 1.1 10*3/uL — ABNORMAL HIGH (ref 0.1–0.9)
MONO%: 4.9 % (ref 0.0–14.0)
NEUT%: 81.2 % — ABNORMAL HIGH (ref 38.4–76.8)
NEUTROS ABS: 17.7 10*3/uL — AB (ref 1.5–6.5)
Platelets: 243 10*3/uL (ref 145–400)
RBC: 3.95 10*6/uL (ref 3.70–5.45)
RDW: 16 % — ABNORMAL HIGH (ref 11.2–14.5)
WBC: 21.8 10*3/uL — ABNORMAL HIGH (ref 3.9–10.3)
lymph#: 2.9 10*3/uL (ref 0.9–3.3)

## 2014-08-09 LAB — COMPREHENSIVE METABOLIC PANEL (CC13)
ALK PHOS: 163 U/L — AB (ref 40–150)
ALT: 14 U/L (ref 0–55)
ANION GAP: 9 meq/L (ref 3–11)
AST: 14 U/L (ref 5–34)
Albumin: 3.5 g/dL (ref 3.5–5.0)
BILIRUBIN TOTAL: 0.37 mg/dL (ref 0.20–1.20)
BUN: 38.2 mg/dL — AB (ref 7.0–26.0)
CO2: 20 mEq/L — ABNORMAL LOW (ref 22–29)
CREATININE: 1.3 mg/dL — AB (ref 0.6–1.1)
Calcium: 9.6 mg/dL (ref 8.4–10.4)
Chloride: 109 mEq/L (ref 98–109)
EGFR: 46 mL/min/{1.73_m2} — ABNORMAL LOW (ref >90)
Glucose: 141 mg/dl — ABNORMAL HIGH (ref 70–140)
Potassium: 4.6 mEq/L (ref 3.5–5.1)
SODIUM: 138 meq/L (ref 136–145)
Total Protein: 7.2 g/dL (ref 6.4–8.3)

## 2014-08-16 ENCOUNTER — Other Ambulatory Visit (HOSPITAL_BASED_OUTPATIENT_CLINIC_OR_DEPARTMENT_OTHER): Payer: Medicare HMO

## 2014-08-16 DIAGNOSIS — C3412 Malignant neoplasm of upper lobe, left bronchus or lung: Secondary | ICD-10-CM

## 2014-08-16 DIAGNOSIS — C3492 Malignant neoplasm of unspecified part of left bronchus or lung: Secondary | ICD-10-CM

## 2014-08-16 LAB — COMPREHENSIVE METABOLIC PANEL (CC13)
ALBUMIN: 3.5 g/dL (ref 3.5–5.0)
ALK PHOS: 149 U/L (ref 40–150)
ALT: 32 U/L (ref 0–55)
AST: 29 U/L (ref 5–34)
Anion Gap: 10 mEq/L (ref 3–11)
BUN: 20.4 mg/dL (ref 7.0–26.0)
CHLORIDE: 113 meq/L — AB (ref 98–109)
CO2: 19 mEq/L — ABNORMAL LOW (ref 22–29)
Calcium: 9.5 mg/dL (ref 8.4–10.4)
Creatinine: 1 mg/dL (ref 0.6–1.1)
EGFR: 63 mL/min/{1.73_m2} — ABNORMAL LOW (ref >90)
Glucose: 129 mg/dl (ref 70–140)
Potassium: 4.1 mEq/L (ref 3.5–5.1)
SODIUM: 141 meq/L (ref 136–145)
Total Bilirubin: 0.2 mg/dL (ref 0.20–1.20)
Total Protein: 7.2 g/dL (ref 6.4–8.3)

## 2014-08-16 LAB — CBC WITH DIFFERENTIAL/PLATELET
BASO%: 0.2 % (ref 0.0–2.0)
Basophils Absolute: 0 10*3/uL (ref 0.0–0.1)
EOS%: 0.1 % (ref 0.0–7.0)
Eosinophils Absolute: 0 10*3/uL (ref 0.0–0.5)
HCT: 34 % — ABNORMAL LOW (ref 34.8–46.6)
HGB: 11.2 g/dL — ABNORMAL LOW (ref 11.6–15.9)
LYMPH#: 2.9 10*3/uL (ref 0.9–3.3)
LYMPH%: 15.1 % (ref 14.0–49.7)
MCH: 28.3 pg (ref 25.1–34.0)
MCHC: 32.8 g/dL (ref 31.5–36.0)
MCV: 86.1 fL (ref 79.5–101.0)
MONO#: 0.7 10*3/uL (ref 0.1–0.9)
MONO%: 3.4 % (ref 0.0–14.0)
NEUT#: 15.7 10*3/uL — ABNORMAL HIGH (ref 1.5–6.5)
NEUT%: 81.2 % — ABNORMAL HIGH (ref 38.4–76.8)
Platelets: 202 10*3/uL (ref 145–400)
RBC: 3.95 10*6/uL (ref 3.70–5.45)
RDW: 17 % — ABNORMAL HIGH (ref 11.2–14.5)
WBC: 19.4 10*3/uL — AB (ref 3.9–10.3)

## 2014-08-21 ENCOUNTER — Encounter (HOSPITAL_COMMUNITY): Payer: Self-pay

## 2014-08-21 ENCOUNTER — Ambulatory Visit (HOSPITAL_COMMUNITY)
Admission: RE | Admit: 2014-08-21 | Discharge: 2014-08-21 | Disposition: A | Discharge status: Home / Self Care | Payer: Medicare HMO | Source: Ambulatory Visit | Attending: Physician Assistant | Admitting: Physician Assistant

## 2014-08-21 DIAGNOSIS — C3412 Malignant neoplasm of upper lobe, left bronchus or lung: Secondary | ICD-10-CM

## 2014-08-21 DIAGNOSIS — J439 Emphysema, unspecified: Secondary | ICD-10-CM | POA: Diagnosis not present

## 2014-08-21 DIAGNOSIS — C3492 Malignant neoplasm of unspecified part of left bronchus or lung: Secondary | ICD-10-CM | POA: Insufficient documentation

## 2014-08-21 MED ORDER — IOHEXOL 300 MG/ML  SOLN
100.0000 mL | Freq: Once | INTRAMUSCULAR | Status: AC | PRN
  Administered 2014-08-21: 80 mL via INTRAVENOUS

## 2014-08-23 ENCOUNTER — Telehealth: Payer: Self-pay | Admitting: Physician Assistant

## 2014-08-23 ENCOUNTER — Other Ambulatory Visit (HOSPITAL_BASED_OUTPATIENT_CLINIC_OR_DEPARTMENT_OTHER): Payer: Medicare HMO

## 2014-08-23 ENCOUNTER — Ambulatory Visit (HOSPITAL_BASED_OUTPATIENT_CLINIC_OR_DEPARTMENT_OTHER): Payer: Medicare HMO

## 2014-08-23 ENCOUNTER — Encounter: Payer: Self-pay | Admitting: Physician Assistant

## 2014-08-23 ENCOUNTER — Ambulatory Visit (HOSPITAL_BASED_OUTPATIENT_CLINIC_OR_DEPARTMENT_OTHER): Payer: Medicare HMO | Admitting: Physician Assistant

## 2014-08-23 VITALS — BP 154/75 | HR 75 | Temp 98.0°F | Resp 19 | Ht 62.5 in | Wt 180.8 lb

## 2014-08-23 DIAGNOSIS — Z5111 Encounter for antineoplastic chemotherapy: Secondary | ICD-10-CM

## 2014-08-23 DIAGNOSIS — C3412 Malignant neoplasm of upper lobe, left bronchus or lung: Secondary | ICD-10-CM | POA: Diagnosis not present

## 2014-08-23 DIAGNOSIS — C3492 Malignant neoplasm of unspecified part of left bronchus or lung: Secondary | ICD-10-CM

## 2014-08-23 LAB — COMPREHENSIVE METABOLIC PANEL (CC13)
ALT: 15 U/L (ref 0–55)
AST: 15 U/L (ref 5–34)
Albumin: 3.5 g/dL (ref 3.5–5.0)
Alkaline Phosphatase: 112 U/L (ref 40–150)
Anion Gap: 8 mEq/L (ref 3–11)
BUN: 16.5 mg/dL (ref 7.0–26.0)
CO2: 25 mEq/L (ref 22–29)
Calcium: 9.8 mg/dL (ref 8.4–10.4)
Chloride: 108 mEq/L (ref 98–109)
Creatinine: 0.8 mg/dL (ref 0.6–1.1)
EGFR: 81 mL/min/{1.73_m2} — ABNORMAL LOW (ref >90)
GLUCOSE: 71 mg/dL (ref 70–140)
Potassium: 4.2 mEq/L (ref 3.5–5.1)
SODIUM: 141 meq/L (ref 136–145)
TOTAL PROTEIN: 7.4 g/dL (ref 6.4–8.3)
Total Bilirubin: 0.24 mg/dL (ref 0.20–1.20)

## 2014-08-23 LAB — CBC WITH DIFFERENTIAL/PLATELET
BASO%: 0.5 % (ref 0.0–2.0)
Basophils Absolute: 0.1 10*3/uL (ref 0.0–0.1)
EOS%: 0.5 % (ref 0.0–7.0)
Eosinophils Absolute: 0.1 10*3/uL (ref 0.0–0.5)
HCT: 33.2 % — ABNORMAL LOW (ref 34.8–46.6)
HGB: 10.9 g/dL — ABNORMAL LOW (ref 11.6–15.9)
LYMPH#: 3 10*3/uL (ref 0.9–3.3)
LYMPH%: 26.1 % (ref 14.0–49.7)
MCH: 28.4 pg (ref 25.1–34.0)
MCHC: 32.8 g/dL (ref 31.5–36.0)
MCV: 86.6 fL (ref 79.5–101.0)
MONO#: 1 10*3/uL — AB (ref 0.1–0.9)
MONO%: 8.5 % (ref 0.0–14.0)
NEUT%: 64.4 % (ref 38.4–76.8)
NEUTROS ABS: 7.3 10*3/uL — AB (ref 1.5–6.5)
Platelets: 257 10*3/uL (ref 145–400)
RBC: 3.84 10*6/uL (ref 3.70–5.45)
RDW: 17.3 % — ABNORMAL HIGH (ref 11.2–14.5)
WBC: 11.3 10*3/uL — ABNORMAL HIGH (ref 3.9–10.3)

## 2014-08-23 MED ORDER — DIPHENHYDRAMINE HCL 50 MG/ML IJ SOLN
50.0000 mg | Freq: Once | INTRAMUSCULAR | Status: AC
  Administered 2014-08-23: 50 mg via INTRAVENOUS

## 2014-08-23 MED ORDER — CARBOPLATIN CHEMO INJECTION 600 MG/60ML
419.5000 mg | Freq: Once | INTRAVENOUS | Status: AC
  Administered 2014-08-23: 420 mg via INTRAVENOUS
  Filled 2014-08-23: qty 42

## 2014-08-23 MED ORDER — PACLITAXEL CHEMO INJECTION 300 MG/50ML
175.0000 mg/m2 | Freq: Once | INTRAVENOUS | Status: AC
  Administered 2014-08-23: 330 mg via INTRAVENOUS
  Filled 2014-08-23: qty 55

## 2014-08-23 MED ORDER — SODIUM CHLORIDE 0.9 % IV SOLN
Freq: Once | INTRAVENOUS | Status: AC
  Administered 2014-08-23: 13:00:00 via INTRAVENOUS

## 2014-08-23 MED ORDER — FAMOTIDINE IN NACL 20-0.9 MG/50ML-% IV SOLN
20.0000 mg | Freq: Once | INTRAVENOUS | Status: AC
  Administered 2014-08-23: 20 mg via INTRAVENOUS

## 2014-08-23 MED ORDER — DIPHENHYDRAMINE HCL 50 MG/ML IJ SOLN
INTRAMUSCULAR | Status: AC
  Filled 2014-08-23: qty 1

## 2014-08-23 MED ORDER — SODIUM CHLORIDE 0.9 % IV SOLN
Freq: Once | INTRAVENOUS | Status: AC
  Administered 2014-08-23: 14:00:00 via INTRAVENOUS
  Filled 2014-08-23: qty 8

## 2014-08-23 MED ORDER — FAMOTIDINE IN NACL 20-0.9 MG/50ML-% IV SOLN
INTRAVENOUS | Status: AC
  Filled 2014-08-23: qty 50

## 2014-08-23 NOTE — Patient Instructions (Signed)
[facility] Discharge Instructions for Patients Receiving Chemotherapy  Today you received the following chemotherapy agents Taxol and Carboplatin. To help prevent nausea and vomiting after your treatment, we encourage you to take your nausea medication as directed.  If you develop nausea and vomiting that is not controlled by your nausea medication, call the clinic.   BELOW ARE SYMPTOMS THAT SHOULD BE REPORTED IMMEDIATELY:  *FEVER GREATER THAN 100.5 F  *CHILLS WITH OR WITHOUT FEVER  NAUSEA AND VOMITING THAT IS NOT CONTROLLED WITH YOUR NAUSEA MEDICATION  *UNUSUAL SHORTNESS OF BREATH  *UNUSUAL BRUISING OR BLEEDING  TENDERNESS IN MOUTH AND THROAT WITH OR WITHOUT PRESENCE OF ULCERS  *URINARY PROBLEMS  *BOWEL PROBLEMS  UNUSUAL RASH Items with * indicate a potential emergency and should be followed up as soon as possible.  Feel free to call the clinic you have any questions or concerns. The clinic phone number is [phone].  Please show the CHEMO ALERT CARD at check-in to the Emergency Department and triage nurse.    

## 2014-08-23 NOTE — Telephone Encounter (Signed)
Pt confirmed labs/ov per 07/13 POF, gave pt AVS and Calendar.... KJ

## 2014-08-23 NOTE — Patient Instructions (Signed)
Your CT scan revealed stable disease You will complete your last cycle of adjuvant chemotherapy today as scheduled Follow up in 3 months with a restaging CT scan of your chest to re-evaluate your disease

## 2014-08-23 NOTE — Progress Notes (Addendum)
[facility] Telephone:(336) [phone]   Fax:(336) [phone]  OFFICE PROGRESS NOTE  MATTHEWS,MICHELLE A., MD [place] [address]  DIAGNOSIS: Lung cancer  Staging form: Lung, AJCC 7th Edition  Clinical: Stage IIA (T2b, N0, M0) - Signed by [name], MD on 05/31/2014  PRIOR THERAPY: Status post left upper lobectomy with lymph node dissection under the care of [name] on 05/04/2014  CURRENT THERAPY: Adjuvant chemotherapy with carboplatin for an AUC of 5 and paclitaxel 175 mg/m given every 3 weeks with Neulasta support. A total of 4 cycles of adjuvant chemotherapy are planned. Status post 3 cycles  INTERVAL HISTORY: [name] [name] 81 y.o. female returns to the clinic today for follow-up visit accompanied by her son and daughter. The patient is tolerating her treatment of systemic chemotherapy with carboplatin and paclitaxel fairly well except for the aching pain and fatigue after the Neulasta injection. The pain after the Neulasta injection was much better managed by taking Claritin and her pain medication. She reports decreased appetite but is otherwise tolerating her chemotherapy.  She denied having any significant fever or chills, no nausea or vomiting. The patient denied having any significant chest pain, shortness of breath, cough or hemoptysis. She is here today to start cycle #4 of her systemic chemotherapy. She recently had a restaging CT scan of the chest and presents to discuss the results.  MEDICAL HISTORY: Past Medical History  Diagnosis Date  . Palpitations   . SOB (shortness of breath)   . COPD (chronic obstructive pulmonary disease)   . HTN (hypertension)   . Diabetes mellitus without complication   . Anxiety   . Arthritis   . Cancer     ALLERGIES:  is allergic to darvon and lipitor.  MEDICATIONS:  Current Outpatient Prescriptions  Medication Sig Dispense Refill  . ALPRAZolam (XANAX) 0.5 MG tablet Take 0.5 mg by  mouth 3 (three) times daily as needed for anxiety.    Marland Kitchen aspirin 81 MG tablet Take 81 mg by mouth daily.    . budesonide-formoterol (SYMBICORT) 80-4.5 MCG/ACT inhaler Inhale 2 puffs into the lungs 2 (two) times daily as needed (Shortness of Breath).    . carboxymethylcellulose (REFRESH PLUS) 0.5 % SOLN Place 1 drop into both eyes daily as needed.    Marland Kitchen co-enzyme Q-10 50 MG capsule Take 50 mg by mouth daily.    . CRESTOR 20 MG tablet Take 20 mg by mouth daily.     . Fluticasone-Salmeterol (ADVAIR) 250-50 MCG/DOSE AEPB Inhale 1 puff into the lungs 2 (two) times daily as needed (Shortness of Breath).    . hydrOXYzine (ATARAX/VISTARIL) 10 MG tablet Take 1 tablet by mouth every 8 hours as needed for itching 30 tablet 0  . loteprednol (LOTEMAX) 0.2 % SUSP Apply 1 drop to eye 2 (two) times daily. For 14 days    . metFORMIN (GLUCOPHAGE) 500 MG tablet Take 500 mg by mouth 2 (two) times daily with a meal.    . metoprolol tartrate (LOPRESSOR) 25 MG tablet Take 25 mg by mouth 2 (two) times daily.    . naproxen sodium (ANAPROX) 220 MG tablet Take 220 mg by mouth 2 (two) times daily as needed (pain).    . Propylene Glycol (SYSTANE BALANCE OP) Apply 1 drop to eye daily as needed (dry eyes).    . valsartan-hydrochlorothiazide (DIOVAN-HCT) 160-25 MG per tablet Take 1 tablet by mouth every morning.     Marland Kitchen buPROPion [facility] SR) 150  MG 12 hr tablet Take 150 mg by mouth every 12 (twelve) hours.     No current facility-administered medications for this visit.   Facility-Administered Medications Ordered in Other Visits  Medication Dose Route Frequency Provider Last Rate Last Dose  . CARBOplatin (PARAPLATIN) 420 mg in sodium chloride 0.9 % 250 mL chemo infusion  420 mg Intravenous Once [name], MD      . PACLitaxel (TAXOL) 330 mg in dextrose 5 % 500 mL chemo infusion (> 48m/m2)  175 mg/m2 (Treatment Plan Actual) Intravenous Once MCurt Bears MD 185 mL/hr at 08/23/14 1346 330 mg at 08/23/14 1346     SURGICAL HISTORY:  Past Surgical History  Procedure Laterality Date  . Vaginal hysterectomy  1966  . Lung lobectomy  05/04/14    left upper lobectomy  . Video assisted thoracoscopy (vats)/ lobectomy Left 05/04/2014    Procedure: LEFT VIDEO ASSISTED THORACOSCOPY (VATS)/LEFT UPPER LOBECTOMY;  Surgeon: SMelrose Nakayama MD;  [place]  Service: Thoracic;  Laterality: Left;  . Lead removal Left 05/04/2014    Procedure: CRYO INTERCOSTAL NERVE BLOCK;  Surgeon: SMelrose Nakayama MD;  [place]  Service: Thoracic;  Laterality: Left;  . Node dissection Left 05/04/2014    Procedure: NODE DISSECTION;  Surgeon: SMelrose Nakayama MD;  [place]  Service: Thoracic;  Laterality: Left;    REVIEW OF SYSTEMS:  A comprehensive review of systems was negative except for: Constitutional: positive for anorexia and fatigue   PHYSICAL EXAMINATION: General appearance: alert, cooperative, fatigued and no distress Head: Normocephalic, without obvious abnormality, atraumatic Neck: no adenopathy, no JVD, supple, symmetrical, trachea midline and thyroid not enlarged, symmetric, no tenderness/mass/nodules Lymph nodes: Cervical, supraclavicular, and axillary nodes normal. Resp: clear to auscultation bilaterally Back: symmetric, no curvature. ROM normal. No CVA tenderness. Cardio: regular rate and rhythm, S1, S2 normal, no murmur, click, rub or gallop GI: soft, non-tender; bowel sounds normal; no masses,  no organomegaly Extremities: extremities normal, atraumatic, no cyanosis or edema  ECOG PERFORMANCE STATUS: 1 - Symptomatic but completely ambulatory  Blood pressure 154/75, pulse 75, temperature 98 F (36.7 C), temperature source Oral, resp. rate 19, height 5' 2.5" (1.588 m), weight 180 lb 12.8 oz (82.01 kg), SpO2 97 %.  LABORATORY DATA: Lab Results  Component Value Date   WBC 11.3* 08/23/2014   HGB 10.9* 08/23/2014   HCT 33.2* 08/23/2014   MCV 86.6 08/23/2014   PLT 257  08/23/2014      Chemistry      Component Value Date/Time   NA 141 08/23/2014 1034   NA 141 07/25/2014 1318   K 4.2 08/23/2014 1034   K 3.9 07/25/2014 1318   CL 110 07/25/2014 1318   CO2 25 08/23/2014 1034   CO2 20* 07/25/2014 1318   BUN 16.5 08/23/2014 1034   BUN 16 07/25/2014 1318   CREATININE 0.8 08/23/2014 1034   CREATININE 0.77 07/25/2014 1318   CREATININE 1.00 04/11/2014 1120      Component Value Date/Time   CALCIUM 9.8 08/23/2014 1034   CALCIUM 9.5 07/25/2014 1318   ALKPHOS 112 08/23/2014 1034   ALKPHOS 69 05/06/2014 0420   AST 15 08/23/2014 1034   AST 17 05/06/2014 0420   ALT 15 08/23/2014 1034   ALT 16 05/06/2014 0420   BILITOT 0.24 08/23/2014 1034   BILITOT 0.4 05/06/2014 0420       RADIOGRAPHIC STUDIES: Dg Chest 2 View  07/25/2014   CLINICAL DATA:  81year old female status post lobectomy and therapy  for lung cancer. Shortness of Breath. Subsequent encounter.  EXAM: CHEST  2 VIEW  COMPARISON:  05/23/2014 and earlier.  FINDINGS: Volume reduction in the left hemi thorax again noted. Stable cardiac size and mediastinal contours. The previously seen anterior left hydropneumothorax is no longer visible, but anterior and basilar left pleural effusion likely is still present. No pulmonary edema or acute pulmonary opacity. Stable visualized osseous structures.  IMPRESSION: Interval resolved component of left pleural gas. Residual left pleural effusion suspected but may be expected postoperatively in this post lobectomy patient.  No new cardiopulmonary abnormality.   Electronically Signed   By: Genevie Ann M.D.   On: 07/25/2014 11:14   Ct Chest W Contrast  08/21/2014   CLINICAL DATA:  Subsequent encounter for left-sided lung cancer.  EXAM: CT CHEST WITH CONTRAST  TECHNIQUE: Multidetector CT imaging of the chest was performed during intravenous contrast administration.  CONTRAST:  26m OMNIPAQUE IOHEXOL 300 MG/ML  SOLN  COMPARISON:  PET-CT from 03/29/2014.  FINDINGS: Mediastinum /  Lymph Nodes: There is no axillary lymphadenopathy. No mediastinal or hilar lymphadenopathy. Postsurgical change in the left hilum is compatible with previous left upper lobectomy. Scattered small lymph nodes in the anterior mediastinum or stable.  Heart size is normal. No pericardial effusion. Tiny hiatal hernia noted.  Lungs / Pleura: Volume loss in the left hemi thorax is compatible with left upper lobectomy. Advanced changes of emphysema noted bilaterally with areas of scarring and architectural distortion. There is some pleural thickening anteriorly in the upper left hemi thorax, presumed to be postsurgical, but close attention on follow-up recommended. 5 mm nodular density in the peripheral left lung (image 15 series 5) may be related to scarring, but attention on followup suggested.  7 mm posterior right apical subpleural nodule is unchanged.  MSK / Soft Tissues: Bone windows reveal no worrisome lytic or sclerotic osseous lesions.  Upper Abdomen: 13 mm right adrenal nodule is unchanged and was not hypermetabolic on the PET-CT. The tiny bilateral renal cysts are seen within the visualized portion of the upper kidneys. Duodenum diverticulum is evident.  IMPRESSION: 1. Status post left upper lobectomy. There is some focal pleural thickening/loculated pleural fluid anteriorly in the left hemi thorax, likely postsurgical, but attention to this area on followup is recommended. 2. New 5 mm nodular density in the peripheral left lung may be related to scarring. Attention on follow-up recommended. 3. Stable 7 mm subpleural posterior right apical nodule. 4. Advanced changes of emphysema bilaterally. 5. Stable 13 mm right adrenal nodule, not hypermetabolic on the previous PET-CT.   Electronically Signed   By: EMisty StanleyM.D.   On: 08/21/2014 13:57    ASSESSMENT AND PLAN: This is a very pleasant 81years old African-American female with a stage IIA non-small cell lung cancer, status post left upper lobectomy with  lymph node dissection and currently undergoing adjuvant chemotherapy with carboplatin and paclitaxel status post 3 cycles.  The patient is tolerating her treatment fairly well except for the aching pain after the Neulasta injection. She will continue to take Claritin 1 tablet by mouth daily for 5 days after the Neulasta injection in addition to her pain medication. The patient was discussed with and also seen by Dr. MJulien Nordmann Her recent restaging CT scan revealed overall stable disease. There was a new 5 mm nodular density in the peripheral left lung which may be related to scarring. Was recommended that the tension the pain on follow-up. She'll proceed with cycle 4 of her adjuvant chemotherapy.  We will plan to see her in 3 months with another restaging CT scan of the chest with contrast to reevaluate her disease as well as a repeat CBC differential and C met. She was advised to call immediately if she has any concerning symptoms in the interval. The patient voices understanding of current disease status and treatment options and is in agreement with the current care plan.  All questions were answered. The patient knows to call the clinic with any problems, questions or concerns. We can certainly see the patient much sooner if necessary.  Carlton Adam, PA-C 08/23/2014  ADDENDUM: Hematology/Oncology Attending: I had a face to face encounter with the patient today. I recommended her care plan. This is a very pleasant 81 years old African-American female with a stage II a non-small cell lung cancer status post left upper lobectomy with lymph node dissection and currently undergoing adjuvant systemic chemotherapy was carboplatin and paclitaxel is status post 3 cycles. The patient is tolerating her treatment fairly well with no significant adverse effect except for mild fatigue. The recent CT scan of the chest showed stable disease with no evidence for disease recurrence. I discussed the scan results  with the patient and her family. I recommended for her to proceed with cycle #4 today as a scheduled. The patient would come back for follow-up visit in 3 months for reevaluation and repeat CT scan of the chest. She was advised to call immediately if she has any concerning symptoms in the interval.  Disclaimer: This note was dictated with voice recognition software. Similar sounding words can inadvertently be transcribed and may be missed upon review. Eilleen Kempf., MD 08/27/2014

## 2014-08-25 ENCOUNTER — Ambulatory Visit (HOSPITAL_BASED_OUTPATIENT_CLINIC_OR_DEPARTMENT_OTHER): Payer: Medicare HMO

## 2014-08-25 VITALS — BP 168/86 | HR 70 | Temp 98.3°F

## 2014-08-25 DIAGNOSIS — C3412 Malignant neoplasm of upper lobe, left bronchus or lung: Secondary | ICD-10-CM

## 2014-08-25 DIAGNOSIS — Z5189 Encounter for other specified aftercare: Secondary | ICD-10-CM

## 2014-08-25 MED ORDER — PEGFILGRASTIM INJECTION 6 MG/0.6ML ~~LOC~~
6.0000 mg | PREFILLED_SYRINGE | Freq: Once | SUBCUTANEOUS | Status: AC
  Administered 2014-08-25: 6 mg via SUBCUTANEOUS
  Filled 2014-08-25: qty 0.6

## 2014-08-31 ENCOUNTER — Ambulatory Visit: Payer: Self-pay | Admitting: Family Medicine

## 2014-09-18 ENCOUNTER — Encounter: Payer: Self-pay | Admitting: Family Medicine

## 2014-09-18 ENCOUNTER — Ambulatory Visit (INDEPENDENT_AMBULATORY_CARE_PROVIDER_SITE_OTHER): Payer: Medicare HMO | Admitting: Family Medicine

## 2014-09-18 VITALS — BP 146/61 | HR 79 | Temp 98.1°F | Resp 14 | Ht 62.0 in | Wt 179.0 lb

## 2014-09-18 DIAGNOSIS — Z7189 Other specified counseling: Secondary | ICD-10-CM

## 2014-09-18 DIAGNOSIS — K219 Gastro-esophageal reflux disease without esophagitis: Secondary | ICD-10-CM | POA: Diagnosis not present

## 2014-09-18 DIAGNOSIS — Z7689 Persons encountering health services in other specified circumstances: Secondary | ICD-10-CM

## 2014-09-18 MED ORDER — ESOMEPRAZOLE MAGNESIUM 40 MG PO CPDR: 40.0000 mg | DELAYED_RELEASE_CAPSULE | Freq: Every day | ORAL | Status: DC

## 2014-09-18 NOTE — Progress Notes (Signed)
Patient ID: [name] [name], female   DOB: 1933/07/23, 81 y.o.   MRN: [id number]   [name] [name], is a 81 y.o. female  NUU:[id number]  HKV:[id number]  DOB - [birth year]  CC:  Chief Complaint  Patient presents with  . Establish Care    wants to see if medications can be adjusted, acid refulx. Dizziness and pressure in head. weakness. coughing        HPI: [name] [name] is a 81 y.o. female here to establish care. He main complaint today is of GERD. She has a diagnosis of lung cancer and has just finished chemo. She has also had a left upper lobectomy. She has a history of COPD. She is currently not receiving treatment for this. She does report some shortness of breath.  One of her major concerns is that she has seen a cardiologist whom she reports did not find any heart condition but started her of a number of medications that she does not feel she needs. She was seen a Urgent Care at [facility] at some point earlier this year and was started on metformin. I find no records of a verified fasting blood sugar and no A1C. She reports the metformin causes her GI distress and she would like to stop if possible. She is also on Wellbutrin but says she has never been depressed. It is possible that she was started on that for smoking cessation, which she has accomplished.The other medications she is on are listed in her medication list. She has a history of hypertension and is on Lopressor 25 and Diovan/HCT 160/25. Allergies  Allergen Reactions  . Darvon [Propoxyphene Hcl]   . Lipitor [Atorvastatin]     myalgia's   Past Medical History  Diagnosis Date  . Palpitations   . SOB (shortness of breath)   . COPD (chronic obstructive pulmonary disease)   . HTN (hypertension)   . Diabetes mellitus without complication   . Anxiety   . Arthritis   . Cancer    Current Outpatient Prescriptions on File Prior to Visit  Medication Sig Dispense Refill  . ALPRAZolam (XANAX) 0.5 MG tablet Take 0.5 mg by  mouth 3 (three) times daily as needed for anxiety.    . budesonide-formoterol (SYMBICORT) 80-4.5 MCG/ACT inhaler Inhale 2 puffs into the lungs 2 (two) times daily as needed (Shortness of Breath).    Marland Kitchen buPROPion (WELLBUTRIN SR) 150 MG 12 hr tablet Take 150 mg by mouth every 12 (twelve) hours.    Marland Kitchen co-enzyme Q-10 50 MG capsule Take 50 mg by mouth daily.    . CRESTOR 20 MG tablet Take 20 mg by mouth daily.     . hydrOXYzine (ATARAX/VISTARIL) 10 MG tablet Take 1 tablet by mouth every 8 hours as needed for itching 30 tablet 0  . loteprednol (LOTEMAX) 0.2 % SUSP Apply 1 drop to eye 2 (two) times daily. For 14 days    . metFORMIN (GLUCOPHAGE) 500 MG tablet Take 500 mg by mouth 2 (two) times daily with a meal.    . metoprolol tartrate (LOPRESSOR) 25 MG tablet Take 25 mg by mouth 2 (two) times daily.    . naproxen sodium (ANAPROX) 220 MG tablet Take 220 mg by mouth 2 (two) times daily as needed (pain).    . valsartan-hydrochlorothiazide (DIOVAN-HCT) 160-25 MG per tablet Take 1 tablet by mouth every morning.     Marland Kitchen aspirin 81 MG tablet Take 81 mg by mouth daily.    . carboxymethylcellulose (REFRESH PLUS) 0.5 % SOLN Place  1 drop into both eyes daily as needed.    . Fluticasone-Salmeterol (ADVAIR) 250-50 MCG/DOSE AEPB Inhale 1 puff into the lungs 2 (two) times daily as needed (Shortness of Breath).    . Propylene Glycol (SYSTANE BALANCE OP) Apply 1 drop to eye daily as needed (dry eyes).     No current facility-administered medications on file prior to visit.   Family History  Problem Relation Age of Onset  . Cervical cancer Mother   . Prostate cancer Father   . Stroke Maternal Grandmother   . Angina Maternal Grandfather   . Prostate cancer Paternal Uncle    History   Social History  . Marital Status: Widowed    Spouse Name: N/A  . Number of Children: N/A  . Years of Education: N/A   Occupational History  . retired    Social History Main Topics  . Smoking status: Former Smoker -- 0.25  packs/day for 69 years    Types: Cigarettes    Quit date: 05/03/2014  . Smokeless tobacco: Not on file     Comment: Quit 05/03/14  . Alcohol Use: No  . Drug Use: No  . Sexual Activity: Not on file   Other Topics Concern  . Not on file   Social History Narrative    Review of Systems: Constitutional: Negative for fever, chills, appetite change, weight loss. Positive for fatigue and cold intolerance. HENT: Negative for ear pain, ear discharge.nose bleeds, some nasal discharge at times. She has some sorethroats that may be related to reflux Eyes: Negative for pain, discharge, redness, itching and visual disturbance. Neck: Negative for pain, stiffness Respiratory: Negative for cough. Some shortness of breath,   Cardiovascular: Negative for chest pain, palpitations and leg swelling. Gastrointestinal: Negative for abdominal distention, abdominal pain, nausea, vomiting, diarrhea, constipations. Positive for heartburn frequently. Genitourinary: Negative for dysuria, urgency, frequency, hematuria, flank pain. Positive for occassional incontenince. She also reports a slow stream Musculoskeletal: Negative for back pain, joint pain, joint  swelling, arthralgia and gait problem.Negative for weakness. Neurological: Negative for  tremors, seizures, syncope,   light-headedness, numbness and headaches. Positive for occassional dizziness Hematological: Negative for easy bleeding. Positive for easy bruising Psychiatric/Behavioral: Negative for depression, anxiety, decreased concentration, confusion    Objective:   Filed Vitals:   09/18/14 1503  BP: 146/61  Pulse: 79  Temp: 98.1 F (36.7 C)  Resp: 14    Physical Exam: Constitutional: Patient appears well-developed and well-nourished. No distress. HENT: Normocephalic, atraumatic, External right and left ear normal. Oropharynx is clear and moist.  Eyes: Conjunctivae and EOM are normal. PERRLA, no scleral icterus. Neck: Normal ROM. Neck supple.  No lymphadenopathy, No thyromegaly. CVS: RRR, S1/S2 +, no murmurs, no gallops, no rubs Pulmonary: Effort and breath sounds normal, no stridor, rhonchi, wheezes, rales. There are decreased BS on the left. Abdominal: Soft. Normoactive BS,, no distension, tenderness, rebound or guarding.  Musculoskeletal: Normal range of motion. No edema and no tenderness.  Neuro: Alert.Normal muscle tone coordination. Non-focal Skin: Skin is warm and dry. No rash noted. Not diaphoretic. No erythema. No pallor. Psychiatric: Normal mood and affect. Behavior, judgment, thought content normal.  Lab Results  Component Value Date   WBC 11.3* 08/23/2014   HGB 10.9* 08/23/2014   HCT 33.2* 08/23/2014   MCV 86.6 08/23/2014   PLT 257 08/23/2014   Lab Results  Component Value Date   CREATININE 0.8 08/23/2014   BUN 16.5 08/23/2014   NA 141 08/23/2014   K 4.2 08/23/2014   CL  110 07/25/2014   CO2 25 08/23/2014    No results found for: HGBA1C Lipid Panel  No results found for: CHOL, TRIG, HDL, CHOLHDL, VLDL, LDLCALC     Assessment and plan:   Establishment of care -I have review her histories as presented by patient and pertinent record from chart. I do not have records from Dr. Daiva Huge at [facility] to review. -Will request those records -She needs to return for review of health maintenance issues. Have requested she do so in one month  Hypertension -Continue current medications  GERD -Nexium 40 mg, #30 with 3 refills -return in one month for follow-up GERD  Elevated BS in past -She is going to stop metformin. -Her husband is diabetic and will check her BS at alternating times and bring those reading in at next visit in one month. -There are to call if fasting blood sugars are greater that 125 -Otherwise we will check A1C in 2 months.   Return in about 1 month (around 10/19/2014).  The patient was given clear instructions to go to ER or return to medical center if symptoms don't improve,  worsen or new problems develop. The patient verbalized understanding. The patient was told to call to get lab results if they haven't heard anything in the next week.       Micheline Chapman, MSN, FNP-BC   09/18/2014, 5:38 PM

## 2014-09-18 NOTE — Patient Instructions (Addendum)
Stop metformin for now. Continue other medications until I can get records from the cardiologist to determine what he was thinking. Start Nexium 40 mg, one a day for Reflux. May wean off Wellbutrin Come back in one month for a review of your medications and other health maintenance issues.

## 2014-09-26 ENCOUNTER — Ambulatory Visit: Payer: Self-pay | Admitting: Thoracic Surgery (Cardiothoracic Vascular Surgery)

## 2014-10-17 ENCOUNTER — Telehealth: Payer: Self-pay | Admitting: Internal Medicine

## 2014-10-17 NOTE — Telephone Encounter (Signed)
She is only a gold I copd meaning mild and the main goal is maintaining off cigs so no need for pulmonary eval unless directed by primary care who should always see her first to sort out what she needs

## 2014-10-17 NOTE — Telephone Encounter (Signed)
Spoke with patient-was seen once by MW on 03-22-14 for consult and then sent to Oncology; would like to know if she needs to return to [facility] for OV.

## 2014-10-18 NOTE — Telephone Encounter (Signed)
Spoke with the pt and notified of recs per MW  Pt verbalized understanding  Nothing further needed

## 2014-10-23 ENCOUNTER — Ambulatory Visit: Payer: Medicare HMO | Admitting: Family Medicine

## 2014-10-30 ENCOUNTER — Other Ambulatory Visit: Payer: Self-pay | Admitting: Thoracic Surgery (Cardiothoracic Vascular Surgery)

## 2014-10-30 DIAGNOSIS — C3490 Malignant neoplasm of unspecified part of unspecified bronchus or lung: Secondary | ICD-10-CM

## 2014-10-31 ENCOUNTER — Ambulatory Visit (INDEPENDENT_AMBULATORY_CARE_PROVIDER_SITE_OTHER): Payer: Medicare HMO | Admitting: Thoracic Surgery (Cardiothoracic Vascular Surgery)

## 2014-10-31 ENCOUNTER — Encounter: Payer: Self-pay | Admitting: Thoracic Surgery (Cardiothoracic Vascular Surgery)

## 2014-10-31 ENCOUNTER — Ambulatory Visit
Admission: RE | Admit: 2014-10-31 | Discharge: 2014-10-31 | Disposition: A | Discharge status: Home / Self Care | Payer: Medicare HMO | Source: Ambulatory Visit | Attending: Thoracic Surgery (Cardiothoracic Vascular Surgery) | Admitting: Thoracic Surgery (Cardiothoracic Vascular Surgery)

## 2014-10-31 VITALS — BP 139/83 | HR 80 | Resp 20 | Ht 62.0 in | Wt 179.0 lb

## 2014-10-31 DIAGNOSIS — Z902 Acquired absence of lung [part of]: Secondary | ICD-10-CM

## 2014-10-31 DIAGNOSIS — C3492 Malignant neoplasm of unspecified part of left bronchus or lung: Secondary | ICD-10-CM | POA: Diagnosis not present

## 2014-10-31 DIAGNOSIS — Z9889 Other specified postprocedural states: Secondary | ICD-10-CM

## 2014-10-31 DIAGNOSIS — C3490 Malignant neoplasm of unspecified part of unspecified bronchus or lung: Secondary | ICD-10-CM

## 2014-10-31 NOTE — Progress Notes (Signed)
[address]       [place] [address]             [phone]       HPI:  Ms. Losey returns today for scheduled 6 month follow-up visit.  She is a 81 year old former smoker who had a thoracoscopic left upper lobectomy in March of this year for a stage II a non-small cell carcinoma. She received postoperative adjuvant chemotherapy with carboplatin and paclitaxel.  She says over the past couple of weeks she's had a persistent cough. Sternal nonproductive. She's not had any fevers or chills. She does get some left-sided chest pain when she coughs a lot. Otherwise she's not having any significant pain related to her surgery. She lost a significant amount of weight with surgery and chemotherapy. She also knows that her skin is very dry. Past Medical History  Diagnosis Date  . Palpitations   . SOB (shortness of breath)   . COPD (chronic obstructive pulmonary disease)   . HTN (hypertension)   . Diabetes mellitus without complication   . Anxiety   . Arthritis   . Cancer       Current Outpatient Prescriptions  Medication Sig Dispense Refill  . ALPRAZolam (XANAX) 0.5 MG tablet Take 0.5 mg by mouth 3 (three) times daily as needed for anxiety.    Marland Kitchen aspirin 81 MG tablet Take 81 mg by mouth daily.    Marland Kitchen co-enzyme Q-10 50 MG capsule Take 50 mg by mouth daily.    . CRESTOR 20 MG tablet Take 20 mg by mouth daily.     Marland Kitchen esomeprazole (NEXIUM) 40 MG capsule Take 1 capsule (40 mg total) by mouth daily. 30 capsule 3  . metoprolol tartrate (LOPRESSOR) 25 MG tablet Take 25 mg by mouth 2 (two) times daily.    . multivitamin-iron-minerals-folic acid (CENTRUM) chewable tablet Chew 1 tablet by mouth daily.    . valsartan-hydrochlorothiazide (DIOVAN-HCT) 160-25 MG per tablet Take 1 tablet by mouth every morning.      No current facility-administered medications for this visit.    Physical Exam  Diagnostic Tests: CT CHEST WITH CONTRAST July 2016  TECHNIQUE: Multidetector  CT imaging of the chest was performed during intravenous contrast administration.  CONTRAST: 27m OMNIPAQUE IOHEXOL 300 MG/ML SOLN  COMPARISON: PET-CT from 03/29/2014.  FINDINGS: Mediastinum / Lymph Nodes: There is no axillary lymphadenopathy. No mediastinal or hilar lymphadenopathy. Postsurgical change in the left hilum is compatible with previous left upper lobectomy. Scattered small lymph nodes in the anterior mediastinum or stable.  Heart size is normal. No pericardial effusion. Tiny hiatal hernia noted.  Lungs / Pleura: Volume loss in the left hemi thorax is compatible with left upper lobectomy. Advanced changes of emphysema noted bilaterally with areas of scarring and architectural distortion. There is some pleural thickening anteriorly in the upper left hemi thorax, presumed to be postsurgical, but close attention on follow-up recommended. 5 mm nodular density in the peripheral left lung (image 15 series 5) may be related to scarring, but attention on followup suggested.  7 mm posterior right apical subpleural nodule is unchanged.  MSK / Soft Tissues: Bone windows reveal no worrisome lytic or sclerotic osseous lesions.  Upper Abdomen: 13 mm right adrenal nodule is unchanged and was not hypermetabolic on the PET-CT. The tiny bilateral renal cysts are seen within the visualized portion of the upper kidneys. Duodenum diverticulum is evident.  IMPRESSION: 1. Status post left upper lobectomy. There is some focal pleural  thickening/loculated pleural fluid anteriorly in the left hemi thorax, likely postsurgical, but attention to this area on followup is recommended. 2. New 5 mm nodular density in the peripheral left lung may be related to scarring. Attention on follow-up recommended. 3. Stable 7 mm subpleural posterior right apical nodule. 4. Advanced changes of emphysema bilaterally. 5. Stable 13 mm right adrenal nodule, not hypermetabolic on the previous  PET-CT.   Electronically Signed  By: Misty Stanley M.D.  On: 08/21/2014 13:57  CHEST 2 VIEW 10/31/14  COMPARISON: 07/25/2014 and chest CT dated 08/21/2014.  FINDINGS: Stable tenting of the left hemidiaphragm. Normal sized heart. Stable prominence of the interstitial markings. Mildly progressive left upper, lateral pleural thickening with interval small amount of patchy density in the adjacent underlying lung in a somewhat linear fashion. Mild thoracic spine degenerative changes.  IMPRESSION: 1. Mildly progressive left upper pleural thickening with interval small amount of patchy underlying lung density in a linear fashion, paralleling the pleura. This is most likely reactive change rather than active infection. 2. Stable chronic interstitial lung disease.   Electronically Signed  By: Claudie Revering M.D.  On: 10/31/2014 10:53  I personally reviewed the CT form July and today's chest x-ray and concur with the findings as noted above.  Impression: 81 year old woman who is now 6 months out from a thoracoscopic left upper lobectomy for a stage II a non-small cell carcinoma. She is doing well at this point in time.  She says she has not smoked since the day prior to her surgery. She still has cravings although they are getting less severe over time.Smoking cessation instruction/counseling given:  commended patient for quitting and reviewed strategies for preventing relapses   She has a follow-up appointment with Dr. Julien Nordmann in October and will have a CT scan at that time.  Since she will be followed closely by Dr. Julien Nordmann, I will not make her scheduled additional follow-ups with me as that would be redundant. I will, of course, be happy to see her back at any time if I can be of any assistance with her care.  Plan: Follow-up is scheduled with Dr. Julien Nordmann.  Return when necessary.  [name], MD [facility] [phone]

## 2014-11-01 ENCOUNTER — Telehealth: Payer: Self-pay | Admitting: Medical Oncology

## 2014-11-01 NOTE — Telephone Encounter (Signed)
Pt started taking nexium for acid reflux and it is not working . Her throat is burning . I instructed her to contact the provider who she saw for problem initially or urgent care.

## 2014-11-07 ENCOUNTER — Encounter: Payer: Self-pay | Admitting: *Deleted

## 2014-11-08 ENCOUNTER — Telehealth: Payer: Self-pay | Admitting: Internal Medicine

## 2014-11-08 NOTE — Telephone Encounter (Signed)
s.w. pt and advised on OCT appts....pt ok and aware °

## 2014-11-14 ENCOUNTER — Institutional Professional Consult (permissible substitution): Payer: Medicare HMO | Admitting: Internal Medicine

## 2014-11-17 ENCOUNTER — Ambulatory Visit: Payer: Medicare HMO | Admitting: Internal Medicine

## 2014-11-20 ENCOUNTER — Ambulatory Visit (HOSPITAL_COMMUNITY)
Admission: RE | Admit: 2014-11-20 | Discharge: 2014-11-20 | Disposition: A | Discharge status: Home / Self Care | Payer: Medicare HMO | Source: Ambulatory Visit | Attending: Physician Assistant | Admitting: Physician Assistant

## 2014-11-20 ENCOUNTER — Other Ambulatory Visit (HOSPITAL_BASED_OUTPATIENT_CLINIC_OR_DEPARTMENT_OTHER): Payer: Medicare HMO

## 2014-11-20 DIAGNOSIS — R911 Solitary pulmonary nodule: Secondary | ICD-10-CM | POA: Diagnosis not present

## 2014-11-20 DIAGNOSIS — J479 Bronchiectasis, uncomplicated: Secondary | ICD-10-CM | POA: Diagnosis not present

## 2014-11-20 DIAGNOSIS — I2510 Atherosclerotic heart disease of native coronary artery without angina pectoris: Secondary | ICD-10-CM | POA: Insufficient documentation

## 2014-11-20 DIAGNOSIS — C3412 Malignant neoplasm of upper lobe, left bronchus or lung: Secondary | ICD-10-CM | POA: Diagnosis not present

## 2014-11-20 DIAGNOSIS — Z902 Acquired absence of lung [part of]: Secondary | ICD-10-CM | POA: Insufficient documentation

## 2014-11-20 DIAGNOSIS — J439 Emphysema, unspecified: Secondary | ICD-10-CM | POA: Insufficient documentation

## 2014-11-20 LAB — COMPREHENSIVE METABOLIC PANEL (CC13)
ALBUMIN: 3.5 g/dL (ref 3.5–5.0)
ALT: 19 U/L (ref 0–55)
ANION GAP: 8 meq/L (ref 3–11)
AST: 22 U/L (ref 5–34)
Alkaline Phosphatase: 103 U/L (ref 40–150)
BUN: 19.9 mg/dL (ref 7.0–26.0)
CALCIUM: 9.9 mg/dL (ref 8.4–10.4)
CHLORIDE: 109 meq/L (ref 98–109)
CO2: 24 mEq/L (ref 22–29)
CREATININE: 1 mg/dL (ref 0.6–1.1)
EGFR: 66 mL/min/{1.73_m2} — ABNORMAL LOW (ref >90)
Glucose: 152 mg/dl — ABNORMAL HIGH (ref 70–140)
Potassium: 4.6 mEq/L (ref 3.5–5.1)
Sodium: 141 mEq/L (ref 136–145)
TOTAL PROTEIN: 7.5 g/dL (ref 6.4–8.3)

## 2014-11-20 LAB — CBC WITH DIFFERENTIAL/PLATELET
BASO%: 1 % (ref 0.0–2.0)
Basophils Absolute: 0.1 10*3/uL (ref 0.0–0.1)
EOS%: 2.4 % (ref 0.0–7.0)
Eosinophils Absolute: 0.2 10*3/uL (ref 0.0–0.5)
HEMATOCRIT: 34.4 % — AB (ref 34.8–46.6)
HEMOGLOBIN: 11.4 g/dL — AB (ref 11.6–15.9)
LYMPH#: 2.8 10*3/uL (ref 0.9–3.3)
LYMPH%: 30.2 % (ref 14.0–49.7)
MCH: 28.7 pg (ref 25.1–34.0)
MCHC: 33 g/dL (ref 31.5–36.0)
MCV: 87 fL (ref 79.5–101.0)
MONO#: 0.7 10*3/uL (ref 0.1–0.9)
MONO%: 7.7 % (ref 0.0–14.0)
NEUT%: 58.7 % (ref 38.4–76.8)
NEUTROS ABS: 5.4 10*3/uL (ref 1.5–6.5)
PLATELETS: 291 10*3/uL (ref 145–400)
RBC: 3.95 10*6/uL (ref 3.70–5.45)
RDW: 13.2 % (ref 11.2–14.5)
WBC: 9.1 10*3/uL (ref 3.9–10.3)

## 2014-11-20 MED ORDER — IOHEXOL 300 MG/ML  SOLN
75.0000 mL | Freq: Once | INTRAMUSCULAR | Status: AC | PRN
  Administered 2014-11-20: 75 mL via INTRAVENOUS

## 2014-11-22 ENCOUNTER — Telehealth: Payer: Self-pay | Admitting: Internal Medicine

## 2014-11-22 ENCOUNTER — Other Ambulatory Visit: Payer: Medicare HMO

## 2014-11-22 ENCOUNTER — Encounter: Payer: Self-pay | Admitting: Internal Medicine

## 2014-11-22 ENCOUNTER — Ambulatory Visit (HOSPITAL_BASED_OUTPATIENT_CLINIC_OR_DEPARTMENT_OTHER): Payer: Medicare HMO | Admitting: Internal Medicine

## 2014-11-22 VITALS — BP 117/52 | HR 18 | Temp 97.9°F | Resp 18 | Ht 62.0 in | Wt 183.9 lb

## 2014-11-22 DIAGNOSIS — C3412 Malignant neoplasm of upper lobe, left bronchus or lung: Secondary | ICD-10-CM

## 2014-11-22 NOTE — Progress Notes (Signed)
[place] Telephone:([phone]   Fax:([phone]  OFFICE PROGRESS NOTE  [name], [place] [address]  DIAGNOSIS: Lung cancer  Staging form: Lung, AJCC 7th Edition  Clinical: Stage IIA (T2b, N0, M0) - Signed by [name], MD on 05/31/2014  PRIOR THERAPY:  1) Status post left upper lobectomy with lymph node dissection under the care of [name] on 05/04/2014. 2) Adjuvant chemotherapy with carboplatin for an AUC of 5 and paclitaxel 175 mg/m given every 3 weeks with Neulasta support. A total of 4 cycles of adjuvant chemotherapy are planned. Status post 4 cycles. Last dose was given 08/26/2014.  CURRENT THERAPY: Observation.  INTERVAL HISTORY: Aubrey Blackard 81 y.o. female returns to the clinic today for follow-up visit accompanied by her son and daughter. The patient tolerated her systemic chemotherapy with carboplatin and paclitaxel fairly well. She has been observation for the last 3 months. She denied having any significant fever or chills, no nausea or vomiting but she has some acid reflux and she is currently on Nexium once daily. The patient denied having any significant chest pain, shortness of breath, cough or hemoptysis. She had repeat CT scan of the chest performed recently and she is here for evaluation and discussion of her scan results.  MEDICAL HISTORY: Past Medical History  Diagnosis Date  . Palpitations   . SOB (shortness of breath)   . COPD (chronic obstructive pulmonary disease)   . HTN (hypertension)   . Diabetes mellitus without complication   . Anxiety   . Arthritis   . Cancer     ALLERGIES:  is allergic to darvon and lipitor.  MEDICATIONS:  Current Outpatient Prescriptions  Medication Sig Dispense Refill  . ALPRAZolam (XANAX) 0.5 MG tablet Take 0.5 mg by mouth 3 (three) times daily as needed for anxiety.    Marland Kitchen aspirin 81 MG tablet Take 81 mg by mouth daily.    Marland Kitchen co-enzyme Q-10 50  MG capsule Take 50 mg by mouth daily.    . CRESTOR 20 MG tablet Take 20 mg by mouth daily.     Marland Kitchen esomeprazole (NEXIUM) 40 MG capsule Take 1 capsule (40 mg total) by mouth daily. 30 capsule 3  . metoprolol tartrate (LOPRESSOR) 25 MG tablet Take 25 mg by mouth 2 (two) times daily.    . multivitamin-iron-minerals-folic acid (CENTRUM) chewable tablet Chew 1 tablet by mouth daily.    . valsartan-hydrochlorothiazide (DIOVAN-HCT) 160-25 MG per tablet Take 1 tablet by mouth every morning.     . VENTOLIN HFA 108 (90 BASE) MCG/ACT inhaler      No current facility-administered medications for this visit.    SURGICAL HISTORY:  Past Surgical History  Procedure Laterality Date  . Vaginal hysterectomy  1966  . Lung lobectomy  05/04/14    left upper lobectomy  . Video assisted thoracoscopy (vats)/ lobectomy Left 05/04/2014    Procedure: LEFT VIDEO ASSISTED THORACOSCOPY (VATS)/LEFT UPPER LOBECTOMY;  Surgeon: [name], MD;  Location: [place];  Service: Thoracic;  Laterality: Left;  . Lead removal Left 05/04/2014    Procedure: CRYO INTERCOSTAL NERVE BLOCK;  Surgeon: [name], MD;  Location: [place];  Service: Thoracic;  Laterality: Left;  . Node dissection Left 05/04/2014    Procedure: NODE DISSECTION;  Surgeon: [name], MD;  Location: [facility] OR;  Service: Thoracic;  Laterality: Left;    REVIEW OF SYSTEMS:  A comprehensive review of systems was negative except for:  Ears, nose, mouth, throat, and face: positive for nasal congestion Gastrointestinal: positive for reflux symptoms   PHYSICAL EXAMINATION: General appearance: alert, cooperative, fatigued and no distress Head: Normocephalic, without obvious abnormality, atraumatic Neck: no adenopathy, no JVD, supple, symmetrical, trachea midline and thyroid not enlarged, symmetric, no tenderness/mass/nodules Lymph nodes: Cervical, supraclavicular, and axillary nodes normal. Resp: clear to auscultation bilaterally Back: symmetric, no  curvature. ROM normal. No CVA tenderness. Cardio: regular rate and rhythm, S1, S2 normal, no murmur, click, rub or gallop GI: soft, non-tender; bowel sounds normal; no masses,  no organomegaly Extremities: extremities normal, atraumatic, no cyanosis or edema  ECOG PERFORMANCE STATUS: 1 - Symptomatic but completely ambulatory  Blood pressure 117/52, pulse 18, temperature 97.9 F (36.6 C), temperature source Oral, resp. rate 18, height '5\' 2"'$  (1.575 m), weight 183 lb 14.4 oz (83.416 kg), SpO2 93 %.  LABORATORY DATA: Lab Results  Component Value Date   WBC 9.1 11/20/2014   HGB 11.4* 11/20/2014   HCT 34.4* 11/20/2014   MCV 87.0 11/20/2014   PLT 291 11/20/2014      Chemistry      Component Value Date/Time   NA 141 11/20/2014 1106   NA 141 07/25/2014 1318   K 4.6 11/20/2014 1106   K 3.9 07/25/2014 1318   CL 110 07/25/2014 1318   CO2 24 11/20/2014 1106   CO2 20* 07/25/2014 1318   BUN 19.9 11/20/2014 1106   BUN 16 07/25/2014 1318   CREATININE 1.0 11/20/2014 1106   CREATININE 0.77 07/25/2014 1318   CREATININE 1.00 04/11/2014 1120      Component Value Date/Time   CALCIUM 9.9 11/20/2014 1106   CALCIUM 9.5 07/25/2014 1318   ALKPHOS 103 11/20/2014 1106   ALKPHOS 69 05/06/2014 0420   AST 22 11/20/2014 1106   AST 17 05/06/2014 0420   ALT 19 11/20/2014 1106   ALT 16 05/06/2014 0420   BILITOT <0.30 11/20/2014 1106   BILITOT 0.4 05/06/2014 0420       RADIOGRAPHIC STUDIES: Dg Chest 2 View  10/31/2014  CLINICAL DATA:  Status post left VATS on 05/04/2014 for left lung cancer. Chemotherapy after surgery. Cough today with chest congestion. EXAM: CHEST  2 VIEW COMPARISON:  07/25/2014 and chest CT dated 08/21/2014. FINDINGS: Stable tenting of the left hemidiaphragm. Normal sized heart. Stable prominence of the interstitial markings. Mildly progressive left upper, lateral pleural thickening with interval small amount of patchy density in the adjacent underlying lung in a somewhat linear  fashion. Mild thoracic spine degenerative changes. IMPRESSION: 1. Mildly progressive left upper pleural thickening with interval small amount of patchy underlying lung density in a linear fashion, paralleling the pleura. This is most likely reactive change rather than active infection. 2. Stable chronic interstitial lung disease. Electronically Signed   By: Claudie Revering M.D.   On: 10/31/2014 10:53   Ct Chest W Contrast  11/20/2014  CLINICAL DATA:  81 year old female with history of lung cancer status post left upper lobectomy. Patient recently completed chemotherapy. Followup study. EXAM: CT CHEST WITH CONTRAST TECHNIQUE: Multidetector CT imaging of the chest was performed during intravenous contrast administration. CONTRAST:  34m OMNIPAQUE IOHEXOL 300 MG/ML  SOLN COMPARISON:  Chest CT 08/21/2014.  PET-CT 03/29/2014. FINDINGS: Mediastinum/Lymph Nodes: Heart size is normal. There is no significant pericardial fluid, thickening or pericardial calcification. There is atherosclerosis of the thoracic aorta, the great vessels of the mediastinum and the coronary arteries, including calcified atherosclerotic plaque in the left anterior descending and right coronary arteries. No pathologically enlarged mediastinal  or hilar lymph nodes. Small hiatal hernia. No axillary lymphadenopathy. Lungs/Pleura: Status post left upper lobectomy. Compensatory hyperexpansion of the left lower lobe. 5 mm left lower lobe nodule (image 15 of series 5) is unchanged. 7 mm partially calcified pleural-based nodule in the posterior right upper lobe is unchanged, likely a calcified granuloma. No other larger more suspicious appearing pulmonary nodules or masses are noted. Previously noted small anteriorly loculated pleural fluid collection in the left hemithorax has resolved. Mild diffuse bronchial wall thickening with moderate centrilobular and mild paraseptal emphysema. Scattered areas of cylindrical and mild varicose bronchiectasis, most  evident in the basal segments of the right lower lobe. No acute consolidative airspace disease. No significant pleural effusions. Upper Abdomen: Multiple low-attenuation lesions in the kidneys bilaterally, likely to represent cysts, largest of which is 1.4 cm in the medial aspect of the upper pole the right kidney. Musculoskeletal/Soft Tissues: There are no aggressive appearing lytic or blastic lesions noted in the visualized portions of the skeleton. IMPRESSION: 1. Status post left upper lobectomy. No findings to suggest local recurrence of disease, and no definite evidence of metastatic disease in the thorax. 2. Nonspecific 5 mm left lower lobe pulmonary nodule is stable in size compared to the prior study. Continued attention on future followup examinations is recommended. 3. Diffuse bronchial wall thickening with moderate centrilobular and mild paraseptal emphysema; imaging findings suggestive of underlying COPD. 4. Scattered areas of cylindrical and mild varicose bronchiectasis, most evident in the right lower lobe. 5. Atherosclerosis, including 2 vessel coronary artery disease. Assessment for potential risk factor modification, dietary therapy or pharmacologic therapy may be warranted, if clinically indicated. Electronically Signed   By: Vinnie Langton M.D.   On: 11/20/2014 13:38    ASSESSMENT AND PLAN: This is a very pleasant 81 years old African-American female with a stage IIA non-small cell lung cancer, status post left upper lobectomy with lymph node dissection followed by adjuvant chemotherapy with carboplatin and paclitaxel status post 4 cycles.  The patient is currently on observation and she is doing fine. The recent CT scan of the chest showed no evidence for disease recurrence. I discussed the scan results with the patient and her family. I recommended for her to continue on observation with repeat CT scan of the chest in 4 months. For the acid reflux, l asked her to take Nexium 40 mg twice  a day for one week and if no improvement she will need to see a gastroenterologist. I also strongly recommended for the patient to establish care with a primary care physician to take care of her other medical issues. She was advised to call immediately if she has any concerning symptoms in the interval. The patient voices understanding of current disease status and treatment options and is in agreement with the current care plan.  All questions were answered. The patient knows to call the clinic with any problems, questions or concerns. We can certainly see the patient much sooner if necessary.  Disclaimer: This note was dictated with voice recognition software. Similar sounding words can inadvertently be transcribed and may not be corrected upon review.

## 2014-11-22 NOTE — Telephone Encounter (Signed)
pwer pof to sch pt appt*gave pt copy of avs-adv [facility] all to sch scan

## 2015-03-20 ENCOUNTER — Telehealth: Payer: Self-pay | Admitting: Internal Medicine

## 2015-03-20 NOTE — Telephone Encounter (Signed)
pt cld to move labs-gave pt time & date of r/s lab appt

## 2015-03-21 ENCOUNTER — Ambulatory Visit (HOSPITAL_COMMUNITY): Payer: Medicare HMO

## 2015-03-21 ENCOUNTER — Other Ambulatory Visit: Payer: Medicare HMO

## 2015-03-22 ENCOUNTER — Other Ambulatory Visit: Payer: Self-pay | Admitting: *Deleted

## 2015-03-22 DIAGNOSIS — C3490 Malignant neoplasm of unspecified part of unspecified bronchus or lung: Secondary | ICD-10-CM

## 2015-03-26 ENCOUNTER — Ambulatory Visit (HOSPITAL_COMMUNITY)
Admission: RE | Admit: 2015-03-26 | Discharge: 2015-03-26 | Disposition: A | Discharge status: Home / Self Care | Payer: Medicare HMO | Source: Ambulatory Visit | Attending: Internal Medicine | Admitting: Internal Medicine

## 2015-03-26 ENCOUNTER — Encounter (HOSPITAL_COMMUNITY): Payer: Self-pay

## 2015-03-26 ENCOUNTER — Other Ambulatory Visit (HOSPITAL_BASED_OUTPATIENT_CLINIC_OR_DEPARTMENT_OTHER): Payer: Medicare HMO

## 2015-03-26 DIAGNOSIS — C3490 Malignant neoplasm of unspecified part of unspecified bronchus or lung: Secondary | ICD-10-CM

## 2015-03-26 DIAGNOSIS — J439 Emphysema, unspecified: Secondary | ICD-10-CM | POA: Insufficient documentation

## 2015-03-26 DIAGNOSIS — I2510 Atherosclerotic heart disease of native coronary artery without angina pectoris: Secondary | ICD-10-CM | POA: Insufficient documentation

## 2015-03-26 DIAGNOSIS — C3412 Malignant neoplasm of upper lobe, left bronchus or lung: Secondary | ICD-10-CM

## 2015-03-26 DIAGNOSIS — E279 Disorder of adrenal gland, unspecified: Secondary | ICD-10-CM | POA: Diagnosis not present

## 2015-03-26 DIAGNOSIS — Z902 Acquired absence of lung [part of]: Secondary | ICD-10-CM | POA: Diagnosis not present

## 2015-03-26 LAB — CBC WITH DIFFERENTIAL/PLATELET
BASO%: 0.7 % (ref 0.0–2.0)
Basophils Absolute: 0.1 10*3/uL (ref 0.0–0.1)
EOS ABS: 0.1 10*3/uL (ref 0.0–0.5)
EOS%: 1.3 % (ref 0.0–7.0)
HEMATOCRIT: 36.1 % (ref 34.8–46.6)
HGB: 11.7 g/dL (ref 11.6–15.9)
LYMPH#: 3.4 10*3/uL — AB (ref 0.9–3.3)
LYMPH%: 48.8 % (ref 14.0–49.7)
MCH: 27.9 pg (ref 25.1–34.0)
MCHC: 32.5 g/dL (ref 31.5–36.0)
MCV: 85.8 fL (ref 79.5–101.0)
MONO#: 0.4 10*3/uL (ref 0.1–0.9)
MONO%: 5.9 % (ref 0.0–14.0)
NEUT%: 43.3 % (ref 38.4–76.8)
NEUTROS ABS: 3 10*3/uL (ref 1.5–6.5)
PLATELETS: 252 10*3/uL (ref 145–400)
RBC: 4.2 10*6/uL (ref 3.70–5.45)
RDW: 15.1 % — ABNORMAL HIGH (ref 11.2–14.5)
WBC: 7 10*3/uL (ref 3.9–10.3)

## 2015-03-26 LAB — COMPREHENSIVE METABOLIC PANEL
ALBUMIN: 3.4 g/dL — AB (ref 3.5–5.0)
ALK PHOS: 79 U/L (ref 40–150)
ALT: 15 U/L (ref 0–55)
ANION GAP: 9 meq/L (ref 3–11)
AST: 18 U/L (ref 5–34)
BUN: 15.8 mg/dL (ref 7.0–26.0)
CALCIUM: 9.3 mg/dL (ref 8.4–10.4)
CO2: 20 mEq/L — ABNORMAL LOW (ref 22–29)
CREATININE: 1 mg/dL (ref 0.6–1.1)
Chloride: 112 mEq/L — ABNORMAL HIGH (ref 98–109)
EGFR: 61 mL/min/{1.73_m2} — AB (ref >90)
Glucose: 154 mg/dl — ABNORMAL HIGH (ref 70–140)
Potassium: 4.8 mEq/L (ref 3.5–5.1)
Sodium: 141 mEq/L (ref 136–145)
TOTAL PROTEIN: 7.1 g/dL (ref 6.4–8.3)

## 2015-03-26 MED ORDER — IOHEXOL 300 MG/ML  SOLN
75.0000 mL | Freq: Once | INTRAMUSCULAR | Status: AC | PRN
  Administered 2015-03-26: 75 mL via INTRAVENOUS

## 2015-03-28 ENCOUNTER — Telehealth: Payer: Self-pay | Admitting: Internal Medicine

## 2015-03-28 ENCOUNTER — Encounter: Payer: Self-pay | Admitting: Internal Medicine

## 2015-03-28 ENCOUNTER — Ambulatory Visit (HOSPITAL_BASED_OUTPATIENT_CLINIC_OR_DEPARTMENT_OTHER): Payer: Medicare HMO | Admitting: Internal Medicine

## 2015-03-28 VITALS — BP 162/69 | HR 87 | Temp 98.2°F | Resp 18 | Ht 62.0 in | Wt 198.3 lb

## 2015-03-28 DIAGNOSIS — C3412 Malignant neoplasm of upper lobe, left bronchus or lung: Secondary | ICD-10-CM

## 2015-03-28 DIAGNOSIS — R5383 Other fatigue: Secondary | ICD-10-CM | POA: Diagnosis not present

## 2015-03-28 NOTE — Telephone Encounter (Signed)
per pof to sch pt appt-gave pt copy of avs-adv [facility] will call to sch scan

## 2015-03-28 NOTE — Progress Notes (Signed)
[name] Telephone:(336) [phone]   Fax:(336) [phone]  OFFICE PROGRESS NOTE  [name], [place] [address]  DIAGNOSIS: Lung cancer  Staging form: Lung, AJCC 7th Edition  Clinical: Stage IIA (T2b, N0, M0) - Signed by [name], MD on 05/31/2014  PRIOR THERAPY:  1) Status post left upper lobectomy with lymph node dissection under the care of [name] on 05/04/2014. 2) Adjuvant chemotherapy with carboplatin for an AUC of 5 and paclitaxel 175 mg/m given every 3 weeks with Neulasta support. A total of 4 cycles of adjuvant chemotherapy are planned. Status post 4 cycles. Last dose was given 08/26/2014.  CURRENT THERAPY: Observation.  INTERVAL HISTORY: [name] [name] 82 y.o. female returns to the clinic today for follow-up visit accompanied by her son and daughter. She has been observation for the last 6 months. She is feeling fine today with no specific complaints except for mild fatigue and feeling cold all the time. She denied having any significant fever or chills, no nausea or vomiting but she has some acid reflux and she is currently on Nexium once daily. The patient denied having any significant chest pain, shortness of breath, cough or hemoptysis. She had repeat CT scan of the chest performed recently and she is here for evaluation and discussion of her scan results.  MEDICAL HISTORY: Past Medical History  Diagnosis Date  . Palpitations   . SOB (shortness of breath)   . COPD (chronic obstructive pulmonary disease) ([place])   . HTN (hypertension)   . Diabetes mellitus without complication ([place])   . Anxiety   . Arthritis   . Cancer ([facility])     ALLERGIES:  is allergic to darvon and lipitor.  MEDICATIONS:  Current Outpatient Prescriptions  Medication Sig Dispense Refill  . aspirin 81 MG tablet Take 81 mg by mouth daily.    . metoprolol tartrate (LOPRESSOR) 25 MG tablet Take 25 mg by mouth 2 (two) times daily.      . multivitamin-iron-minerals-folic acid (CENTRUM) chewable tablet Chew 1 tablet by mouth daily.    . VENTOLIN HFA 108 (90 BASE) MCG/ACT inhaler     . ALPRAZolam (XANAX) 0.5 MG tablet Take 0.5 mg by mouth 3 (three) times daily as needed for anxiety. Reported on 03/28/2015     No current facility-administered medications for this visit.    SURGICAL HISTORY:  Past Surgical History  Procedure Laterality Date  . Vaginal hysterectomy  1966  . Lung lobectomy  05/04/14    left upper lobectomy  . Video assisted thoracoscopy (vats)/ lobectomy Left 05/04/2014    Procedure: LEFT VIDEO ASSISTED THORACOSCOPY (VATS)/LEFT UPPER LOBECTOMY;  Surgeon: [name], MD;  Location: [place];  Service: Thoracic;  Laterality: Left;  . Lead removal Left 05/04/2014    Procedure: CRYO INTERCOSTAL NERVE BLOCK;  Surgeon: [name], MD;  Location: [place];  Service: Thoracic;  Laterality: Left;  . Node dissection Left 05/04/2014    Procedure: NODE DISSECTION;  Surgeon: [name], MD;  Location: [place];  Service: Thoracic;  Laterality: Left;    REVIEW OF SYSTEMS:  A comprehensive review of systems was negative except for: Constitutional: positive for fatigue Respiratory: positive for dyspnea on exertion   PHYSICAL EXAMINATION: General appearance: alert, cooperative, fatigued and no distress Head: Normocephalic, without obvious abnormality, atraumatic Neck: no adenopathy, no JVD, supple, symmetrical, trachea midline and thyroid not enlarged, symmetric, no tenderness/mass/nodules Lymph nodes: Cervical, supraclavicular, and axillary nodes normal. Resp:  clear to auscultation bilaterally Back: symmetric, no curvature. ROM normal. No CVA tenderness. Cardio: regular rate and rhythm, S1, S2 normal, no murmur, click, rub or gallop GI: soft, non-tender; bowel sounds normal; no masses,  no organomegaly Extremities: extremities normal, atraumatic, no cyanosis or edema  ECOG PERFORMANCE STATUS: 1 -  Symptomatic but completely ambulatory  Blood pressure 162/69, pulse 87, temperature 98.2 F (36.8 C), temperature source Oral, resp. rate 18, height '5\' 2"'$  (1.575 m), weight 198 lb 4.8 oz (89.948 kg), SpO2 94 %.  LABORATORY DATA: Lab Results  Component Value Date   WBC 7.0 03/26/2015   HGB 11.7 03/26/2015   HCT 36.1 03/26/2015   MCV 85.8 03/26/2015   PLT 252 03/26/2015      Chemistry      Component Value Date/Time   NA 141 03/26/2015 1042   NA 141 07/25/2014 1318   K 4.8 03/26/2015 1042   K 3.9 07/25/2014 1318   CL 110 07/25/2014 1318   CO2 20* 03/26/2015 1042   CO2 20* 07/25/2014 1318   BUN 15.8 03/26/2015 1042   BUN 16 07/25/2014 1318   CREATININE 1.0 03/26/2015 1042   CREATININE 0.77 07/25/2014 1318   CREATININE 1.00 04/11/2014 1120      Component Value Date/Time   CALCIUM 9.3 03/26/2015 1042   CALCIUM 9.5 07/25/2014 1318   ALKPHOS 79 03/26/2015 1042   ALKPHOS 69 05/06/2014 0420   AST 18 03/26/2015 1042   AST 17 05/06/2014 0420   ALT 15 03/26/2015 1042   ALT 16 05/06/2014 0420   BILITOT <0.30 03/26/2015 1042   BILITOT 0.4 05/06/2014 0420       RADIOGRAPHIC STUDIES: Ct Chest W Contrast  03/26/2015  CLINICAL DATA:  Lung cancer. Chemotherapy and radiation therapy complete 6/16. Left upper lobectomy. EXAM: CT CHEST WITH CONTRAST TECHNIQUE: Multidetector CT imaging of the chest was performed during intravenous contrast administration. CONTRAST:  12m OMNIPAQUE IOHEXOL 300 MG/ML  SOLN COMPARISON:  11/20/2014. FINDINGS: Mediastinum/Nodes: No supraclavicular adenopathy. Aortic and branch vessel atherosclerosis. Normal heart size, without pericardial effusion. Multivessel coronary artery atherosclerosis. No central pulmonary embolism, on this non-dedicated study. No mediastinal or hilar adenopathy. Small hiatal hernia. Lungs/Pleura: Similar mild left-sided pleural thickening. No pleural fluid. Status post left upper lobectomy. Advanced centrilobular and paraseptal emphysema.  Upper abdomen: Normal imaged portions of the liver, spleen, pancreas, gallbladder, left adrenal gland, left kidney. Right adrenal nodularity is similar. Upper pole right renal cyst. Interpolar right renal too small to characterize lesion. Musculoskeletal: Advanced thoracic spondylosis. IMPRESSION: 1. Status post left upper lobectomy, without recurrent or metastatic disease. 2. Advanced centrilobular and paraseptal emphysema. 3.  Atherosclerosis, including within the coronary arteries. 4. Similar right adrenal nodularity. Relatively low density on the 03/29/2014 PET and not hypermetabolic. Likely an adenoma. Recommend attention on follow-up. Electronically Signed   By: KAbigail MiyamotoM.D.   On: 03/26/2015 14:46    ASSESSMENT AND PLAN: This is a very pleasant 82years old African-American female with a stage IIA non-small cell lung cancer, status post left upper lobectomy with lymph node dissection followed by adjuvant chemotherapy with carboplatin and paclitaxel status post 4 cycles.  The patient is currently on observation and she is doing fine. The recent CT scan of the chest showed no evidence for disease recurrence. I discussed the scan results with the patient and her family. I recommended for her to continue on observation with repeat CT scan of the chest in 6 months. The patient has no primary care physician and I  strongly recommended for her to establish care with a primary care physician. She was advised to call immediately if she has any concerning symptoms in the interval. The patient voices understanding of current disease status and treatment options and is in agreement with the current care plan.  All questions were answered. The patient knows to call the clinic with any problems, questions or concerns. We can certainly see the patient much sooner if necessary.  Disclaimer: This note was dictated with voice recognition software. Similar sounding words can inadvertently be transcribed and may  not be corrected upon review.

## 2015-04-03 ENCOUNTER — Telehealth: Payer: Self-pay | Admitting: Internal Medicine

## 2015-04-03 NOTE — Telephone Encounter (Signed)
Faxed pt medical records to [facility] [phone]

## 2015-09-24 ENCOUNTER — Other Ambulatory Visit (HOSPITAL_BASED_OUTPATIENT_CLINIC_OR_DEPARTMENT_OTHER): Payer: Medicare HMO

## 2015-09-24 DIAGNOSIS — C3412 Malignant neoplasm of upper lobe, left bronchus or lung: Secondary | ICD-10-CM

## 2015-09-24 LAB — CBC WITH DIFFERENTIAL/PLATELET
BASO%: 0.4 % (ref 0.0–2.0)
BASOS ABS: 0 10*3/uL (ref 0.0–0.1)
EOS ABS: 0.1 10*3/uL (ref 0.0–0.5)
EOS%: 1.5 % (ref 0.0–7.0)
HCT: 36.9 % (ref 34.8–46.6)
HGB: 12.1 g/dL (ref 11.6–15.9)
LYMPH%: 42.5 % (ref 14.0–49.7)
MCH: 28 pg (ref 25.1–34.0)
MCHC: 32.8 g/dL (ref 31.5–36.0)
MCV: 85.4 fL (ref 79.5–101.0)
MONO#: 0.5 10*3/uL (ref 0.1–0.9)
MONO%: 7.6 % (ref 0.0–14.0)
NEUT%: 48 % (ref 38.4–76.8)
NEUTROS ABS: 3.3 10*3/uL (ref 1.5–6.5)
Platelets: 282 10*3/uL (ref 145–400)
RBC: 4.32 10*6/uL (ref 3.70–5.45)
RDW: 15.1 % — ABNORMAL HIGH (ref 11.2–14.5)
WBC: 6.9 10*3/uL (ref 3.9–10.3)
lymph#: 2.9 10*3/uL (ref 0.9–3.3)

## 2015-09-24 LAB — COMPREHENSIVE METABOLIC PANEL
ALT: 12 U/L (ref 0–55)
AST: 15 U/L (ref 5–34)
Albumin: 3.3 g/dL — ABNORMAL LOW (ref 3.5–5.0)
Alkaline Phosphatase: 78 U/L (ref 40–150)
Anion Gap: 7 mEq/L (ref 3–11)
BUN: 16.6 mg/dL (ref 7.0–26.0)
CO2: 25 meq/L (ref 22–29)
Calcium: 9.7 mg/dL (ref 8.4–10.4)
Chloride: 110 mEq/L — ABNORMAL HIGH (ref 98–109)
Creatinine: 0.9 mg/dL (ref 0.6–1.1)
EGFR: 68 mL/min/{1.73_m2} — AB (ref >90)
GLUCOSE: 93 mg/dL (ref 70–140)
POTASSIUM: 4.7 meq/L (ref 3.5–5.1)
SODIUM: 141 meq/L (ref 136–145)
Total Bilirubin: <0.3 mg/dL (ref 0.20–1.20)
Total Protein: 7.3 g/dL (ref 6.4–8.3)

## 2015-09-26 ENCOUNTER — Encounter (HOSPITAL_COMMUNITY): Payer: Self-pay

## 2015-09-26 ENCOUNTER — Ambulatory Visit (HOSPITAL_COMMUNITY)
Admission: RE | Admit: 2015-09-26 | Discharge: 2015-09-26 | Disposition: A | Discharge status: Home / Self Care | Payer: Medicare HMO | Source: Ambulatory Visit | Attending: Internal Medicine | Admitting: Internal Medicine

## 2015-09-26 DIAGNOSIS — R590 Localized enlarged lymph nodes: Secondary | ICD-10-CM | POA: Insufficient documentation

## 2015-09-26 DIAGNOSIS — E279 Disorder of adrenal gland, unspecified: Secondary | ICD-10-CM | POA: Diagnosis not present

## 2015-09-26 DIAGNOSIS — M899 Disorder of bone, unspecified: Secondary | ICD-10-CM | POA: Diagnosis not present

## 2015-09-26 DIAGNOSIS — C3412 Malignant neoplasm of upper lobe, left bronchus or lung: Secondary | ICD-10-CM | POA: Diagnosis not present

## 2015-09-26 DIAGNOSIS — J432 Centrilobular emphysema: Secondary | ICD-10-CM | POA: Diagnosis not present

## 2015-09-26 DIAGNOSIS — Z902 Acquired absence of lung [part of]: Secondary | ICD-10-CM | POA: Insufficient documentation

## 2015-09-26 MED ORDER — IOPAMIDOL (ISOVUE-300) INJECTION 61%
75.0000 mL | Freq: Once | INTRAVENOUS | Status: AC | PRN
  Administered 2015-09-26: 75 mL via INTRAVENOUS

## 2015-10-01 ENCOUNTER — Telehealth: Payer: Self-pay | Admitting: Internal Medicine

## 2015-10-01 ENCOUNTER — Encounter: Payer: Self-pay | Admitting: Internal Medicine

## 2015-10-01 ENCOUNTER — Ambulatory Visit (HOSPITAL_BASED_OUTPATIENT_CLINIC_OR_DEPARTMENT_OTHER): Payer: Medicare HMO | Admitting: Internal Medicine

## 2015-10-01 VITALS — BP 147/83 | HR 71 | Temp 98.3°F | Resp 18 | Ht 62.0 in | Wt 210.2 lb

## 2015-10-01 DIAGNOSIS — R5383 Other fatigue: Secondary | ICD-10-CM | POA: Diagnosis not present

## 2015-10-01 DIAGNOSIS — R0602 Shortness of breath: Secondary | ICD-10-CM

## 2015-10-01 DIAGNOSIS — C3412 Malignant neoplasm of upper lobe, left bronchus or lung: Secondary | ICD-10-CM

## 2015-10-01 NOTE — Progress Notes (Signed)
[facility] Telephone:(336) [phone]   Fax:(336) [phone]  OFFICE PROGRESS NOTE  [name], [place] [address]  DIAGNOSIS: Lung cancer  Staging form: Lung, AJCC 7th Edition  Clinical: Stage IIA (T2b, N0, M0) - Signed by [name], MD on 05/31/2014  PRIOR THERAPY:  1) Status post left upper lobectomy with lymph node dissection under the care of [name] on 05/04/2014. 2) Adjuvant chemotherapy with carboplatin for an AUC of 5 and paclitaxel 175 mg/m given every 3 weeks with Neulasta support. A total of 4 cycles of adjuvant chemotherapy are planned. Status post 4 cycles. Last dose was given 08/26/2014.  CURRENT THERAPY: Observation.  INTERVAL HISTORY: [name] [name] 82 y.o. female returns to the clinic today for follow-up visit accompanied by her son and daughter. She has been observation for the last 12 months. She is feeling fine today with no specific complaints except for mild fatigue and shortness of breath with exertion. She denied having any significant fever or chills, no nausea or vomiting but she has some acid reflux and she is currently on Nexium once daily. The patient denied having any significant chest pain, cough or hemoptysis. She had repeat CT scan of the chest performed recently and she is here for evaluation and discussion of her scan results.  MEDICAL HISTORY: Past Medical History:  Diagnosis Date  . Anxiety   . Arthritis   . Cancer ([facility])   . COPD (chronic obstructive pulmonary disease) ([facility])   . Diabetes mellitus without complication ([facility])   . HTN (hypertension)   . Palpitations   . Primary cancer of left upper lobe of lung ([facility]) 05/23/2014   Stage IIA- T2b,N0- thoracoscopic left upper lobectomy 05/04/14  . SOB (shortness of breath)     ALLERGIES:  is allergic to darvon [propoxyphene hcl] and lipitor [atorvastatin].  MEDICATIONS:  Current Outpatient Prescriptions  Medication Sig Dispense  Refill  . aspirin 81 MG tablet Take 81 mg by mouth daily.    Marland Kitchen esomeprazole (NEXIUM) 40 MG capsule Take 40 mg by mouth daily.    . fluorometholone (FML) 0.1 % ophthalmic suspension Place 1 drop into both eyes daily.    . metoprolol succinate (TOPROL-XL) 25 MG 24 hr tablet Take 1 tablet by mouth daily.    . metoprolol tartrate (LOPRESSOR) 25 MG tablet Take 25 mg by mouth 2 (two) times daily.    . multivitamin-iron-minerals-folic acid (CENTRUM) chewable tablet Chew 1 tablet by mouth daily.    . VENTOLIN HFA 108 (90 BASE) MCG/ACT inhaler     . ALPRAZolam (XANAX) 0.5 MG tablet Take 0.5 mg by mouth 3 (three) times daily as needed for anxiety. Reported on 03/28/2015    . traMADol (ULTRAM) 50 MG tablet Take 50 mg by mouth 2 (two) times daily as needed.     No current facility-administered medications for this visit.     SURGICAL HISTORY:  Past Surgical History:  Procedure Laterality Date  . LEAD REMOVAL Left 05/04/2014   Procedure: CRYO INTERCOSTAL NERVE BLOCK;  Surgeon: [name], MD;  Location: [facility];  Service: Thoracic;  Laterality: Left;  . LUNG LOBECTOMY  05/04/14   left upper lobectomy  . NODE DISSECTION Left 05/04/2014   Procedure: NODE DISSECTION;  Surgeon: [name], MD;  Location: [facility];  Service: Thoracic;  Laterality: Left;  Marland Kitchen VAGINAL HYSTERECTOMY  1966  . VIDEO ASSISTED THORACOSCOPY (VATS)/ LOBECTOMY Left 05/04/2014   Procedure: LEFT VIDEO ASSISTED THORACOSCOPY (  VATS)/LEFT UPPER LOBECTOMY;  Surgeon: [name], MD;  Location: [facility];  Service: Thoracic;  Laterality: Left;    REVIEW OF SYSTEMS:  A comprehensive review of systems was negative except for: Constitutional: positive for fatigue Respiratory: positive for dyspnea on exertion   PHYSICAL EXAMINATION: General appearance: alert, cooperative, fatigued and no distress Head: Normocephalic, without obvious abnormality, atraumatic Neck: no adenopathy, no JVD, supple, symmetrical, trachea midline and  thyroid not enlarged, symmetric, no tenderness/mass/nodules Lymph nodes: Cervical, supraclavicular, and axillary nodes normal. Resp: clear to auscultation bilaterally Back: symmetric, no curvature. ROM normal. No CVA tenderness. Cardio: regular rate and rhythm, S1, S2 normal, no murmur, click, rub or gallop GI: soft, non-tender; bowel sounds normal; no masses,  no organomegaly Extremities: extremities normal, atraumatic, no cyanosis or edema  ECOG PERFORMANCE STATUS: 1 - Symptomatic but completely ambulatory  Blood pressure (!) 147/83, pulse 71, temperature 98.3 F (36.8 C), temperature source Oral, resp. rate 18, height '5\' 2"'$  (1.575 m), weight 210 lb 3.2 oz (95.3 kg), SpO2 98 %.  LABORATORY DATA: Lab Results  Component Value Date   WBC 6.9 09/24/2015   HGB 12.1 09/24/2015   HCT 36.9 09/24/2015   MCV 85.4 09/24/2015   PLT 282 09/24/2015      Chemistry      Component Value Date/Time   NA 141 09/24/2015 1024   K 4.7 09/24/2015 1024   CL 110 07/25/2014 1318   CO2 25 09/24/2015 1024   BUN 16.6 09/24/2015 1024   CREATININE 0.9 09/24/2015 1024      Component Value Date/Time   CALCIUM 9.7 09/24/2015 1024   ALKPHOS 78 09/24/2015 1024   AST 15 09/24/2015 1024   ALT 12 09/24/2015 1024   BILITOT <0.30 09/24/2015 1024       RADIOGRAPHIC STUDIES: Ct Chest W Contrast  Result Date: 09/26/2015 CLINICAL DATA:  LEFT upper lobectomy with chemotherapy completed June 2016. EXAM: CT CHEST WITH CONTRAST TECHNIQUE: Multidetector CT imaging of the chest was performed during intravenous contrast administration. CONTRAST:  86m ISOVUE-300 IOPAMIDOL (ISOVUE-300) INJECTION 61% COMPARISON:  CT 03/26/2015, 11/20/2014 FINDINGS: Cardiovascular: Coronary artery calcification and aortic atherosclerotic calcification. No pericardial fluid. Mediastinum/Nodes: RIGHT hilar lymph node measuring 18 mm short axis is similar size to comparison exam but more prominent which may be secondary to technique. Note  mediastinal lymphadenopathy. The small paratracheal nodes are unchanged. No supraclavicular adenopathy. No axillary adenopathy. Lungs/Pleura: Centrilobular emphysema in the upper lobes. Volume loss in the LEFT hemi thorax consistent with LEFT upper lobectomy. The bronchiectasis in the RIGHT lower lobe. No measurable nodularity within the lungs. Upper Abdomen: Moderate hiatal hernia noted adrenal glands are normal. Mild thickening of the RIGHT adrenal gland is stable. Musculoskeletal: Diffuse sclerosis throughout the bones of the thoracic spine unchanged from comparison exams. IMPRESSION: 1. Patient status post LEFT upper lobectomy without evidence of local recurrence. 2. Prominent RIGHT hilar lymph node likely related to COPD. Recommend attention on follow-up. 3. Emphysematous change . 4. Stable thickening of the RIGHT adrenal gland. 5. Diffuse skeletal sclerosis favored metabolic or degenerative. Electronically Signed   By: SSuzy BouchardM.D.   On: 09/26/2015 16:38    ASSESSMENT AND PLAN: This is a very pleasant 82years old African-American female with a stage IIA non-small cell lung cancer, status post left upper lobectomy with lymph node dissection followed by adjuvant chemotherapy with carboplatin and paclitaxel status post 4 cycles.  The patient is currently on observation with no specific complaints except for shortness breath with exertion. The  recent CT scan of the chest showed no evidence for disease recurrence. I discussed the scan results with the patient and her family. I recommended for her to continue on observation with repeat CT scan of the chest in 6 months. She was advised to call immediately if she has any concerning symptoms in the interval. The patient voices understanding of current disease status and treatment options and is in agreement with the current care plan.  All questions were answered. The patient knows to call the clinic with any problems, questions or concerns. We can  certainly see the patient much sooner if necessary.  Disclaimer: This note was dictated with voice recognition software. Similar sounding words can inadvertently be transcribed and may not be corrected upon review.

## 2015-10-01 NOTE — Telephone Encounter (Signed)
Gave patient avs report and appointments for February. Central radiology scheduling will contact patient re scans - patient aware.

## 2016-03-31 ENCOUNTER — Other Ambulatory Visit (HOSPITAL_BASED_OUTPATIENT_CLINIC_OR_DEPARTMENT_OTHER): Payer: Medicare HMO

## 2016-03-31 ENCOUNTER — Ambulatory Visit (HOSPITAL_COMMUNITY)
Admission: RE | Admit: 2016-03-31 | Discharge: 2016-03-31 | Disposition: A | Discharge status: Home / Self Care | Payer: Medicare HMO | Source: Ambulatory Visit | Attending: Internal Medicine | Admitting: Internal Medicine

## 2016-03-31 ENCOUNTER — Encounter (HOSPITAL_COMMUNITY): Payer: Self-pay

## 2016-03-31 DIAGNOSIS — C3412 Malignant neoplasm of upper lobe, left bronchus or lung: Secondary | ICD-10-CM | POA: Insufficient documentation

## 2016-03-31 DIAGNOSIS — R590 Localized enlarged lymph nodes: Secondary | ICD-10-CM | POA: Insufficient documentation

## 2016-03-31 DIAGNOSIS — I2510 Atherosclerotic heart disease of native coronary artery without angina pectoris: Secondary | ICD-10-CM | POA: Diagnosis not present

## 2016-03-31 DIAGNOSIS — J449 Chronic obstructive pulmonary disease, unspecified: Secondary | ICD-10-CM | POA: Insufficient documentation

## 2016-03-31 DIAGNOSIS — Z902 Acquired absence of lung [part of]: Secondary | ICD-10-CM | POA: Diagnosis not present

## 2016-03-31 DIAGNOSIS — I700 Atherosclerosis of aorta: Secondary | ICD-10-CM | POA: Insufficient documentation

## 2016-03-31 LAB — COMPREHENSIVE METABOLIC PANEL
ALBUMIN: 3.4 g/dL — AB (ref 3.5–5.0)
ALK PHOS: 85 U/L (ref 40–150)
ALT: 12 U/L (ref 0–55)
ANION GAP: 10 meq/L (ref 3–11)
AST: 12 U/L (ref 5–34)
BILIRUBIN TOTAL: 0.26 mg/dL (ref 0.20–1.20)
BUN: 14.5 mg/dL (ref 7.0–26.0)
CALCIUM: 9.8 mg/dL (ref 8.4–10.4)
CO2: 22 mEq/L (ref 22–29)
Chloride: 111 mEq/L — ABNORMAL HIGH (ref 98–109)
Creatinine: 1.1 mg/dL (ref 0.6–1.1)
EGFR: 57 mL/min/{1.73_m2} — AB (ref >90)
Glucose: 133 mg/dl (ref 70–140)
POTASSIUM: 4.1 meq/L (ref 3.5–5.1)
SODIUM: 143 meq/L (ref 136–145)
Total Protein: 7.3 g/dL (ref 6.4–8.3)

## 2016-03-31 LAB — CBC WITH DIFFERENTIAL/PLATELET
BASO%: 0.2 % (ref 0.0–2.0)
BASOS ABS: 0 10*3/uL (ref 0.0–0.1)
EOS ABS: 0.1 10*3/uL (ref 0.0–0.5)
EOS%: 1.2 % (ref 0.0–7.0)
HEMATOCRIT: 35.1 % (ref 34.8–46.6)
HEMOGLOBIN: 11.8 g/dL (ref 11.6–15.9)
LYMPH%: 20.1 % (ref 14.0–49.7)
MCH: 28.2 pg (ref 25.1–34.0)
MCHC: 33.6 g/dL (ref 31.5–36.0)
MCV: 84 fL (ref 79.5–101.0)
MONO#: 0.7 10*3/uL (ref 0.1–0.9)
MONO%: 6.2 % (ref 0.0–14.0)
NEUT#: 7.6 10*3/uL — ABNORMAL HIGH (ref 1.5–6.5)
NEUT%: 72.3 % (ref 38.4–76.8)
PLATELETS: 240 10*3/uL (ref 145–400)
RBC: 4.18 10*6/uL (ref 3.70–5.45)
RDW: 14.5 % (ref 11.2–14.5)
WBC: 10.5 10*3/uL — ABNORMAL HIGH (ref 3.9–10.3)
lymph#: 2.1 10*3/uL (ref 0.9–3.3)

## 2016-03-31 MED ORDER — IOPAMIDOL (ISOVUE-300) INJECTION 61%
75.0000 mL | Freq: Once | INTRAVENOUS | Status: AC | PRN
  Administered 2016-03-31: 75 mL via INTRAVENOUS

## 2016-03-31 MED ORDER — SODIUM CHLORIDE 0.9 % IJ SOLN
INTRAMUSCULAR | Status: AC
  Filled 2016-03-31: qty 50

## 2016-03-31 MED ORDER — IOPAMIDOL (ISOVUE-300) INJECTION 61%
INTRAVENOUS | Status: AC
  Filled 2016-03-31: qty 75

## 2016-04-07 ENCOUNTER — Telehealth: Payer: Self-pay | Admitting: Internal Medicine

## 2016-04-07 ENCOUNTER — Ambulatory Visit (HOSPITAL_BASED_OUTPATIENT_CLINIC_OR_DEPARTMENT_OTHER): Payer: Medicare HMO | Admitting: Internal Medicine

## 2016-04-07 ENCOUNTER — Encounter: Payer: Self-pay | Admitting: Internal Medicine

## 2016-04-07 VITALS — BP 149/89 | HR 87 | Temp 98.1°F | Resp 18 | Ht 62.0 in | Wt 207.7 lb

## 2016-04-07 DIAGNOSIS — C3412 Malignant neoplasm of upper lobe, left bronchus or lung: Secondary | ICD-10-CM

## 2016-04-07 DIAGNOSIS — J449 Chronic obstructive pulmonary disease, unspecified: Secondary | ICD-10-CM

## 2016-04-07 NOTE — Telephone Encounter (Signed)
atc Dr. Arvilla Market office [phone], was on hold on doc line over 5 minutes.  Wcb.

## 2016-04-07 NOTE — Telephone Encounter (Signed)
Patient refused to see past Pulmonologist @ [facility] and requested to be seen by a different doctor, Per Sharl Ma @ [facility], she will give patient a call back with appointment to see new Pulmonologist there. 04/07/16

## 2016-04-07 NOTE — Telephone Encounter (Signed)
Appointments scheduled per 04/07/16 los. Patient was given a copy of the AVS report and appointments schedule, per 04/07/16 los.

## 2016-04-07 NOTE — Progress Notes (Signed)
[facility] Telephone:(336) [phone]   Fax:(336) [phone]  OFFICE PROGRESS NOTE  LONG,SCOTT, PA-C [place] [address]  DIAGNOSIS: Lung cancer  Staging form: Lung, AJCC 7th Edition  Clinical: Stage IIA (T2b, N0, M0) - Signed by [name], MD on 05/31/2014  PRIOR THERAPY:  1) Status post left upper lobectomy with lymph node dissection under the care of [name] on 05/04/2014. 2) Adjuvant chemotherapy with carboplatin for an AUC of 5 and paclitaxel 175 mg/m given every 3 weeks with Neulasta support. A total of 4 cycles of adjuvant chemotherapy are planned. Status post 4 cycles. Last dose was given 08/26/2014.  CURRENT THERAPY: Observation.  INTERVAL HISTORY: [name] [name] 83 y.o. female came to the clinic today for follow-up visit accompanied by her daughter. The patient is feeling fine today with no specific complaints except for shortness of breath with exertion. She denied having any chest pain but has mild cough with no hemoptysis. She was seen in the past by pulmonary medicine before her diagnosis of the lung cancer but no follow-up visit since that time. She denied having any weight loss or night sweats. She has no nausea, vomiting, diarrhea or constipation. She had repeat CT scan of the chest performed recently and she is here for evaluation and discussion of her scan results.   MEDICAL HISTORY: Past Medical History:  Diagnosis Date  . Anxiety   . Arthritis   . Cancer ([facility])   . COPD (chronic obstructive pulmonary disease) ([facility])   . Diabetes mellitus without complication ([facility])   . HTN (hypertension)   . Palpitations   . Primary cancer of left upper lobe of lung ([facility]) 05/23/2014   Stage IIA- T2b,N0- thoracoscopic left upper lobectomy 05/04/14  . SOB (shortness of breath)     ALLERGIES:  is allergic to darvon [propoxyphene hcl] and lipitor [atorvastatin].  MEDICATIONS:  Current Outpatient Prescriptions  Medication  Sig Dispense Refill  . ADVAIR DISKUS 250-50 MCG/DOSE AEPB Inhale 1 puff into the lungs 2 (two) times daily.    Marland Kitchen ALPRAZolam (XANAX) 0.5 MG tablet Take 0.5 mg by mouth 3 (three) times daily as needed for anxiety. Reported on 03/28/2015    . aspirin 81 MG tablet Take 81 mg by mouth daily.    Marland Kitchen esomeprazole (NEXIUM) 40 MG capsule Take 40 mg by mouth daily.    . fluorometholone (FML) 0.1 % ophthalmic suspension Place 1 drop into both eyes daily.    . metoprolol succinate (TOPROL-XL) 25 MG 24 hr tablet Take 1 tablet by mouth daily.    . metoprolol tartrate (LOPRESSOR) 25 MG tablet Take 25 mg by mouth 2 (two) times daily.    . multivitamin-iron-minerals-folic acid (CENTRUM) chewable tablet Chew 1 tablet by mouth daily.    . traMADol (ULTRAM) 50 MG tablet Take 50 mg by mouth 2 (two) times daily as needed.    . valsartan-hydrochlorothiazide (DIOVAN-HCT) 160-25 MG tablet Take 1 tablet by mouth daily.    . VENTOLIN HFA 108 (90 BASE) MCG/ACT inhaler      No current facility-administered medications for this visit.     SURGICAL HISTORY:  Past Surgical History:  Procedure Laterality Date  . LEAD REMOVAL Left 05/04/2014   Procedure: CRYO INTERCOSTAL NERVE BLOCK;  Surgeon: [name], MD;  Location: [facility];  Service: Thoracic;  Laterality: Left;  . LUNG LOBECTOMY  05/04/14   left upper lobectomy  . NODE DISSECTION Left 05/04/2014   Procedure: NODE DISSECTION;  Surgeon: [name], MD;  Location: [facility];  Service: Thoracic;  Laterality: Left;  Marland Kitchen VAGINAL HYSTERECTOMY  1966  . VIDEO ASSISTED THORACOSCOPY (VATS)/ LOBECTOMY Left 05/04/2014   Procedure: LEFT VIDEO ASSISTED THORACOSCOPY (VATS)/LEFT UPPER LOBECTOMY;  Surgeon: [name], MD;  Location: [facility];  Service: Thoracic;  Laterality: Left;    REVIEW OF SYSTEMS:  A comprehensive review of systems was negative except for: Respiratory: positive for cough and dyspnea on exertion   PHYSICAL EXAMINATION: General appearance: alert,  cooperative and no distress Head: Normocephalic, without obvious abnormality, atraumatic Neck: no adenopathy, no JVD, supple, symmetrical, trachea midline and thyroid not enlarged, symmetric, no tenderness/mass/nodules Lymph nodes: Cervical, supraclavicular, and axillary nodes normal. Resp: clear to auscultation bilaterally Back: symmetric, no curvature. ROM normal. No CVA tenderness. Cardio: regular rate and rhythm, S1, S2 normal, no murmur, click, rub or gallop GI: soft, non-tender; bowel sounds normal; no masses,  no organomegaly Extremities: extremities normal, atraumatic, no cyanosis or edema  ECOG PERFORMANCE STATUS: 1 - Symptomatic but completely ambulatory  Blood pressure (!) 149/89, pulse 87, temperature 98.1 F (36.7 C), temperature source Oral, resp. rate 18, height '5\' 2"'$  (1.575 m), weight 207 lb 11.2 oz (94.2 kg), SpO2 94 %.  LABORATORY DATA: Lab Results  Component Value Date   WBC 10.5 (H) 03/31/2016   HGB 11.8 03/31/2016   HCT 35.1 03/31/2016   MCV 84.0 03/31/2016   PLT 240 03/31/2016      Chemistry      Component Value Date/Time   NA 143 03/31/2016 0912   K 4.1 03/31/2016 0912   CL 110 07/25/2014 1318   CO2 22 03/31/2016 0912   BUN 14.5 03/31/2016 0912   CREATININE 1.1 03/31/2016 0912      Component Value Date/Time   CALCIUM 9.8 03/31/2016 0912   ALKPHOS 85 03/31/2016 0912   AST 12 03/31/2016 0912   ALT 12 03/31/2016 0912   BILITOT 0.26 03/31/2016 0912       RADIOGRAPHIC STUDIES: Ct Chest W Contrast  Result Date: 03/31/2016 CLINICAL DATA:  Lung cancer. Followup. Status post chemotherapy. Currently asymptomatic. EXAM: CT CHEST WITH CONTRAST TECHNIQUE: Multidetector CT imaging of the chest was performed during intravenous contrast administration. CONTRAST:  2m ISOVUE-300 IOPAMIDOL (ISOVUE-300) INJECTION 61% COMPARISON:  09/26/15 FINDINGS: Cardiovascular: The heart size is normal. No pericardial effusion. Aortic atherosclerosis. Calcification in the RCA  and LAD coronary artery. Mediastinum/Nodes: The trachea appears patent and is midline. Normal appearance of the esophagus. The index right perihilar lymph node is stable measuring 1.7 x 2.5 cm, image 62 of series 2. No enlarged mediastinal or left hilar lymph nodes. No axillary or supraclavicular adenopathy. Lungs/Pleura: Advanced changes of COPD/ emphysema identified. Diffuse bronchial wall thickening noted. Status post left upper lobectomy. Nodular density with surrounding architectural distortion in the right upper lobe is again identified, image 26 of series 7. This measures approximately 1 cm, image 113 of series 5 and appears unchanged when compared with previous study. Upper Abdomen: No acute abnormality. Musculoskeletal: No aggressive lytic or aggressive sclerotic bone lesions. Multi level degenerative disc disease is identified throughout the thoracic spine. IMPRESSION: 1. Stable CT of the chest status post left upper lobectomy. 2. Nodular density with surrounding architectural distortion in the right upper lobe is unchanged from previous exam. Although stability is encouraging continued interval follow-up of this nodule is advised to rule out the possibility developing a second pulmonary neoplasm. 3. Stable enlarged right hilar lymph node. 4. Advanced changes of COPD/emphysema 5. Aortic  atherosclerosis and coronary artery calcifications. Electronically Signed   By: Kerby Moors M.D.   On: 03/31/2016 13:55    ASSESSMENT AND PLAN:  This is a very pleasant 83 years old African-American female with a stage II a non-small cell lung cancer status post left upper lobectomy with lymph node dissection followed by 4 cycles of adjuvant systemic chemotherapy with carboplatin and paclitaxel The patient has been on observation since July 2016. Her recent CT scan of the chest showed no clear evidence for disease progression but she continues to have some nodularity in the right upper lobe that unchanged from the  previous exam as well as a stable right hilar lymph node. I discussed the scan results and showed the images to the patient and her daughter. I recommended for her to continue on observation with repeat CT scan of the chest in 6 months. For COPD, I referred the patient back to pulmonary medicine for evaluation. She was advised to call immediately if she has any concerning symptoms in the interval. The patient voices understanding of current disease status and treatment options and is in agreement with the current care plan.  All questions were answered. The patient knows to call the clinic with any problems, questions or concerns. We can certainly see the patient much sooner if necessary. I spent 10 minutes counseling the patient face to face. The total time spent in the appointment was 15 minutes.  Disclaimer: This note was dictated with voice recognition software. Similar sounding words can inadvertently be transcribed and may not be corrected upon review.

## 2016-04-10 ENCOUNTER — Telehealth: Payer: Self-pay | Admitting: Medical Oncology

## 2016-04-10 NOTE — Telephone Encounter (Signed)
Diane with Dr. Arvilla Market office, states that they have no preference as to who else pt sees, just she needs to be seen.  JN has next availability for consult.  JN please advise if you're ok with taking on patient.  Thanks!

## 2016-04-10 NOTE — Telephone Encounter (Signed)
returned call

## 2016-04-10 NOTE — Telephone Encounter (Signed)
Called Dr Arvilla Market office, LM for Dr Arvilla Market nurse x 1

## 2016-04-10 NOTE — Telephone Encounter (Signed)
Ardeen Garland, RN  1 hour ago (1:11 PM)   Trying to contact you re pt appt.-on hold. My direct number is [phone]. I will return at 130 (Routing comment)    Called and LM x 1

## 2016-04-10 NOTE — Telephone Encounter (Signed)
Will close this message as there is another telephone note open.

## 2016-04-10 NOTE — Telephone Encounter (Signed)
Pt scheduled for 05/06/16 at 245 with JN. Pt aware. Nothing further needed.

## 2016-04-10 NOTE — Telephone Encounter (Signed)
Caryl Pina will get appt scheduled with pt

## 2016-04-10 NOTE — Telephone Encounter (Signed)
Fine with me

## 2016-05-06 ENCOUNTER — Telehealth: Payer: Self-pay | Admitting: Pulmonary Disease

## 2016-05-06 ENCOUNTER — Institutional Professional Consult (permissible substitution): Payer: Medicare HMO | Admitting: Pulmonary Disease

## 2016-05-06 NOTE — Telephone Encounter (Signed)
PFT 03/22/14: FVC 2.77 L (132%) FEV1 2.57 L (102%) FEV1/FVC 0.57 FEF 25-75 0.72 L (47%)  IMAGING CT CHEST W/ CONTRAST 03/31/16 (personally reviewed by me):  Volume loss on the left with apical predominant emphysematous changes. Right hilar lymph node appears enlarged. No pathologic mediastinal adenopathy however. No pleural effusion or thickening. No pericardial effusion. Slight nodular density within right upper lobe appreciated.  CARDIAC TTE (01/21/12): LV normal in size with mild LVH. EF 55-60%. LA & RA normal in size. RV normal in size and function. No aortic stenosis or regurgitation. Aortic root normal in size. Trivial mitral regurgitation without stenosis. No significant pulmonic regurgitation. Trivial tricuspid regurgitation. No pericardial effusion.  PATHOLOGY Left Upper Lobe Resection & Lymph Node Dissection (05/04/14): Invasive moderately to poorly differentiated squamous cell carcinoma spanning 6 cm in greatest dimension. Margins negative. Focal superficial visceropleural invasion identified. Levels 10L, 11L, 7, & 5 lymph nodes biopsied without malignancy or atypia. pT2b, pN0.  LABS 05/02/14 ABG on RA:  7.42 / 35 / 69 / 93%

## 2016-06-03 IMAGING — CT CT CHEST W/ CM
2 of 4 series · 15 of 36 positions shown, 18 images · IV contrast (OMNIPAQUE)
Comparison: Chest CT 08/21/2014.  PET-CT 03/29/2014.

CLINICAL DATA: 80-year-old female with history of lung cancer
status post left upper lobectomy. Patient recently completed
chemotherapy. Followup study.

EXAM:
CT CHEST WITH CONTRAST
TECHNIQUE: Multidetector CT imaging of the chest was performed during
intravenous contrast administration.
CONTRAST:  75mL OMNIPAQUE IOHEXOL 300 MG/ML  SOLN

[Series 2: chest with st · axial · 0.69mm/px · z∈[-278,-38]mm · 12 of 58 slices shown, 15 images]
[im 5/58  mediastinal]
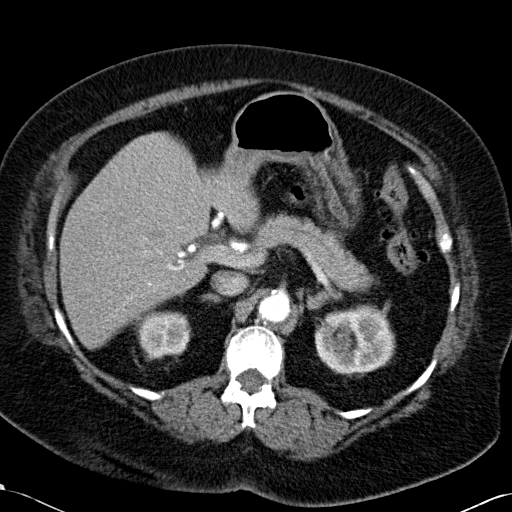
[im 5/58  lung]
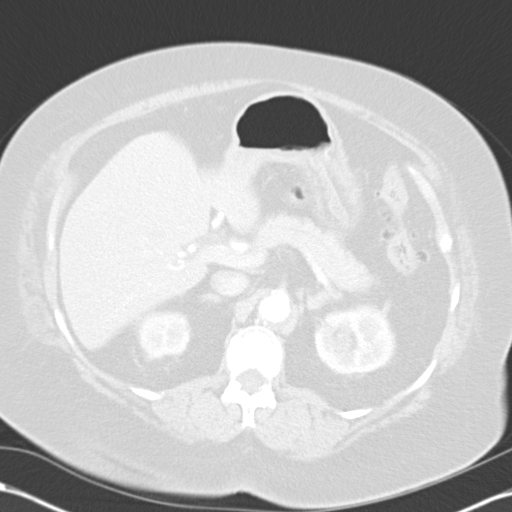
[im 9/58  lung]
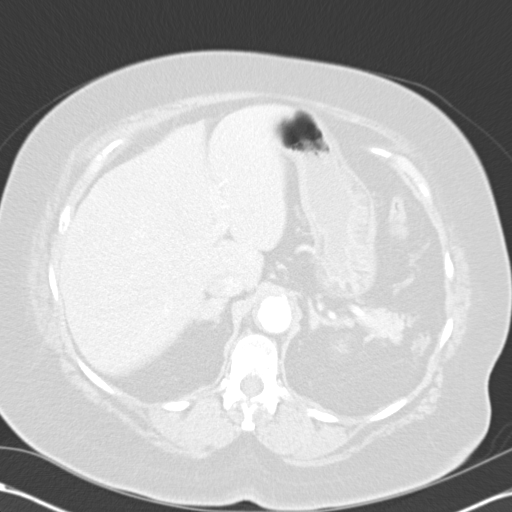
[im 13/58  lung]
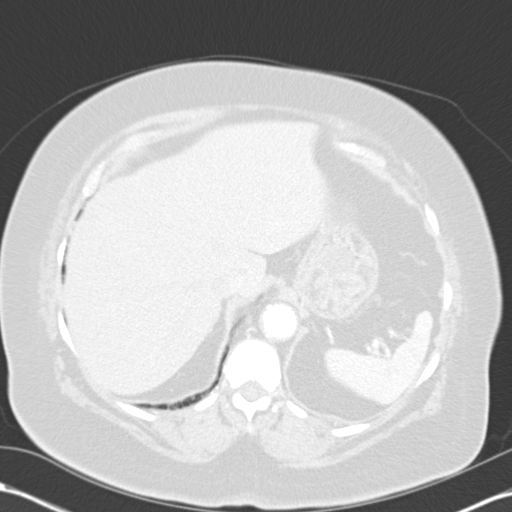
[im 17/58  lung]
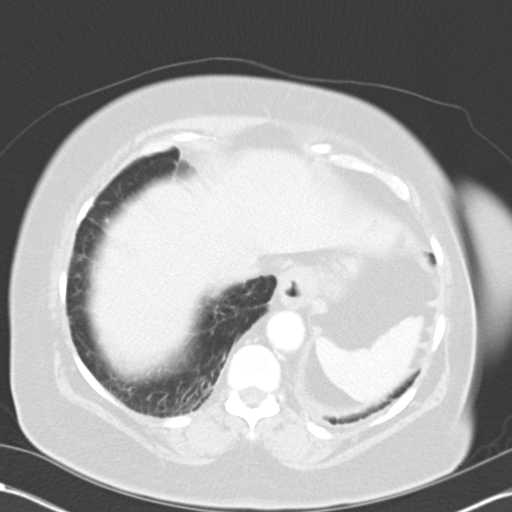
[im 21/58  mediastinal]
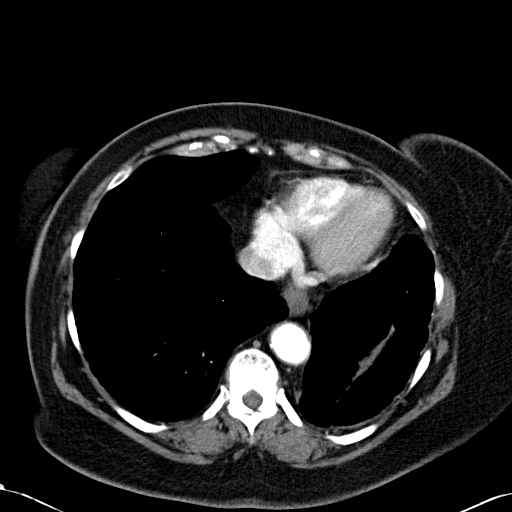
[im 21/58  lung]
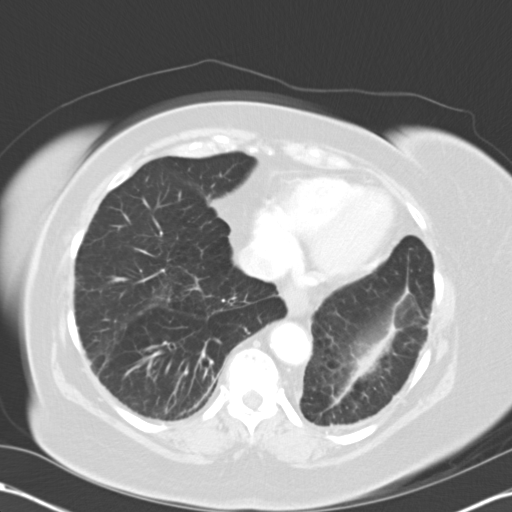
[im 25/58  lung]
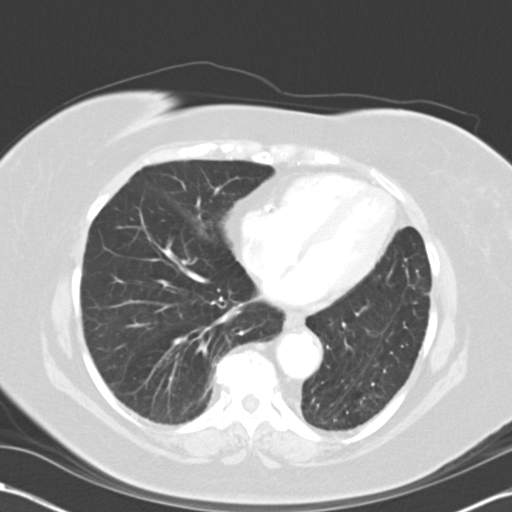
[im 33/58  lung]
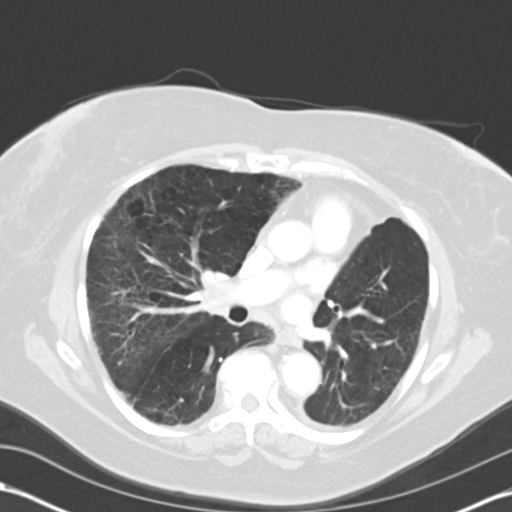
[im 37/58  lung]
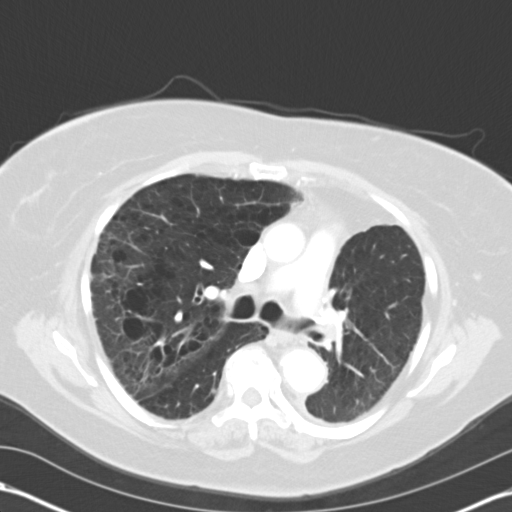
[im 41/58  mediastinal]
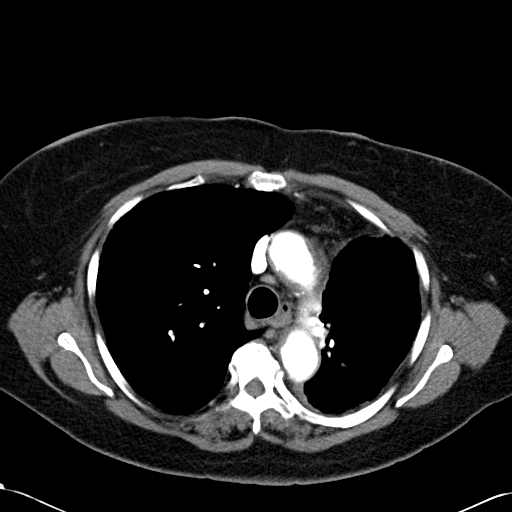
[im 41/58  lung]
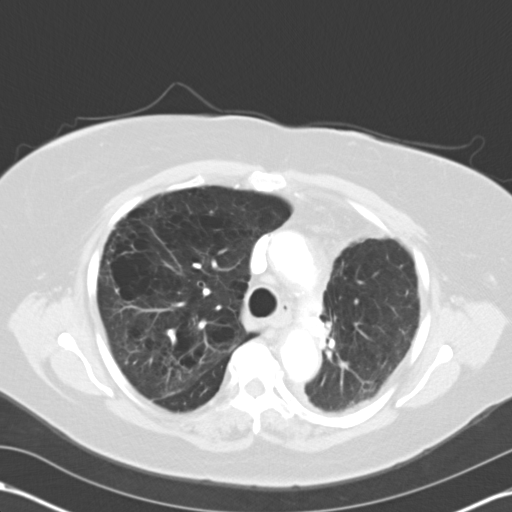
[im 45/58  lung]
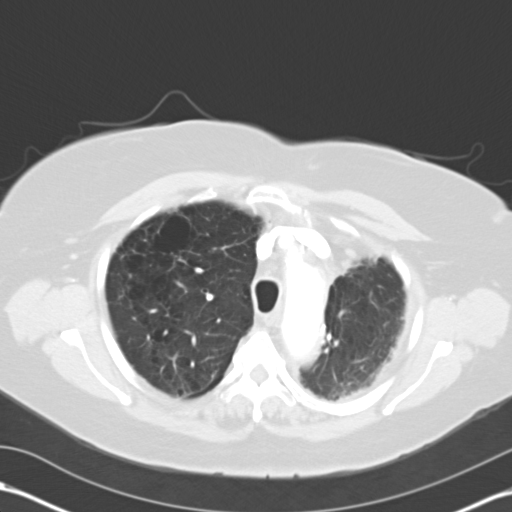
[im 49/58  lung]
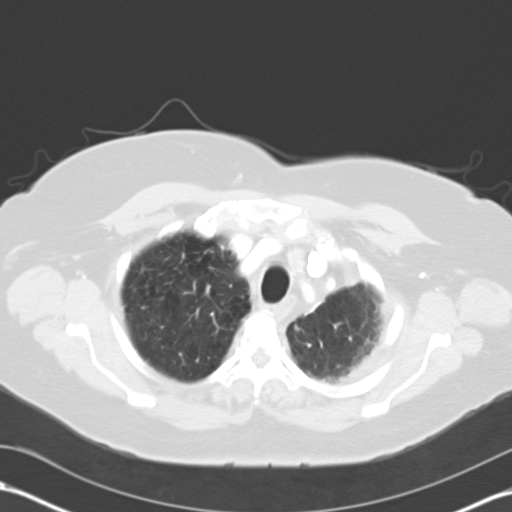
[im 53/58  lung]
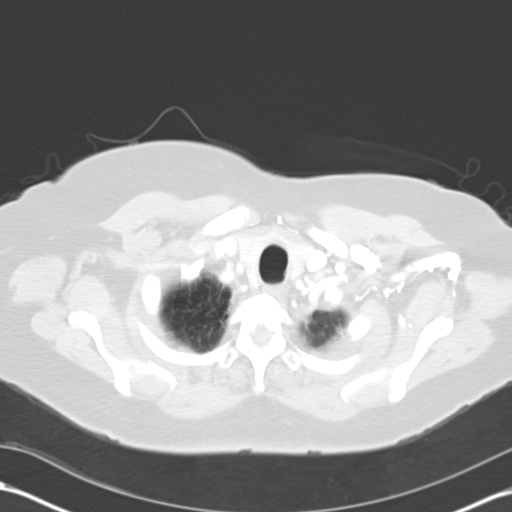

[Series 602: cor · coronal · 0.69mm/px · 3 of 91 slices shown]
[im 19/91  lung]
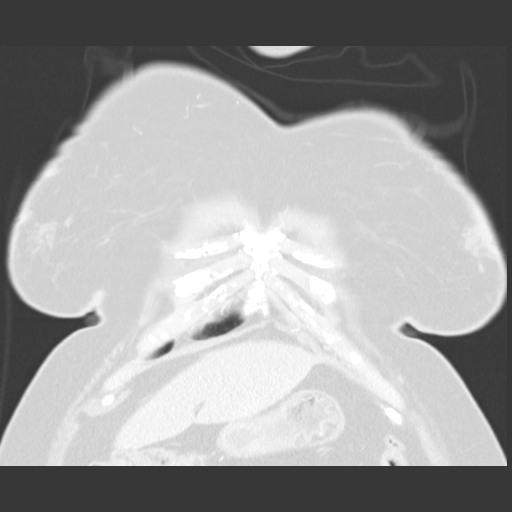
[im 37/91  lung]
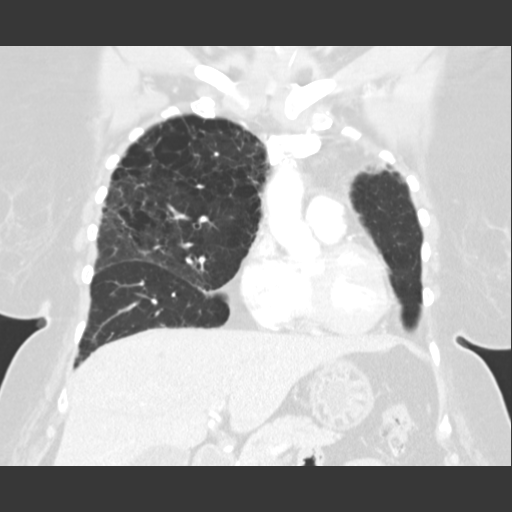
[im 55/91  lung]
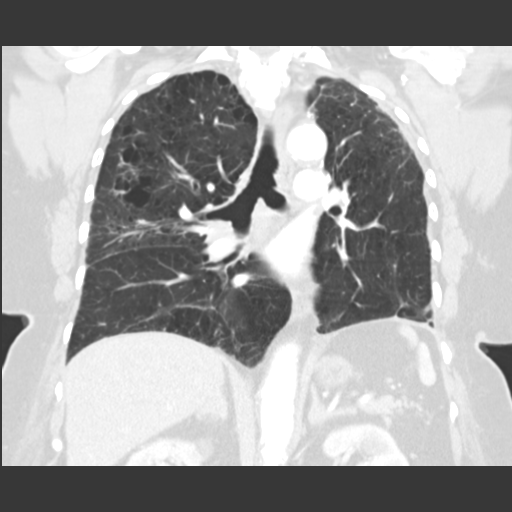

[15 of 36 positions shown; findings below may reference images not displayed]

FINDINGS: Mediastinum/Lymph Nodes: Heart size is normal. There is no
significant pericardial fluid, thickening or pericardial
calcification. There is atherosclerosis of the thoracic aorta, the
great vessels of the mediastinum and the coronary arteries,
including calcified atherosclerotic plaque in the left anterior
descending and right coronary arteries. No pathologically enlarged
mediastinal or hilar lymph nodes. Small hiatal hernia. No axillary
lymphadenopathy.

Lungs/Pleura: Status post left upper lobectomy. Compensatory
hyperexpansion of the left lower lobe. 5 mm left lower lobe nodule
(image 15 of series 5) is unchanged. 7 mm partially calcified
pleural-based nodule in the posterior right upper lobe is unchanged,
likely a calcified granuloma. No other larger more suspicious
appearing pulmonary nodules or masses are noted. Previously noted
small anteriorly loculated pleural fluid collection in the left
hemithorax has resolved. Mild diffuse bronchial wall thickening with
moderate centrilobular and mild paraseptal emphysema. Scattered
areas of cylindrical and mild varicose bronchiectasis, most evident
in the basal segments of the right lower lobe. No acute
consolidative airspace disease. No significant pleural effusions.

Upper Abdomen: Multiple low-attenuation lesions in the kidneys
bilaterally, likely to represent cysts, largest of which is 1.4 cm
in the medial aspect of the upper pole the right kidney.

Musculoskeletal/Soft Tissues: There are no aggressive appearing
lytic or blastic lesions noted in the visualized portions of the
skeleton.
IMPRESSION: 1. Status post left upper lobectomy. No findings to suggest local
recurrence of disease, and no definite evidence of metastatic
disease in the thorax.
2. Nonspecific 5 mm left lower lobe pulmonary nodule is stable in
size compared to the prior study. Continued attention on future
followup examinations is recommended.
3. Diffuse bronchial wall thickening with moderate centrilobular and
mild paraseptal emphysema; imaging findings suggestive of underlying
COPD.
4. Scattered areas of cylindrical and mild varicose bronchiectasis,
most evident in the right lower lobe.
5. Atherosclerosis, including 2 vessel coronary artery disease.
Assessment for potential risk factor modification, dietary therapy
or pharmacologic therapy may be warranted, if clinically indicated.

## 2016-10-03 ENCOUNTER — Other Ambulatory Visit (HOSPITAL_BASED_OUTPATIENT_CLINIC_OR_DEPARTMENT_OTHER): Payer: Medicare HMO

## 2016-10-03 ENCOUNTER — Ambulatory Visit (HOSPITAL_COMMUNITY)
Admission: RE | Admit: 2016-10-03 | Discharge: 2016-10-03 | Disposition: A | Discharge status: Home / Self Care | Payer: Medicare HMO | Source: Ambulatory Visit | Attending: Internal Medicine | Admitting: Internal Medicine

## 2016-10-03 DIAGNOSIS — R590 Localized enlarged lymph nodes: Secondary | ICD-10-CM | POA: Diagnosis not present

## 2016-10-03 DIAGNOSIS — Z902 Acquired absence of lung [part of]: Secondary | ICD-10-CM | POA: Diagnosis not present

## 2016-10-03 DIAGNOSIS — C3412 Malignant neoplasm of upper lobe, left bronchus or lung: Secondary | ICD-10-CM

## 2016-10-03 DIAGNOSIS — J432 Centrilobular emphysema: Secondary | ICD-10-CM | POA: Diagnosis not present

## 2016-10-03 DIAGNOSIS — I2510 Atherosclerotic heart disease of native coronary artery without angina pectoris: Secondary | ICD-10-CM | POA: Insufficient documentation

## 2016-10-03 DIAGNOSIS — J449 Chronic obstructive pulmonary disease, unspecified: Secondary | ICD-10-CM

## 2016-10-03 DIAGNOSIS — R918 Other nonspecific abnormal finding of lung field: Secondary | ICD-10-CM | POA: Insufficient documentation

## 2016-10-03 DIAGNOSIS — I700 Atherosclerosis of aorta: Secondary | ICD-10-CM | POA: Diagnosis not present

## 2016-10-03 LAB — COMPREHENSIVE METABOLIC PANEL
ALBUMIN: 3.5 g/dL (ref 3.5–5.0)
ALK PHOS: 81 U/L (ref 40–150)
ALT: 15 U/L (ref 0–55)
AST: 18 U/L (ref 5–34)
Anion Gap: 8 mEq/L (ref 3–11)
BUN: 17.9 mg/dL (ref 7.0–26.0)
CALCIUM: 9.8 mg/dL (ref 8.4–10.4)
CHLORIDE: 112 meq/L — AB (ref 98–109)
CO2: 23 mEq/L (ref 22–29)
CREATININE: 1.1 mg/dL (ref 0.6–1.1)
EGFR: 53 mL/min/{1.73_m2} — ABNORMAL LOW (ref >90)
Glucose: 107 mg/dl (ref 70–140)
Potassium: 4 mEq/L (ref 3.5–5.1)
Sodium: 143 mEq/L (ref 136–145)
TOTAL PROTEIN: 7.4 g/dL (ref 6.4–8.3)
Total Bilirubin: 0.3 mg/dL (ref 0.20–1.20)

## 2016-10-03 LAB — CBC WITH DIFFERENTIAL/PLATELET
BASO%: 0.5 % (ref 0.0–2.0)
Basophils Absolute: 0 10*3/uL (ref 0.0–0.1)
EOS%: 2.4 % (ref 0.0–7.0)
Eosinophils Absolute: 0.2 10*3/uL (ref 0.0–0.5)
HCT: 37.5 % (ref 34.8–46.6)
HEMOGLOBIN: 12.3 g/dL (ref 11.6–15.9)
LYMPH#: 2.7 10*3/uL (ref 0.9–3.3)
LYMPH%: 34.5 % (ref 14.0–49.7)
MCH: 28.3 pg (ref 25.1–34.0)
MCHC: 32.8 g/dL (ref 31.5–36.0)
MCV: 86.4 fL (ref 79.5–101.0)
MONO#: 0.6 10*3/uL (ref 0.1–0.9)
MONO%: 8 % (ref 0.0–14.0)
NEUT%: 54.6 % (ref 38.4–76.8)
NEUTROS ABS: 4.3 10*3/uL (ref 1.5–6.5)
Platelets: 285 10*3/uL (ref 145–400)
RBC: 4.34 10*6/uL (ref 3.70–5.45)
RDW: 15.1 % — AB (ref 11.2–14.5)
WBC: 7.9 10*3/uL (ref 3.9–10.3)

## 2016-10-03 MED ORDER — IOPAMIDOL (ISOVUE-300) INJECTION 61%
INTRAVENOUS | Status: AC
  Filled 2016-10-03: qty 75

## 2016-10-03 MED ORDER — IOPAMIDOL (ISOVUE-300) INJECTION 61%
75.0000 mL | Freq: Once | INTRAVENOUS | Status: AC | PRN
  Administered 2016-10-03: 75 mL via INTRAVENOUS

## 2016-10-06 ENCOUNTER — Telehealth: Payer: Self-pay | Admitting: Internal Medicine

## 2016-10-06 ENCOUNTER — Ambulatory Visit (HOSPITAL_BASED_OUTPATIENT_CLINIC_OR_DEPARTMENT_OTHER): Payer: Medicare HMO | Admitting: Internal Medicine

## 2016-10-06 ENCOUNTER — Encounter: Payer: Self-pay | Admitting: Internal Medicine

## 2016-10-06 VITALS — BP 188/75 | HR 81 | Temp 97.8°F | Resp 17 | Ht 62.0 in | Wt 214.2 lb

## 2016-10-06 DIAGNOSIS — C3412 Malignant neoplasm of upper lobe, left bronchus or lung: Secondary | ICD-10-CM

## 2016-10-06 DIAGNOSIS — Z902 Acquired absence of lung [part of]: Secondary | ICD-10-CM

## 2016-10-06 DIAGNOSIS — I10 Essential (primary) hypertension: Secondary | ICD-10-CM | POA: Diagnosis not present

## 2016-10-06 DIAGNOSIS — J449 Chronic obstructive pulmonary disease, unspecified: Secondary | ICD-10-CM | POA: Diagnosis not present

## 2016-10-06 NOTE — Telephone Encounter (Signed)
Called patient regarding November appointment

## 2016-10-06 NOTE — Progress Notes (Signed)
[place] [phone]   [phone]  OFFICE PROGRESS NOTE  Nolene Ebbs, MD [place] [address]  DIAGNOSIS: Lung cancer  Staging form: Lung, AJCC 7th Edition  Clinical: Stage IIA (T2b, N0, M0) - Signed by [name], MD on 05/31/2014  PRIOR THERAPY:  1) Status post left upper lobectomy with lymph node dissection under the care of [name] on 05/04/2014. 2) Adjuvant chemotherapy with carboplatin for an AUC of 5 and paclitaxel 175 mg/m given every 3 weeks with Neulasta support. A total of 4 cycles of adjuvant chemotherapy are planned. Status post 4 cycles. Last dose was given 08/26/2014.  CURRENT THERAPY: Observation.  INTERVAL HISTORY: [name] [name] 83 y.o. female returns to the clinic today for follow-up visit. The patient is feeling fine today with no specific complaints except for chronic back pain. She also has shortness of breath with exertion and she is scheduled to see Dr. Ashok Cordia later today. She denied having any chest pain, cough or hemoptysis. She denied having any fever or chills. She gained a lot of weight recently. She denied having any headache or visual changes. She had repeat CT scan of the chest performed recently and she is here for evaluation and discussion of her scan results.   MEDICAL HISTORY: Past Medical History:  Diagnosis Date  . Anxiety   . Arthritis   . Cancer ([place])   . COPD (chronic obstructive pulmonary disease) ([place])   . Diabetes mellitus without complication ([place])   . HTN (hypertension)   . Palpitations   . Primary cancer of left upper lobe of lung ([place]) 05/23/2014   Stage IIA- T2b,N0- thoracoscopic left upper lobectomy 05/04/14  . SOB (shortness of breath)     ALLERGIES:  is allergic to darvon [propoxyphene hcl] and lipitor [atorvastatin].  MEDICATIONS:  Current Outpatient Prescriptions  Medication Sig Dispense Refill  . ADVAIR DISKUS 250-50 MCG/DOSE AEPB Inhale 1  puff into the lungs 2 (two) times daily.    Marland Kitchen ALPRAZolam (XANAX) 0.5 MG tablet Take 0.5 mg by mouth 3 (three) times daily as needed for anxiety. Reported on 03/28/2015    . aspirin 81 MG tablet Take 81 mg by mouth daily.    Marland Kitchen esomeprazole (NEXIUM) 40 MG capsule Take 40 mg by mouth daily.    . fluorometholone (FML) 0.1 % ophthalmic suspension Place 1 drop into both eyes daily.    . metoprolol succinate (TOPROL-XL) 25 MG 24 hr tablet Take 1 tablet by mouth daily.    . metoprolol tartrate (LOPRESSOR) 25 MG tablet Take 25 mg by mouth 2 (two) times daily.    . multivitamin-iron-minerals-folic acid (CENTRUM) chewable tablet Chew 1 tablet by mouth daily.    . traMADol (ULTRAM) 50 MG tablet Take 50 mg by mouth 2 (two) times daily as needed.    . valsartan-hydrochlorothiazide (DIOVAN-HCT) 160-25 MG tablet Take 1 tablet by mouth daily.    . VENTOLIN HFA 108 (90 BASE) MCG/ACT inhaler      No current facility-administered medications for this visit.     SURGICAL HISTORY:  Past Surgical History:  Procedure Laterality Date  . LEAD REMOVAL Left 05/04/2014   Procedure: CRYO INTERCOSTAL NERVE BLOCK;  Surgeon: [name], MD;  Location: [facility];  Service: Thoracic;  Laterality: Left;  . LUNG LOBECTOMY  05/04/14   left upper lobectomy  . NODE DISSECTION Left 05/04/2014   Procedure: NODE DISSECTION;  Surgeon: [name], MD;  Location: [facility];  Service: Thoracic;  Laterality: Left;  Marland Kitchen VAGINAL HYSTERECTOMY  1966  . VIDEO ASSISTED THORACOSCOPY (VATS)/ LOBECTOMY Left 05/04/2014   Procedure: LEFT VIDEO ASSISTED THORACOSCOPY (VATS)/LEFT UPPER LOBECTOMY;  Surgeon: [name], MD;  Location: [facility];  Service: Thoracic;  Laterality: Left;    REVIEW OF SYSTEMS:  A comprehensive review of systems was negative except for: Respiratory: positive for dyspnea on exertion   PHYSICAL EXAMINATION: General appearance: alert, cooperative and no distress Head: Normocephalic, without obvious abnormality,  atraumatic Neck: no adenopathy, no JVD, supple, symmetrical, trachea midline and thyroid not enlarged, symmetric, no tenderness/mass/nodules Lymph nodes: Cervical, supraclavicular, and axillary nodes normal. Resp: clear to auscultation bilaterally Back: symmetric, no curvature. ROM normal. No CVA tenderness. Cardio: regular rate and rhythm, S1, S2 normal, no murmur, click, rub or gallop GI: soft, non-tender; bowel sounds normal; no masses,  no organomegaly Extremities: extremities normal, atraumatic, no cyanosis or edema  ECOG PERFORMANCE STATUS: 1 - Symptomatic but completely ambulatory  Blood pressure (!) 188/75, pulse 81, temperature 97.8 F (36.6 C), temperature source Oral, resp. rate 17, height 5\' 2"  (1.575 m), weight 214 lb 3.2 oz (97.2 kg), SpO2 94 %.  LABORATORY DATA: Lab Results  Component Value Date   WBC 7.9 10/03/2016   HGB 12.3 10/03/2016   HCT 37.5 10/03/2016   MCV 86.4 10/03/2016   PLT 285 10/03/2016      Chemistry      Component Value Date/Time   NA 143 10/03/2016 1056   K 4.0 10/03/2016 1056   CL 110 07/25/2014 1318   CO2 23 10/03/2016 1056   BUN 17.9 10/03/2016 1056   CREATININE 1.1 10/03/2016 1056      Component Value Date/Time   CALCIUM 9.8 10/03/2016 1056   ALKPHOS 81 10/03/2016 1056   AST 18 10/03/2016 1056   ALT 15 10/03/2016 1056   BILITOT 0.30 10/03/2016 1056       RADIOGRAPHIC STUDIES: Ct Chest W Contrast  Result Date: 10/03/2016 CLINICAL DATA:  Stage IIA left upper lobe non-small cell lung cancer status post left upper lobectomy 05/04/2014 and adjuvant chemotherapy completed 08/26/2014. Patient presents for routine surveillance on interval observation. EXAM: CT CHEST WITH CONTRAST TECHNIQUE: Multidetector CT imaging of the chest was performed during intravenous contrast administration. CONTRAST:  81mL ISOVUE-300 IOPAMIDOL (ISOVUE-300) INJECTION 61% COMPARISON:  03/31/2016 chest CT. FINDINGS: Cardiovascular: Normal heart size. No significant  pericardial fluid/thickening. Left anterior descending and right coronary atherosclerosis. Atherosclerotic nonaneurysmal thoracic aorta. Normal caliber pulmonary arteries. No central pulmonary emboli. Mediastinum/Nodes: Stable tiny subcentimeter hypodense left thyroid lobe nodules. Unremarkable esophagus. No axillary adenopathy. Mild right hilar adenopathy measuring up to 1.0 cm short axis (series 2/ image 68) is stable. Otherwise no pathologically enlarged mediastinal or hilar nodes. Lungs/Pleura: Status post left upper lobectomy. No pneumothorax. Stable trace dependent right pleural effusion. No left pleural effusion. Moderate centrilobular and paraseptal emphysema with mild diffuse bronchial wall thickening. Posterior right lower lobe 1.2 x 1.0 cm subsolid pulmonary nodule (series 5/ image 85) appears slightly increased in size from 1.0 x 0.9 cm and increased in density since 03/31/2016 and increased from 0.5 x 0.5 cm on 09/26/2015. Irregular 6 mm posterior right upper lobe pulmonary nodule (series 5/ image 32) and scattered posterior right lower lobe pulmonary nodules measuring up to 4 mm (series 5/ image 56) are all stable since at least 09/26/2015 chest CT and probably benign. No acute consolidative airspace disease, lung masses or additional significant pulmonary nodules. Stable parenchymal banding in the lower lobes compatible with  scarring. Upper abdomen: Small hiatal hernia. Stable appearance of the adrenal glands without discrete adrenal nodules. Partially visualize simple appearing upper right renal cysts, largest 1.6 cm in the medial upper right kidney. subcentimeter hypodense renal cortical lesions in the anterior upper left kidney are too small to characterize and stable. Colonic diverticulosis. Musculoskeletal: No aggressive appearing focal osseous lesions. Marked thoracic spondylosis. IMPRESSION: 1. No evidence of local tumor recurrence in the left hemithorax status post left upper lobectomy. 2.  Posterior right lower lobe 1.2 cm subsolid pulmonary nodule is mildly increased in size and increased in density. Metachronous primary bronchogenic carcinoma or metastasis cannot be excluded. Consider further characterization with PET-CT and/or continued close surveillance chest CT. 3. Additional subcentimeter pulmonary nodules scattered in the right lung are stable for 1 year and probably benign. 4. Stable nonspecific mild right hilar adenopathy. 5. Two-vessel coronary atherosclerosis. Aortic Atherosclerosis (ICD10-I70.0) and Emphysema (ICD10-J43.9). Electronically Signed   By: Ilona Sorrel M.D.   On: 10/03/2016 14:46    ASSESSMENT AND PLAN:  This is a very pleasant 83 years old African-American female with a stage IIA non-small cell lung cancer status post left upper lobectomy with lymph node dissection followed by 4 cycles of adjuvant systemic chemotherapy with carboplatin and paclitaxel The patient has been on observation since July 2016. The patient had repeat CT scan of the chest performed recently. Her scan showed no clear evidence for disease progression except for enlarging right lower lobe 1.2 cm sub-solid pulmonary nodule. I personally and independently reviewed the scan images and discuss the results with the patient and her son. I will the patient options for close monitoring and repeat CT scan of the chest in 3 months for further evaluation of this nodule versus proceeding with a PET scan. The patient would like to wait for the next 3 months for repeat scan. I will see her for follow-up visit at that time. For hypertension she was advised to take her blood pressure medication as prescribed by her primary care physician and treated consult with him regarding adjustment of her medication. She was advised to call immediately if she has any concerning symptoms in the interval. The patient voices understanding of current disease status and treatment options and is in agreement with the current  care plan. All questions were answered. The patient knows to call the clinic with any problems, questions or concerns. We can certainly see the patient much sooner if necessary. I spent 10 minutes counseling the patient face to face. The total time spent in the appointment was 15 minutes.  Disclaimer: This note was dictated with voice recognition software. Similar sounding words can inadvertently be transcribed and may not be corrected upon review.

## 2016-11-04 NOTE — Progress Notes (Deleted)
Subjective:    Patient ID: [name] [name], female    DOB: 09-Apr-1933, 83 y.o.   MRN: [id number]  C.C.:  Follow-up for Mild COPD w/ Emphysema & NSCLC.   HPI Previously followed by Dr. Melvyn Novas.  Mild COPD with emphysema: Notably patient has atypical predominant emphysematous changes.  NSCLC: Follows with medical oncology. Status post left upper lobectomy and lymph node dissection 05/04/14 by [name]. Stage IIA (T2b, N0, M0) treated with adjuvant chemotherapy with carboplatin and paclitaxel. Last dose of chemotherapy given 08/26/14. With enlargement in right lower lobe nodule oncology plans for repeat CT imaging in November.   Review of Systems   Allergies  Allergen Reactions  . Darvon [Propoxyphene Hcl]   . Lipitor [Atorvastatin]     myalgia's    Current Outpatient Prescriptions on File Prior to Visit  Medication Sig Dispense Refill  . ADVAIR DISKUS 250-50 MCG/DOSE AEPB Inhale 1 puff into the lungs 2 (two) times daily.    Marland Kitchen ALPRAZolam (XANAX) 0.5 MG tablet Take 0.5 mg by mouth 3 (three) times daily as needed for anxiety. Reported on 03/28/2015    . aspirin 81 MG tablet Take 81 mg by mouth daily.    Marland Kitchen esomeprazole (NEXIUM) 40 MG capsule Take 40 mg by mouth daily.    . fluorometholone (FML) 0.1 % ophthalmic suspension Place 1 drop into both eyes daily.    . metoprolol succinate (TOPROL-XL) 25 MG 24 hr tablet Take 1 tablet by mouth daily.    . metoprolol tartrate (LOPRESSOR) 25 MG tablet Take 25 mg by mouth 2 (two) times daily.    . multivitamin-iron-minerals-folic acid (CENTRUM) chewable tablet Chew 1 tablet by mouth daily.    . traMADol (ULTRAM) 50 MG tablet Take 50 mg by mouth 2 (two) times daily as needed.    . valsartan-hydrochlorothiazide (DIOVAN-HCT) 160-25 MG tablet Take 1 tablet by mouth daily.    . VENTOLIN HFA 108 (90 BASE) MCG/ACT inhaler      No current facility-administered medications on file prior to visit.     Past Medical History:  Diagnosis Date  . Anxiety    . Arthritis   . Cancer ([place])   . COPD (chronic obstructive pulmonary disease) ([place])   . Diabetes mellitus without complication ([place])   . HTN (hypertension)   . Palpitations   . Primary cancer of left upper lobe of lung ([place]) 05/23/2014   Stage IIA- T2b,N0- thoracoscopic left upper lobectomy 05/04/14  . SOB (shortness of breath)     Past Surgical History:  Procedure Laterality Date  . LEAD REMOVAL Left 05/04/2014   Procedure: CRYO INTERCOSTAL NERVE BLOCK;  Surgeon: [name], MD;  Location: [facility];  Service: Thoracic;  Laterality: Left;  . LUNG LOBECTOMY  05/04/14   left upper lobectomy  . NODE DISSECTION Left 05/04/2014   Procedure: NODE DISSECTION;  Surgeon: [name], MD;  Location: [facility];  Service: Thoracic;  Laterality: Left;  Marland Kitchen VAGINAL HYSTERECTOMY  1966  . VIDEO ASSISTED THORACOSCOPY (VATS)/ LOBECTOMY Left 05/04/2014   Procedure: LEFT VIDEO ASSISTED THORACOSCOPY (VATS)/LEFT UPPER LOBECTOMY;  Surgeon: [name], MD;  Location: [facility];  Service: Thoracic;  Laterality: Left;    Family History  Problem Relation Age of Onset  . Cervical cancer Mother   . Prostate cancer Father   . Stroke Maternal Grandmother   . Angina Maternal Grandfather   . Prostate cancer Paternal Uncle     Social History   Social History  . Marital status: Widowed  Spouse name: N/A  . Number of children: N/A  . Years of education: N/A   Occupational History  . retired    Social History Main Topics  . Smoking status: Former Smoker    Packs/day: 0.25    Years: 69.00    Types: Cigarettes    Quit date: 05/03/2014  . Smokeless tobacco: Former Systems developer     Comment: Quit 05/03/14  . Alcohol use No  . Drug use: No  . Sexual activity: Not on file   Other Topics Concern  . Not on file   Social History Narrative  . No narrative on file      Objective:   Physical Exam There were no vitals taken for this visit. General:  Awake. Alert. No acute distress. Sitting  watching TV. Family at bedside.  Integument:  Warm & dry. No rash on exposed skin. No bruising. Extremities:  No cyanosis or clubbing.  HEENT:  Moist mucus membranes. No oral ulcers. No scleral injection or icterus. Endotracheal tube in place. PERRL. Cardiovascular:  Regular rate. No edema. No appreciable JVD.  Pulmonary:  Good aeration & clear to auscultation bilaterally. Symmetric chest wall expansion. No accessory muscle use. Abdomen: Soft. Normal bowel sounds. Nondistended. Grossly nontender. Musculoskeletal:  Normal bulk and tone. Hand grip strength 5/5 bilaterally. No joint deformity or effusion appreciated.  PFT 03/22/14: FVC 2.77 L (132%) FEV1 1.57 L (102%) FEV1/FVC 0.57 FEF 25-75 0.72 L (47%)   IMAGING CT CHEST W/ CONTRAST 10/03/16 (personally reviewed by me):  Status post left upper lobectomy. 1.2 cm partially groundglass nodule in right lower lobe noted which appears more solid compared with imaging performed previously and shows signs of some progression. Subcentimeter nodules in right lung also noted without obvious enlargement. No pathologic mediastinal adenopathy. Pleural thickening bilaterally but most notably on the right. No obvious pleural effusion or pericardial effusion. Atypical predominant emphysematous changes.  CT CHEST W/ CONTRAST 03/31/16 (personally reviewed by me):  Volume loss on the left with apical predominant emphysematous changes. Right hilar lymph node appears enlarged. No pathologic mediastinal adenopathy however. No pleural effusion or thickening. No pericardial effusion. Slight nodular density within right upper lobe appreciated.  CARDIAC TTE (01/21/12): LV with mild LVH & EF 55-60%. LA & RA normal in size. RV normal in size and function. No aortic stenosis or regurgitation. Aortic root normal in size. Trivial mitral regurgitation without stenosis. No significant pulmonic regurgitation. Trivial tricuspid regurgitation. No pericardial effusion.  PATHOLOGY Left  Upper Lobe Resection & Lymph Node Dissection (05/04/14): Invasive moderately to poorly differentiated squamous cell carcinoma spanning 6 cm in greatest dimension. Margins negative. Focal superficial visceropleural invasion identified. Levels 10L, 11L, 7, & 5 lymph nodes biopsied without malignancy or atypia. pT2b, pN0.  LABS 05/02/14 ABG on RA:  7.42 / 35 / 69 / 93%    Assessment & Plan:  83 y.o.   1. Mild COPD with emphysema: 2. Multiple pulmonary nodules: 3. NSCLC: 4. Health maintenance: Status post Pneumovax February 2015. 5. Follow-up: Return to clinic in   [place]. Ashok Cordia, M.D. [facility] Pager:  [phone] After 3pm or if no response, call [phone] 6:40 PM 11/04/16

## 2016-11-05 ENCOUNTER — Institutional Professional Consult (permissible substitution): Payer: Medicare HMO | Admitting: Pulmonary Disease

## 2016-11-17 ENCOUNTER — Encounter: Payer: Self-pay | Admitting: Pulmonary Disease

## 2016-11-17 ENCOUNTER — Other Ambulatory Visit: Payer: Medicare HMO

## 2016-11-17 ENCOUNTER — Ambulatory Visit (INDEPENDENT_AMBULATORY_CARE_PROVIDER_SITE_OTHER): Payer: Medicare HMO | Admitting: Pulmonary Disease

## 2016-11-17 VITALS — BP 130/70 | HR 86 | Ht 62.5 in | Wt 205.4 lb

## 2016-11-17 DIAGNOSIS — J301 Allergic rhinitis due to pollen: Secondary | ICD-10-CM

## 2016-11-17 DIAGNOSIS — C3492 Malignant neoplasm of unspecified part of left bronchus or lung: Secondary | ICD-10-CM

## 2016-11-17 DIAGNOSIS — J449 Chronic obstructive pulmonary disease, unspecified: Secondary | ICD-10-CM

## 2016-11-17 DIAGNOSIS — J439 Emphysema, unspecified: Secondary | ICD-10-CM | POA: Diagnosis not present

## 2016-11-17 DIAGNOSIS — J309 Allergic rhinitis, unspecified: Secondary | ICD-10-CM | POA: Insufficient documentation

## 2016-11-17 NOTE — Patient Instructions (Addendum)
   Use the Trelegy Ellipta inhaler we are giving you today in place of your Advair inhaler. Do 1 inhalation off the Trelegy once daily.  Remember to remove any dentures or partials you have before you use your inhaler. Remember to brush your teeth & tongue after you use your inhaler as well as rinse, gargle & spit to keep from getting thrush in your mouth or on your tongue (a white film).   Call my office for a prescription for the Trelegy if you feel it is helping your breathing.  We will arrange for a breathing & walking test before your next appointment.  We will review your test results at your follow-up appointment.  I will see you back in 4 weeks or sooner if needed.  TESTS ORDERED: 1. Full PFTs on or before next appointment 2. 6MWT on room air on or before next appointment 3. Serum RAST panel & alpha-1 antitrypsin phenotype

## 2016-11-17 NOTE — Progress Notes (Signed)
Subjective:    Patient ID: [name] [name], female    DOB: 1933/08/23, 83 y.o.   MRN: [id number]  C.C.:  Follow-up for Mild COPD w/ Emphysema & NSCLC.   HPI Previously followed by Dr. Melvyn Novas. She reports recently she did have a URI and after a few days she was coughing up a "thick mucus with streaks of blood". She reports she was treated with an antibiotic and her cough is improving. No further hemoptysis.   Mild COPD with emphysema: Notably patient has atypical predominant emphysematous changes. She reports now she is only producing a clear mucus. She does notice significant dyspnea with exertion & bending over. She does wheeze frequently. She does feel her dyspnea is progressively worsening. She reports she has been on Advair for years but doesn't feel it helps. She also has an Albuterol rescue inhaler without significant relief in her symptoms. She reports previously when she was smoking she had frequent yearly treatment for bronchitis.   NSCLC: Follows with medical oncology. Status post left upper lobectomy and lymph node dissection 05/04/14 by [name]. Stage IIA (T2b, N0, M0) treated with adjuvant chemotherapy with carboplatin and paclitaxel. Last dose of chemotherapy given 08/26/14. With enlargement in right lower lobe nodule oncology plans for repeat CT imaging in November.   Review of Systems No fever, chills, or sweats recently. She reports some tightness in her chest with her dyspnea. No new chest pain that she has noted. She does report significant fatigue. She reports some occasional sinus congestion & drainage that she has noticed more over the last year.   Allergies  Allergen Reactions  . Darvon [Propoxyphene Hcl]   . Lipitor [Atorvastatin]     myalgia's    Current Outpatient Prescriptions on File Prior to Visit  Medication Sig Dispense Refill  . ADVAIR DISKUS 250-50 MCG/DOSE AEPB Inhale 1 puff into the lungs 2 (two) times daily.    Marland Kitchen ALPRAZolam (XANAX) 0.5 MG tablet Take  0.5 mg by mouth 3 (three) times daily as needed for anxiety. Reported on 03/28/2015    . aspirin 81 MG tablet Take 81 mg by mouth daily.    Marland Kitchen esomeprazole (NEXIUM) 40 MG capsule Take 40 mg by mouth daily.    . fluorometholone (FML) 0.1 % ophthalmic suspension Place 1 drop into both eyes daily.    . metoprolol succinate (TOPROL-XL) 25 MG 24 hr tablet Take 1 tablet by mouth daily.    . metoprolol tartrate (LOPRESSOR) 25 MG tablet Take 25 mg by mouth 2 (two) times daily.    . multivitamin-iron-minerals-folic acid (CENTRUM) chewable tablet Chew 1 tablet by mouth daily.    . traMADol (ULTRAM) 50 MG tablet Take 50 mg by mouth 2 (two) times daily as needed.    . valsartan-hydrochlorothiazide (DIOVAN-HCT) 160-25 MG tablet Take 1 tablet by mouth daily.    . VENTOLIN HFA 108 (90 BASE) MCG/ACT inhaler      No current facility-administered medications on file prior to visit.     Past Medical History:  Diagnosis Date  . Allergic rhinitis   . Anxiety   . Arthritis   . Cancer ([place])   . COPD (chronic obstructive pulmonary disease) ([place])   . Diabetes mellitus without complication ([place])   . Emphysema of lung ([place])   . History of positive PPD, untreated   . HTN (hypertension)   . Palpitations   . Primary cancer of left upper lobe of lung ([place]) 05/23/2014   Stage IIA- T2b,N0- thoracoscopic left upper lobectomy 05/04/14  .  SOB (shortness of breath)     Past Surgical History:  Procedure Laterality Date  . LEAD REMOVAL Left 05/04/2014   Procedure: CRYO INTERCOSTAL NERVE BLOCK;  Surgeon: [name], MD;  Location: [facility];  Service: Thoracic;  Laterality: Left;  . LUNG LOBECTOMY  05/04/14   left upper lobectomy  . NODE DISSECTION Left 05/04/2014   Procedure: NODE DISSECTION;  Surgeon: [name], MD;  Location: [facility];  Service: Thoracic;  Laterality: Left;  Marland Kitchen VAGINAL HYSTERECTOMY  1966  . VIDEO ASSISTED THORACOSCOPY (VATS)/ LOBECTOMY Left 05/04/2014   Procedure: LEFT VIDEO ASSISTED  THORACOSCOPY (VATS)/LEFT UPPER LOBECTOMY;  Surgeon: [name], MD;  Location: [facility];  Service: Thoracic;  Laterality: Left;    Family History  Problem Relation Age of Onset  . Cervical cancer Mother   . Prostate cancer Father   . Heart disease Father   . Stroke Maternal Grandmother   . Angina Maternal Grandfather   . Prostate cancer Paternal Uncle   . Lung cancer Brother   . Lung disease Neg Hx     Social History   Social History  . Marital status: Widowed    Spouse name: N/A  . Number of children: N/A  . Years of education: N/A   Occupational History  . retired    Social History Main Topics  . Smoking status: Former Smoker    Packs/day: 0.75    Years: 70.00    Types: Cigarettes    Start date: 01/02/1945    Quit date: 05/03/2014  . Smokeless tobacco: Former Systems developer     Comment: Quit 05/03/14  . Alcohol use No  . Drug use: No  . Sexual activity: Not Asked   Other Topics Concern  . None   Social History Narrative   Why Pulmonary (11/17/16):   Originally from [facility]. Previously lived in [place]. 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 No treatment but did get monitored with CXR. Currently has a Proofreader for 10 years and a dog. No mold exposure. She lives alone in an apartment. No hobbies currently.       Objective:   Physical Exam BP 130/70 (BP Location: Left Arm, Cuff Size: Normal)   Pulse 86   Ht 5' 2.5" (1.588 m)   Wt 205 lb 6 oz (93.2 kg)   SpO2 90%   BMI 36.96 kg/m  General:  Awake. Alert. No acute distress. Obese. Accompanied by his son-in-law today. Integument:  Warm & dry. No rash on exposed skin. No bruising. Extremities:  No cyanosis or clubbing.  HEENT:  Moist mucus membranes. No oral ulcers. No scleral injection or icterus. Moderate bilateral nasal turbinate swelling with pale mucosa Cardiovascular:  Regular rate. No edema. No appreciable JVD.  Pulmonary:  Good aeration & clear to  auscultation bilaterally. Normal work of breathing on room air. Abdomen: Soft. Normal bowel sounds. Protuberant. Musculoskeletal:  Normal bulk and tone. No joint deformity or effusion appreciated.  PFT 03/22/14: FVC 2.77 L (132%) FEV1 1.57 L (102%) FEV1/FVC 0.57 FEF 25-75 0.72 L (47%)   IMAGING CT CHEST W/ CONTRAST 10/03/16 (personally reviewed by me):  Status post left upper lobectomy. 1.2 cm partially groundglass nodule in right lower lobe noted which appears more solid compared with imaging performed previously and shows signs of some progression. Subcentimeter nodules in right lung also noted without obvious enlargement. No pathologic mediastinal adenopathy. Pleural thickening bilaterally but most notably on the  right. No obvious pleural effusion or pericardial effusion. Atypical predominant emphysematous changes.  CT CHEST W/ CONTRAST 03/31/16 (personally reviewed by me):  Volume loss on the left with apical predominant emphysematous changes. Right hilar lymph node appears enlarged. No pathologic mediastinal adenopathy however. No pleural effusion or thickening. No pericardial effusion. Slight nodular density within right upper lobe appreciated.  CARDIAC TTE (01/21/12): LV with mild LVH & EF 55-60%. LA & RA normal in size. RV normal in size and function. No aortic stenosis or regurgitation. Aortic root normal in size. Trivial mitral regurgitation without stenosis. No significant pulmonic regurgitation. Trivial tricuspid regurgitation. No pericardial effusion.  PATHOLOGY Left Upper Lobe Resection & Lymph Node Dissection (05/04/14): Invasive moderately to poorly differentiated squamous cell carcinoma spanning 6 cm in greatest dimension. Margins negative. Focal superficial visceropleural invasion identified. Levels 10L, 11L, 7, & 5 lymph nodes biopsied without malignancy or atypia. pT2b, pN0.  LABS 05/02/14 ABG on RA:  7.42 / 35 / 69 / 93%    Assessment & Plan:  83 y.o. female with mild COPD  with emphysema based on my review of her spirometry above showing mild airway obstruction and imaging showing atypical predominant emphysema. Patient does have a known history of non-small cell lung cancer status post left upper lobe resection in 2016 as well as multiple pulmonary nodules. I reviewed the patient's chest CT scan with her today and agree with continuing surveillance utilizing chest CT imaging. She seems to be recovering from her recent upper respiratory tract infection. She does seem to have physical exam findings consistent with allergic rhinitis but also has symptoms that would correlate to her significant underlying pulmonary emphysema. She does continue to abstain from tobacco use. I believe she may benefit from the addition of a LAMA  to her regimen. I cautioned the patient against possible urinary retention as well as worsening closed-angle glaucoma. I instructed her to contact my office for prescription if her new inhaler proved to be more effective. She will contact our office if she has any further questions or concerns before her next appointment.   1. Mild COPD with emphysema: Trying patient on Trelegy in place of Advair. She will contact my office for prescription if this is effective. Checking full pulmonary function testing & 6 minute walk test on room air on or before next appointment. Screening for alpha-1 antitrypsin deficiency. 2. Multiple pulmonary nodules: Deferring monitoring to oncology. Already plan for repeat CT scan in November. 3. NSCLC: Continuing follow-up with medical oncology. No further testing at this time. 4. Non-seasonal allergic rhinitis: Checking RAST panel. Continuing on antihistamine therapy & nasal spray as prescribed by her PCP. 5. Health maintenance: Status post Pneumovax February 2015. Administering Prevnar vaccine today. 6. Follow-up: Return to clinic in 4 weeks or sooner if needed.  Sonia Baller Ashok Cordia, M.D. [facility] Pager:   [phone] After 3pm or if no response, call [phone] 10:22 AM 11/17/16

## 2016-11-20 LAB — RESPIRATORY ALLERGY PROFILE REGION II ~~LOC~~
Allergen, A. alternata, m6: <0.1 kU/L
Allergen, Cedar tree, t12: <0.1 kU/L
Allergen, Comm Silver Birch, t9: <0.1 kU/L
Allergen, D pternoyssinus,d7: 0.31 kU/L — ABNORMAL HIGH
Allergen, Mulberry, t76: <0.1 kU/L
Allergen, P. notatum, m1: <0.1 kU/L
Aspergillus fumigatus, m3: <0.1 kU/L
CLADOSPORIUM HERBARUM (M2) IGE: <0.1 kU/L
CLASS: 0
CLASS: 0
CLASS: 0
CLASS: 0
CLASS: 0
CLASS: 0
CLASS: 0
CLASS: 0
CLASS: 0
CLASS: 0
CLASS: 0
CLASS: 0
COMMON RAGWEED (SHORT) (W1) IGE: <0.1 kU/L
Cat Dander: <0.1 kU/L
Class: 0
Class: 0
Class: 0
Class: 0
Class: 0
Class: 0
Class: 0
Class: 0
Class: 0
Class: 0
Class: 0
Class: 0
D. farinae: 0.18 kU/L — ABNORMAL HIGH
DOG DANDER: 0.13 kU/L — AB
Elm IgE: <0.1 kU/L
IgE (Immunoglobulin E), Serum: 173 kU/L — ABNORMAL HIGH (ref <=114)
Johnson Grass: <0.1 kU/L
Pecan/Hickory Tree IgE: <0.1 kU/L
Rough Pigweed  IgE: <0.1 kU/L

## 2016-11-20 LAB — ALPHA-1 ANTITRYPSIN PHENOTYPE: A1 ANTITRYPSIN SER: 217 mg/dL — AB (ref 83–199)

## 2016-11-20 LAB — INTERPRETATION:

## 2016-11-26 ENCOUNTER — Ambulatory Visit (INDEPENDENT_AMBULATORY_CARE_PROVIDER_SITE_OTHER): Payer: Medicare HMO | Admitting: Pulmonary Disease

## 2016-11-26 DIAGNOSIS — J439 Emphysema, unspecified: Secondary | ICD-10-CM

## 2016-11-26 DIAGNOSIS — J449 Chronic obstructive pulmonary disease, unspecified: Secondary | ICD-10-CM | POA: Diagnosis not present

## 2016-11-26 DIAGNOSIS — J301 Allergic rhinitis due to pollen: Secondary | ICD-10-CM

## 2016-11-26 LAB — PULMONARY FUNCTION TEST
DL/VA % pred: 52 %
DL/VA: 2.34 ml/min/mmHg/L
DLCO COR % PRED: 34 %
DLCO UNC % PRED: 32 %
DLCO UNC: 6.82 ml/min/mmHg
DLCO cor: 7.23 ml/min/mmHg
FEF 25-75 POST: 0.9 L/s
FEF 25-75 PRE: 0.8 L/s
FEF2575-%Change-Post: 12 %
FEF2575-%Pred-Post: 80 %
FEF2575-%Pred-Pre: 71 %
FEV1-%Change-Post: 1 %
FEV1-%Pred-Post: 116 %
FEV1-%Pred-Pre: 114 %
FEV1-PRE: 1.51 L
FEV1-Post: 1.54 L
FEV1FVC-%Change-Post: 0 %
FEV1FVC-%PRED-PRE: 83 %
FEV6-%CHANGE-POST: 2 %
FEV6-%Pred-Post: 149 %
FEV6-%Pred-Pre: 146 %
FEV6-POST: 2.44 L
FEV6-Pre: 2.39 L
FEV6FVC-%CHANGE-POST: 0 %
FEV6FVC-%PRED-POST: 104 %
FEV6FVC-%Pred-Pre: 104 %
FVC-%CHANGE-POST: 1 %
FVC-%Pred-Post: 142 %
FVC-%Pred-Pre: 140 %
FVC-Post: 2.46 L
FVC-Pre: 2.42 L
PRE FEV1/FVC RATIO: 62 %
Post FEV1/FVC ratio: 63 %
Post FEV6/FVC ratio: 99 %
Pre FEV6/FVC Ratio: 99 %
RV % pred: 47 %
RV: 1.07 L
TLC % pred: 74 %
TLC: 3.48 L

## 2016-11-26 NOTE — Patient Instructions (Signed)
PFT done today. 

## 2016-12-23 ENCOUNTER — Ambulatory Visit: Payer: Medicare HMO

## 2016-12-23 ENCOUNTER — Ambulatory Visit: Payer: Medicare HMO | Admitting: Pulmonary Disease

## 2016-12-24 ENCOUNTER — Ambulatory Visit: Payer: Medicare HMO | Admitting: Pulmonary Disease

## 2016-12-24 ENCOUNTER — Ambulatory Visit: Payer: Medicare HMO

## 2016-12-24 NOTE — Progress Notes (Deleted)
Subjective:    Patient ID: [name] [name], female    DOB: [birth year], 83 y.o.   MRN: [id number]  C.C.:  Follow-up for Mild COPD w/ Emphysema, Nonseasonal Allergic Rhinitis, & NSCLC.   HPI Previously followed by Dr. Melvyn Novas.  Mild COPD with emphysema: Patient given sample of Trelegy to try in place of Advair at last appointment.  Non-seasonal allergic rhinitis: Previously prescribed antihistamine and intranasal medications by her PCP.   NSCLC: Following with medical oncology. Status post left upper lobe lobectomy 05/04/14 by [name]. Stage IIA (T2b, N0, M0) treated with adjuvant chemotherapy with carboplatin and paclitaxel. Last dose of chemotherapy given 08/26/14. Follow-up imaging as per Medical Oncology.  Review of Systems ***  Allergies  Allergen Reactions  . Darvon [Propoxyphene Hcl]   . Lipitor [Atorvastatin]     myalgia's    Current Outpatient Medications on File Prior to Visit  Medication Sig Dispense Refill  . ADVAIR DISKUS 250-50 MCG/DOSE AEPB Inhale 1 puff into the lungs 2 (two) times daily.    Marland Kitchen ALPRAZolam (XANAX) 0.5 MG tablet Take 0.5 mg by mouth 3 (three) times daily as needed for anxiety. Reported on 03/28/2015    . aspirin 81 MG tablet Take 81 mg by mouth daily.    Marland Kitchen esomeprazole (NEXIUM) 40 MG capsule Take 40 mg by mouth daily.    . fluorometholone (FML) 0.1 % ophthalmic suspension Place 1 drop into both eyes daily.    . metoprolol succinate (TOPROL-XL) 25 MG 24 hr tablet Take 1 tablet by mouth daily.    . metoprolol tartrate (LOPRESSOR) 25 MG tablet Take 25 mg by mouth 2 (two) times daily.    . multivitamin-iron-minerals-folic acid (CENTRUM) chewable tablet Chew 1 tablet by mouth daily.    . traMADol (ULTRAM) 50 MG tablet Take 50 mg by mouth 2 (two) times daily as needed.    . valsartan-hydrochlorothiazide (DIOVAN-HCT) 160-25 MG tablet Take 1 tablet by mouth daily.    . VENTOLIN HFA 108 (90 BASE) MCG/ACT inhaler      No current facility-administered  medications on file prior to visit.     Past Medical History:  Diagnosis Date  . Allergic rhinitis   . Anxiety   . Arthritis   . Cancer ([place])   . COPD (chronic obstructive pulmonary disease) ([place])   . Diabetes mellitus without complication ([place])   . Emphysema of lung ([place])   . History of positive PPD, untreated   . HTN (hypertension)   . Palpitations   . Primary cancer of left upper lobe of lung ([place]) 05/23/2014   Stage IIA- T2b,N0- thoracoscopic left upper lobectomy 05/04/14  . SOB (shortness of breath)     Past Surgical History:  Procedure Laterality Date  . LUNG LOBECTOMY  05/04/14   left upper lobectomy  . VAGINAL HYSTERECTOMY  1966    Family History  Problem Relation Age of Onset  . Cervical cancer Mother   . Prostate cancer Father   . Heart disease Father   . Stroke Maternal Grandmother   . Angina Maternal Grandfather   . Prostate cancer Paternal Uncle   . Lung cancer Brother   . Lung disease Neg Hx     Social History   Socioeconomic History  . Marital status: Widowed    Spouse name: Not on file  . Number of children: Not on file  . Years of education: Not on file  . Highest education level: Not on file  Social Needs  . Financial resource strain: Not  on file  . Food insecurity - worry: Not on file  . Food insecurity - inability: Not on file  . Transportation needs - medical: Not on file  . Transportation needs - non-medical: Not on file  Occupational History  . Occupation: retired  Tobacco Use  . Smoking status: Former Smoker    Packs/day: 0.75    Years: 70.00    Pack years: 52.50    Types: Cigarettes    Start date: 01/02/1945    Last attempt to quit: 05/03/2014    Years since quitting: 2.6  . Smokeless tobacco: Former Systems developer  . Tobacco comment: Quit 05/03/14  Substance and Sexual Activity  . Alcohol use: No    Alcohol/week: 0.0 oz  . Drug use: No  . Sexual activity: Not on file  Other Topics Concern  . Not on file  Social History Narrative     Pulmonary (11/17/16):   Originally from [facility]. Previously lived in [place]. 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 No treatment but did get monitored with CXR. Currently has a Proofreader for 10 years and a dog. No mold exposure. She lives alone in an apartment. No hobbies currently.       Objective:   Physical Exam There were no vitals taken for this visit.  General:  Awake. Alert. No acute distress. Sitting watching TV. Family at bedside.  Integument:  Warm & dry. No rash on exposed skin. No bruising. Extremities:  No cyanosis or clubbing.  HEENT:  Moist mucus membranes. No oral ulcers. No scleral injection or icterus. Endotracheal tube in place. PERRL. Cardiovascular:  Regular rate. No edema. No appreciable JVD.  Pulmonary:  Good aeration & clear to auscultation bilaterally. Symmetric chest wall expansion. No accessory muscle use. Abdomen: Soft. Normal bowel sounds. Nondistended. Grossly nontender. Musculoskeletal:  Normal bulk and tone. Hand grip strength 5/5 bilaterally. No joint deformity or effusion appreciated. Neurological:  Cranial nerves 2-12 grossly in tact. No meningismus. Moving all 4 extremities equally.   *** General:  Awake. Alert. No acute distress. Obese. Accompanied by his son-in-law today. Integument:  Warm & dry. No rash on exposed skin. No bruising. Extremities:  No cyanosis or clubbing.  HEENT:  Moist mucus membranes. No oral ulcers. No scleral injection or icterus. Moderate bilateral nasal turbinate swelling with pale mucosa Cardiovascular:  Regular rate. No edema. No appreciable JVD.  Pulmonary:  Good aeration & clear to auscultation bilaterally. Normal work of breathing on room air. Abdomen: Soft. Normal bowel sounds. Protuberant. Musculoskeletal:  Normal bulk and tone. No joint deformity or effusion appreciated.  PFT 11/26/16: FVC 2.42 L (140%) FEV1 1.51 (114%) FEV1/FVC0.62 FEF 25-75  0.80 L (71%)  negative bronchodilator response  3.48 L (74%) RV 47% DLCO corrected 34% 03/22/14: FVC 2.77 L (132%) FEV1 1.57 L (102%) FEV1/FVC 0.57 FEF 25-75 0.72 L (47%)   6MWT ***  IMAGING CT CHEST W/ CONTRAST 10/03/16 (previously reviewed by me):  Status post left upper lobectomy. 1.2 cm partially groundglass nodule in right lower lobe noted which appears more solid compared with imaging performed previously and shows signs of some progression. Subcentimeter nodules in right lung also noted without obvious enlargement. No pathologic mediastinal adenopathy. Pleural thickening bilaterally but most notably on the right. No obvious pleural effusion or pericardial effusion. Atypical predominant emphysematous changes.  CT CHEST W/ CONTRAST 03/31/16 (previously reviewed by me):  Volume loss on the left with apical predominant emphysematous changes. Right hilar  lymph node appears enlarged. No pathologic mediastinal adenopathy however. No pleural effusion or thickening. No pericardial effusion. Slight nodular density within right upper lobe appreciated.  CARDIAC TTE (01/21/12): LV with mild LVH & EF 55-60%. LA & RA normal in size. RV normal in size and function. No aortic stenosis or regurgitation. Aortic root normal in size. Trivial mitral regurgitation without stenosis. No significant pulmonic regurgitation. Trivial tricuspid regurgitation. No pericardial effusion.  PATHOLOGY Left Upper Lobe Resection & Lymph Node Dissection (05/04/14): Invasive moderately to poorly differentiated squamous cell carcinoma spanning 6 cm in greatest dimension. Margins negative. Focal superficial visceropleural invasion identified. Levels 10L, 11L, 7, & 5 lymph nodes biopsied without malignancy or atypia. pT2b, pN0.  LABS 11/17/16 Alpha-1 antitrypsin: MM (217) RAST Panel:  Dog 0.13, D farine 0.18, & D pternoyssinus 0.31 IgE:  173  05/02/14 ABG on RA:  7.42 / 35 / 69 / 93%    Assessment & Plan:  83 y.o.   female with mild COPD with  emphysema based on my review of her spirometry above showing mild airway obstruction and imaging showing atypical predominant emphysema. Patient does have a known history of non-small cell lung cancer status post left upper lobe resection in 2016 as well as multiple pulmonary nodules. I reviewed the patient's chest CT scan with her today and agree with continuing surveillance utilizing chest CT imaging. She seems to be recovering from her recent upper respiratory tract infection. She does seem to have physical exam findings consistent with allergic rhinitis but also has symptoms that would correlate to her significant underlying pulmonary emphysema. She does continue to abstain from tobacco use. I believe she may benefit from the addition of a LAMA  to her regimen. I cautioned the patient against possible urinary retention as well as worsening closed-angle glaucoma. I instructed her to contact my office for prescription if her new inhaler proved to be more effective. She will contact our office if she has any further questions or concerns before her next appointment.   1. Mild COPD with emphysema: 2. Nonseasonal allergic rhinitis: 3. NSCLC:  Continuing to follow with Medical Oncology. 4. Health maintenance: Status post Pneumovax February 2015 & Prevnar administered at last appointment. 5. Follow-up: Return to clinic in 4 weeks or sooner if needed.  Sonia Baller Ashok Cordia, M.D. [facility] Pulmonary & Critical [facility] Pager:  [phone] After 3pm or if no response, call [phone] 8:33 AM 12/24/16

## 2016-12-25 NOTE — Progress Notes (Signed)
Subjective:    Patient ID: [name] [name], female    DOB: 02/15/1933, 83 y.o.   MRN: [id number]  C.C.:  Follow-up for Mild COPD w/ Emphysema, Nonseasonal Allergic Rhinitis, & NSCLC.   HPI Previously followed by Dr. Melvyn Novas. Patient hypoxic on exertion today.   Mild COPD with emphysema: Patient given sample of Trelegy to try in place of Advair at last appointment. She reports intermittent coughing & wheezing. She does feel her breathing improved since she started on Trelegy. She doesn't notice any symptomatic relief with her Albuterol rescue inhaler.   Non-seasonal allergic rhinitis: Previously prescribed antihistamine and intranasal medications by her PCP. She is taking Claritin without relief.   NSCLC: Following with medical oncology. Status post left upper lobe lobectomy 05/04/14 by [name]. Stage IIA (T2b, N0, M0) treated with adjuvant chemotherapy with carboplatin and paclitaxel. Last dose of chemotherapy given 08/26/14. Follow-up imaging as per Medical Oncology. She reports her cough did produce a blood tinged mucus once since last appointment. She has a follow-up CT scheduled for 11/26 & is seeing Med Oncology on 11/28.   Review of Systems She has had some left neck pain. She does report some chest "heaviness". No other frank chest pain. She has increased sinus congestion & frontal headache. No there headache. No fever, chills, or sweats.   Allergies  Allergen Reactions  . Darvon [Propoxyphene Hcl]   . Lipitor [Atorvastatin]     myalgia's    Current Outpatient Medications on File Prior to Visit  Medication Sig Dispense Refill  . ADVAIR DISKUS 250-50 MCG/DOSE AEPB Inhale 1 puff into the lungs 2 (two) times daily.    Marland Kitchen ALPRAZolam (XANAX) 0.5 MG tablet Take 0.5 mg by mouth 3 (three) times daily as needed for anxiety. Reported on 03/28/2015    . aspirin 81 MG tablet Take 81 mg by mouth daily.    Marland Kitchen esomeprazole (NEXIUM) 40 MG capsule Take 40 mg by mouth daily.    . fluorometholone  (FML) 0.1 % ophthalmic suspension Place 1 drop into both eyes daily.    . metoprolol succinate (TOPROL-XL) 25 MG 24 hr tablet Take 1 tablet by mouth daily.    . metoprolol tartrate (LOPRESSOR) 25 MG tablet Take 25 mg by mouth 2 (two) times daily.    . multivitamin-iron-minerals-folic acid (CENTRUM) chewable tablet Chew 1 tablet by mouth daily.    . traMADol (ULTRAM) 50 MG tablet Take 50 mg by mouth 2 (two) times daily as needed.    . valsartan-hydrochlorothiazide (DIOVAN-HCT) 160-25 MG tablet Take 1 tablet by mouth daily.    . VENTOLIN HFA 108 (90 BASE) MCG/ACT inhaler      No current facility-administered medications on file prior to visit.     Past Medical History:  Diagnosis Date  . Allergic rhinitis   . Anxiety   . Arthritis   . Cancer ([place])   . COPD (chronic obstructive pulmonary disease) ([place])   . Diabetes mellitus without complication ([place])   . Emphysema of lung ([place])   . History of positive PPD, untreated   . HTN (hypertension)   . Palpitations   . Primary cancer of left upper lobe of lung ([place]) 05/23/2014   Stage IIA- T2b,N0- thoracoscopic left upper lobectomy 05/04/14  . SOB (shortness of breath)     Past Surgical History:  Procedure Laterality Date  . CRYO INTERCOSTAL NERVE BLOCK Left 05/04/2014   Performed by [name], MD at [facility]  . LEFT VIDEO ASSISTED THORACOSCOPY (VATS)/LEFT UPPER LOBECTOMY  Left 05/04/2014   Performed by [name], MD at [facility]  . LUNG LOBECTOMY  05/04/14   left upper lobectomy  . NODE DISSECTION Left 05/04/2014   Performed by [name], MD at [facility]  . VAGINAL HYSTERECTOMY  1966    Family History  Problem Relation Age of Onset  . Cervical cancer Mother   . Prostate cancer Father   . Heart disease Father   . Stroke Maternal Grandmother   . Angina Maternal Grandfather   . Prostate cancer Paternal Uncle   . Lung cancer Brother   . Lung disease Neg Hx     Social History   Socioeconomic History  . Marital  status: Widowed    Spouse name: Not on file  . Number of children: Not on file  . Years of education: Not on file  . Highest education level: Not on file  Social Needs  . Financial resource strain: Not on file  . Food insecurity - worry: Not on file  . Food insecurity - inability: Not on file  . Transportation needs - medical: Not on file  . Transportation needs - non-medical: Not on file  Occupational History  . Occupation: retired  Tobacco Use  . Smoking status: Former Smoker    Packs/day: 0.75    Years: 70.00    Pack years: 52.50    Types: Cigarettes    Start date: 01/02/1945    Last attempt to quit: 05/03/2014    Years since quitting: 2.6  . Smokeless tobacco: Former Systems developer  . Tobacco comment: Quit 05/03/14  Substance and Sexual Activity  . Alcohol use: No    Alcohol/week: 0.0 oz  . Drug use: No  . Sexual activity: Not on file  Other Topics Concern  . Not on file  Social History Narrative   [facility] (11/17/16):   Originally from [facility]. Previously lived in [place]. 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 No treatment but did get monitored with CXR. Currently has a Proofreader for 10 years and a dog. No mold exposure. She lives alone in an apartment. No hobbies currently.       Objective:   Physical Exam BP 138/88 (BP Location: Left Arm, Cuff Size: Normal)   Pulse 86   Ht 5\' 1"  (1.549 m)   Wt 207 lb (93.9 kg)   SpO2 98%   BMI 39.11 kg/m   General:  Awake. Obese. No distress.  Integument:  Warm. Dry. No rash. Extremities:  No cyanosis or clubbing.  HEENT:  Minimal nasal turbinate swelling. No oral ulcers. Moist mucous membranes Cardiovascular:  Regular rate. No edema. Unable to appreciate JVD with body positioning.  Pulmonary:  Symmetrically decreased breath sounds. Otherwise clear with auscultation. Abdomen: Soft. Normal bowel sounds. Protuberant. Musculoskeletal:  Normal bulk and tone. No joint deformity or  effusion appreciated. Neurological:  Cranial nerves 2-12 grossly in tact. No meningismus. Moving all 4 extremities equally.   PFT 11/26/16: FVC 2.42 L (140%) FEV1 1.51 (114%) FEV1/FVC0.62 FEF 25-75  0.80 L (71%) negative bronchodilator response  3.48 L (74%) RV 47% DLCO corrected 34% 03/22/14: FVC 2.77 L (132%) FEV1 1.57 L (102%) FEV1/FVC 0.57 FEF 25-75 0.72 L (47%)   6MWT 12/26/16:  Walked 144 meters / Baseline Sat 93% on RA / Nadir Sat 85% on RA @ 4:52 (required 3 L/m to maintain with exertion)  IMAGING CT CHEST W/ CONTRAST 10/03/16 (reviewed again today  with patient and son-in-law who was present):  Status post left upper lobectomy. 1.2 cm partially groundglass nodule in right lower lobe noted which appears more solid compared with imaging performed previously and shows signs of some progression. Subcentimeter nodules in right lung also noted without obvious enlargement. No pathologic mediastinal adenopathy. Pleural thickening bilaterally but most notably on the right. No obvious pleural effusion or pericardial effusion. Atypical predominant emphysematous changes.  CT CHEST W/ CONTRAST 03/31/16 (previously reviewed by me):  Volume loss on the left with apical predominant emphysematous changes. Right hilar lymph node appears enlarged. No pathologic mediastinal adenopathy however. No pleural effusion or thickening. No pericardial effusion. Slight nodular density within right upper lobe appreciated.  CARDIAC TTE (01/21/12): LV with mild LVH & EF 55-60%. LA & RA normal in size. RV normal in size and function. No aortic stenosis or regurgitation. Aortic root normal in size. Trivial mitral regurgitation without stenosis. No significant pulmonic regurgitation. Trivial tricuspid regurgitation. No pericardial effusion.  PATHOLOGY Left Upper Lobe Resection & Lymph Node Dissection (05/04/14): Invasive moderately to poorly differentiated squamous cell carcinoma spanning 6 cm in greatest dimension. Margins  negative. Focal superficial visceropleural invasion identified. Levels 10L, 11L, 7, & 5 lymph nodes biopsied without malignancy or atypia. pT2b, pN0.  LABS 11/17/16 Alpha-1 antitrypsin: MM (217) RAST Panel:  Dog 0.13, D farine 0.18, & D pternoyssinus 0.31 IgE:  173  05/02/14 ABG on RA:  7.42 / 35 / 69 / 93%    Assessment & Plan:  83 y.o. female with underlying mild COPD. I reviewed her chest CT scan with her again today showing significant emphysema bilaterally. Suspect her hypoxia is secondary to her underlying emphysema as well as her previous left upper lobectomy. Chronic thromboembolic disease is possible but less likely. However, given her history of malignancy I am cautiously ordering a VQ scan to help evaluate. Anemia is also less likely but must be ruled out as she has no recent lab work for me to review. I suspect her chest discomfort with exertion is secondary to her hypoxia and likely exertional angina. She certainly has risk factors for coronary artery disease and has not been evaluated or had follow-up with cardiology for a couple of years now. I instructed the patient to contact her cardiologist for a follow-up appointment and have also sent him a message electronically to make him aware. I instructed the patient and her son-in-law contact my office if she had questions or concerns before her next appointment.  1. Mild COPD with emphysema:  Continuing patient on Trelegy & rescue inhaler. 2. Acute hypoxic respiratory failure: Prescribing the patient oxygen at 3 L/m with exertion. Checking serum CBC. Checking VQ scan. Also ordering portable oxygen concentrator. 3. Chest discomfort:  Likely some exertion angina with hypoxia. Patient to contact her cardiology for an evaluation & probable stress test. 4. Nonseasonal allergic rhinitis:  Continuing Claritin. Starting Singulair 10mg  qhs. 5. NSCLC:  Continuing to follow with Medical Oncology. Has repeat imaging on 11/26 scheduled. 6. Health  maintenance: Status post Pneumovax February 2015 & Prevnar administered at last appointment. 7. Follow-up: Return to clinic in 4 weeks or sooner if needed.   [name] [name], M.D. [facility] Pager:  [phone] After 3pm or if no response, call [phone] 11:10 AM 12/26/16

## 2016-12-26 ENCOUNTER — Ambulatory Visit (INDEPENDENT_AMBULATORY_CARE_PROVIDER_SITE_OTHER): Payer: Medicare HMO | Admitting: *Deleted

## 2016-12-26 ENCOUNTER — Ambulatory Visit (INDEPENDENT_AMBULATORY_CARE_PROVIDER_SITE_OTHER): Payer: Medicare HMO | Admitting: Pulmonary Disease

## 2016-12-26 ENCOUNTER — Encounter: Payer: Self-pay | Admitting: Pulmonary Disease

## 2016-12-26 VITALS — BP 138/88 | HR 86 | Ht 61.0 in | Wt 207.0 lb

## 2016-12-26 DIAGNOSIS — J449 Chronic obstructive pulmonary disease, unspecified: Secondary | ICD-10-CM | POA: Diagnosis not present

## 2016-12-26 DIAGNOSIS — J309 Allergic rhinitis, unspecified: Secondary | ICD-10-CM | POA: Diagnosis not present

## 2016-12-26 DIAGNOSIS — C3490 Malignant neoplasm of unspecified part of unspecified bronchus or lung: Secondary | ICD-10-CM

## 2016-12-26 DIAGNOSIS — J439 Emphysema, unspecified: Secondary | ICD-10-CM | POA: Diagnosis not present

## 2016-12-26 DIAGNOSIS — J301 Allergic rhinitis due to pollen: Secondary | ICD-10-CM

## 2016-12-26 MED ORDER — MONTELUKAST SODIUM 10 MG PO TABS: 10.0000 mg | ORAL_TABLET | Freq: Every day | ORAL | 11 refills | Status: DC

## 2016-12-26 MED ORDER — FLUTICASONE-UMECLIDIN-VILANT 100-62.5-25 MCG/INH IN AEPB: 1.0000 | INHALATION_SPRAY | Freq: Every day | RESPIRATORY_TRACT | 6 refills | Status: DC

## 2016-12-26 NOTE — Addendum Note (Signed)
Addended by: Georjean Mode on: 12/26/2016 12:12 PM   Modules accepted: Orders

## 2016-12-26 NOTE — Progress Notes (Signed)
SIX MIN WALK 12/26/2016  Medications Toprol 25mg  taken at 9:00am  Supplimental Oxygen during Test? (L/min) No  Laps 3  Partial Lap (in Meters) 0  Baseline BP (sitting) 126/80  Baseline Heartrate 87  Baseline Dyspnea (Borg Scale) 4  Baseline Fatigue (Borg Scale) 2  Baseline SPO2 93  BP (sitting) 140/86  Heartrate 110  Dyspnea (Borg Scale) 7  Fatigue (Borg Scale) 7  SPO2 89  BP (sitting) 138/78  Heartrate 93  SPO2 99  Stopped or Paused before Six Minutes Yes  Other Symptoms at end of Exercise paused at 2:34 to apply 2L O2. spo2 85 HR 93.  Distance Completed 144  Tech Comments: test performed with forehead probe. pt walked at slow pace. pt's spO2 was 90% at time of arrival but quickly increased to 93%. test paused at 4:52 to apply 2L O2 due to spO2 being at 85% HR 103 test resumed after spO2 increased to 97% HR 93. pt was unable to complete test and stopped at 1:36 due to sob and fatigue. pt c/o chest tightness, wheezing, leg pain & Hip pain during test, but states this is normal wit this amount of exertion.

## 2016-12-26 NOTE — Patient Instructions (Addendum)
   Continue using your Trelegy inhaler daily. We are sending in a prescription for it to your pharmacy.  We are starting you on oxygen at 3 liters per minute when you are up walking & exerting yourself.  Please call Dr. Irven Shelling office to schedule an appointment ASAP to check out your heart.   I'm starting you on Singulair to help your allergies & breathing. Call if you have any problems with this medication.  We will see you back in 4 weeks to make sure you're getting better.    TESTS ORDERED: 1. Serum CBC today 2. V/Q Scan

## 2016-12-30 ENCOUNTER — Telehealth: Payer: Self-pay | Admitting: Pulmonary Disease

## 2016-12-30 NOTE — Telephone Encounter (Signed)
PCC's can we send this to another DME for a simply-go mini portable concentrator? [name] from [facility] stated we are now supposed to qualify patients for a simply go. I spoke with [name] and she stated to schedule pt for a 6 minute walk to see if pt qualifies and then we can order the POC.

## 2016-12-31 NOTE — Telephone Encounter (Signed)
Spoke with patient-she is aware that [facility] does not do evaluation for Simply go mini-we do those in the office now. Pt has been set up for appt to titrate on SGM POC on Thursday 01/08/17 at 2pm. Nothing more needed at this time.

## 2017-01-05 ENCOUNTER — Other Ambulatory Visit: Payer: Medicare HMO

## 2017-01-05 ENCOUNTER — Encounter (HOSPITAL_COMMUNITY): Payer: Self-pay

## 2017-01-05 ENCOUNTER — Ambulatory Visit (HOSPITAL_COMMUNITY)
Admission: RE | Admit: 2017-01-05 | Discharge: 2017-01-05 | Disposition: A | Discharge status: Home / Self Care | Payer: Medicare HMO | Source: Ambulatory Visit | Attending: Internal Medicine | Admitting: Internal Medicine

## 2017-01-05 ENCOUNTER — Other Ambulatory Visit (HOSPITAL_BASED_OUTPATIENT_CLINIC_OR_DEPARTMENT_OTHER): Payer: Medicare HMO

## 2017-01-05 DIAGNOSIS — Z902 Acquired absence of lung [part of]: Secondary | ICD-10-CM

## 2017-01-05 DIAGNOSIS — I700 Atherosclerosis of aorta: Secondary | ICD-10-CM | POA: Diagnosis not present

## 2017-01-05 DIAGNOSIS — I2510 Atherosclerotic heart disease of native coronary artery without angina pectoris: Secondary | ICD-10-CM | POA: Insufficient documentation

## 2017-01-05 DIAGNOSIS — R918 Other nonspecific abnormal finding of lung field: Secondary | ICD-10-CM | POA: Insufficient documentation

## 2017-01-05 DIAGNOSIS — C3412 Malignant neoplasm of upper lobe, left bronchus or lung: Secondary | ICD-10-CM

## 2017-01-05 DIAGNOSIS — J432 Centrilobular emphysema: Secondary | ICD-10-CM | POA: Insufficient documentation

## 2017-01-05 DIAGNOSIS — E279 Disorder of adrenal gland, unspecified: Secondary | ICD-10-CM | POA: Insufficient documentation

## 2017-01-05 DIAGNOSIS — J449 Chronic obstructive pulmonary disease, unspecified: Secondary | ICD-10-CM

## 2017-01-05 LAB — CBC WITH DIFFERENTIAL/PLATELET
BASO%: 0.3 % (ref 0.0–2.0)
Basophils Absolute: 0 10*3/uL (ref 0.0–0.1)
EOS%: 4.3 % (ref 0.0–7.0)
Eosinophils Absolute: 0.4 10*3/uL (ref 0.0–0.5)
HCT: 35.4 % (ref 34.8–46.6)
HGB: 11.6 g/dL (ref 11.6–15.9)
LYMPH#: 3.2 10*3/uL (ref 0.9–3.3)
LYMPH%: 34 % (ref 14.0–49.7)
MCH: 28.2 pg (ref 25.1–34.0)
MCHC: 32.8 g/dL (ref 31.5–36.0)
MCV: 85.9 fL (ref 79.5–101.0)
MONO#: 0.8 10*3/uL (ref 0.1–0.9)
MONO%: 8.1 % (ref 0.0–14.0)
NEUT#: 5 10*3/uL (ref 1.5–6.5)
NEUT%: 53.3 % (ref 38.4–76.8)
Platelets: 245 10*3/uL (ref 145–400)
RBC: 4.12 10*6/uL (ref 3.70–5.45)
RDW: 15.3 % — ABNORMAL HIGH (ref 11.2–14.5)
WBC: 9.4 10*3/uL (ref 3.9–10.3)

## 2017-01-05 LAB — COMPREHENSIVE METABOLIC PANEL
ALT: 9 U/L (ref 0–55)
AST: 15 U/L (ref 5–34)
Albumin: 3.3 g/dL — ABNORMAL LOW (ref 3.5–5.0)
Alkaline Phosphatase: 104 U/L (ref 40–150)
Anion Gap: 10 mEq/L (ref 3–11)
BUN: 21.5 mg/dL (ref 7.0–26.0)
CHLORIDE: 111 meq/L — AB (ref 98–109)
CO2: 23 meq/L (ref 22–29)
CREATININE: 1.2 mg/dL — AB (ref 0.6–1.1)
Calcium: 9.6 mg/dL (ref 8.4–10.4)
EGFR: 48 mL/min/{1.73_m2} — ABNORMAL LOW (ref >60)
Glucose: 116 mg/dl (ref 70–140)
Potassium: 3.8 mEq/L (ref 3.5–5.1)
SODIUM: 144 meq/L (ref 136–145)
Total Bilirubin: 0.37 mg/dL (ref 0.20–1.20)
Total Protein: 7.9 g/dL (ref 6.4–8.3)

## 2017-01-05 MED ORDER — IOPAMIDOL (ISOVUE-300) INJECTION 61%
75.0000 mL | Freq: Once | INTRAVENOUS | Status: AC | PRN
  Administered 2017-01-05: 75 mL via INTRAVENOUS

## 2017-01-05 MED ORDER — IOPAMIDOL (ISOVUE-300) INJECTION 61%
INTRAVENOUS | Status: AC
  Filled 2017-01-05: qty 75

## 2017-01-07 ENCOUNTER — Telehealth: Payer: Self-pay | Admitting: Internal Medicine

## 2017-01-07 ENCOUNTER — Ambulatory Visit (HOSPITAL_BASED_OUTPATIENT_CLINIC_OR_DEPARTMENT_OTHER): Payer: Medicare HMO | Admitting: Internal Medicine

## 2017-01-07 ENCOUNTER — Encounter: Payer: Self-pay | Admitting: Internal Medicine

## 2017-01-07 ENCOUNTER — Ambulatory Visit (HOSPITAL_COMMUNITY)
Admission: RE | Admit: 2017-01-07 | Discharge: 2017-01-07 | Disposition: A | Discharge status: Home / Self Care | Payer: Medicare HMO | Source: Ambulatory Visit | Attending: Pulmonary Disease | Admitting: Pulmonary Disease

## 2017-01-07 VITALS — BP 143/68 | HR 77 | Temp 97.7°F | Resp 18 | Ht 61.0 in | Wt 209.8 lb

## 2017-01-07 DIAGNOSIS — J449 Chronic obstructive pulmonary disease, unspecified: Secondary | ICD-10-CM

## 2017-01-07 DIAGNOSIS — J439 Emphysema, unspecified: Secondary | ICD-10-CM | POA: Insufficient documentation

## 2017-01-07 DIAGNOSIS — R918 Other nonspecific abnormal finding of lung field: Secondary | ICD-10-CM | POA: Diagnosis not present

## 2017-01-07 DIAGNOSIS — C3412 Malignant neoplasm of upper lobe, left bronchus or lung: Secondary | ICD-10-CM

## 2017-01-07 MED ORDER — TECHNETIUM TO 99M ALBUMIN AGGREGATED
4.0000 | Freq: Once | INTRAVENOUS | Status: AC | PRN
  Administered 2017-01-07: 4 via INTRAVENOUS

## 2017-01-07 MED ORDER — TECHNETIUM TC 99M DIETHYLENETRIAME-PENTAACETIC ACID
29.0000 | Freq: Once | INTRAVENOUS | Status: AC | PRN
  Administered 2017-01-07: 29 via INTRAVENOUS

## 2017-01-07 MED ORDER — DOXYCYCLINE HYCLATE 100 MG PO TABS: 100.0000 mg | ORAL_TABLET | Freq: Two times a day (BID) | ORAL | 0 refills | Status: DC

## 2017-01-07 NOTE — Progress Notes (Signed)
[facility] [phone]   [phone]  OFFICE PROGRESS NOTE  Nolene Ebbs, MD [place] [address]  DIAGNOSIS: Lung cancer  Staging form: Lung, AJCC 7th Edition  Clinical: Stage IIA (T2b, N0, M0) - Signed by [name], MD on 05/31/2014  PRIOR THERAPY:  1) Status post left upper lobectomy with lymph node dissection under the care of [name] on 05/04/2014. 2) Adjuvant chemotherapy with carboplatin for an AUC of 5 and paclitaxel 175 mg/m given every 3 weeks with Neulasta support. A total of 4 cycles of adjuvant chemotherapy are planned. Status post 4 cycles. Last dose was given 08/26/2014.  CURRENT THERAPY: Observation.  INTERVAL HISTORY: Manon Banbury 83 y.o. female returns to the clinic today for follow-up visit accompanied by her daughter and son.  The patient is feeling fine today except for fatigue and shortness of breath with exertion.  She is followed by Dr. Ashok Cordia for her COPD.  She denied having any recent chest pain but continues to have cough with no hemoptysis.  She denied having any weight loss or night sweats.  She has no nausea, vomiting, diarrhea or constipation.  She has been on observation since July 2016.  The patient had repeat CT scan of the chest performed recently and she is here for evaluation and discussion of her scan results.   MEDICAL HISTORY: Past Medical History:  Diagnosis Date  . Allergic rhinitis   . Anxiety   . Arthritis   . Cancer ([place])   . COPD (chronic obstructive pulmonary disease) ([place])   . Diabetes mellitus without complication ([place])   . Emphysema of lung ([place])   . History of positive PPD, untreated   . HTN (hypertension)   . Palpitations   . Primary cancer of left upper lobe of lung ([place]) 05/23/2014   Stage IIA- T2b,N0- thoracoscopic left upper lobectomy 05/04/14  . SOB (shortness of breath)     ALLERGIES:  is allergic to darvon [propoxyphene hcl] and lipitor  [atorvastatin].  MEDICATIONS:  Current Outpatient Medications  Medication Sig Dispense Refill  . ADVAIR DISKUS 250-50 MCG/DOSE AEPB Inhale 1 puff into the lungs 2 (two) times daily.    Marland Kitchen ALPRAZolam (XANAX) 0.5 MG tablet Take 0.5 mg by mouth 3 (three) times daily as needed for anxiety. Reported on 03/28/2015    . aspirin 81 MG tablet Take 81 mg by mouth daily.    Marland Kitchen esomeprazole (NEXIUM) 40 MG capsule Take 40 mg by mouth daily.    . fluorometholone (FML) 0.1 % ophthalmic suspension Place 1 drop into both eyes daily.    . Fluticasone-Umeclidin-Vilant (TRELEGY ELLIPTA) 100-62.5-25 MCG/INH AEPB Inhale 1 puff daily into the lungs. 1 each 6  . metoprolol succinate (TOPROL-XL) 25 MG 24 hr tablet Take 1 tablet by mouth daily.    . metoprolol tartrate (LOPRESSOR) 25 MG tablet Take 25 mg by mouth 2 (two) times daily.    . montelukast (SINGULAIR) 10 MG tablet Take 1 tablet (10 mg total) at bedtime by mouth. 30 tablet 11  . multivitamin-iron-minerals-folic acid (CENTRUM) chewable tablet Chew 1 tablet by mouth daily.    . traMADol (ULTRAM) 50 MG tablet Take 50 mg by mouth 2 (two) times daily as needed.    . valsartan-hydrochlorothiazide (DIOVAN-HCT) 160-25 MG tablet Take 1 tablet by mouth daily.    . VENTOLIN HFA 108 (90 BASE) MCG/ACT inhaler      No current facility-administered medications for this visit.  SURGICAL HISTORY:  Past Surgical History:  Procedure Laterality Date  . LEAD REMOVAL Left 05/04/2014   Procedure: CRYO INTERCOSTAL NERVE BLOCK;  Surgeon: [name], MD;  Location: [facility];  Service: Thoracic;  Laterality: Left;  . LUNG LOBECTOMY  05/04/14   left upper lobectomy  . NODE DISSECTION Left 05/04/2014   Procedure: NODE DISSECTION;  Surgeon: [name], MD;  Location: [facility];  Service: Thoracic;  Laterality: Left;  Marland Kitchen VAGINAL HYSTERECTOMY  1966  . VIDEO ASSISTED THORACOSCOPY (VATS)/ LOBECTOMY Left 05/04/2014   Procedure: LEFT VIDEO ASSISTED THORACOSCOPY (VATS)/LEFT  UPPER LOBECTOMY;  Surgeon: [name], MD;  Location: [facility];  Service: Thoracic;  Laterality: Left;    REVIEW OF SYSTEMS:  Constitutional: positive for fatigue Eyes: negative Ears, nose, mouth, throat, and face: negative Respiratory: positive for cough and dyspnea on exertion Cardiovascular: negative Gastrointestinal: negative Genitourinary:negative Integument/breast: negative Hematologic/lymphatic: negative Musculoskeletal:negative Neurological: negative Behavioral/Psych: negative Endocrine: negative Allergic/Immunologic: negative   PHYSICAL EXAMINATION: General appearance: alert, cooperative, fatigued and no distress Head: Normocephalic, without obvious abnormality, atraumatic Neck: no adenopathy, no JVD, supple, symmetrical, trachea midline and thyroid not enlarged, symmetric, no tenderness/mass/nodules Lymph nodes: Cervical, supraclavicular, and axillary nodes normal. Resp: clear to auscultation bilaterally Back: symmetric, no curvature. ROM normal. No CVA tenderness. Cardio: regular rate and rhythm, S1, S2 normal, no murmur, click, rub or gallop GI: soft, non-tender; bowel sounds normal; no masses,  no organomegaly Extremities: extremities normal, atraumatic, no cyanosis or edema Neurologic: Alert and oriented X 3, normal strength and tone. Normal symmetric reflexes. Normal coordination and gait  ECOG PERFORMANCE STATUS: 1 - Symptomatic but completely ambulatory  Blood pressure (!) 143/68, pulse 77, temperature 97.7 F (36.5 C), temperature source Oral, resp. rate 18, height 5\' 1"  (1.549 m), weight 209 lb 12.8 oz (95.2 kg), SpO2 93 %.  LABORATORY DATA: Lab Results  Component Value Date   WBC 9.4 01/05/2017   HGB 11.6 01/05/2017   HCT 35.4 01/05/2017   MCV 85.9 01/05/2017   PLT 245 01/05/2017      Chemistry      Component Value Date/Time   NA 144 01/05/2017 1017   K 3.8 01/05/2017 1017   CL 110 07/25/2014 1318   CO2 23 01/05/2017 1017   BUN 21.5  01/05/2017 1017   CREATININE 1.2 (H) 01/05/2017 1017      Component Value Date/Time   CALCIUM 9.6 01/05/2017 1017   ALKPHOS 104 01/05/2017 1017   AST 15 01/05/2017 1017   ALT 9 01/05/2017 1017   BILITOT 0.37 01/05/2017 1017       RADIOGRAPHIC STUDIES: Ct Chest W Contrast  Result Date: 01/05/2017 CLINICAL DATA:  Lung cancer, chemotherapy complete. Left lateral neck pain since surgery. Productive cough. Increase shortness of breath. EXAM: CT CHEST WITH CONTRAST TECHNIQUE: Multidetector CT imaging of the chest was performed during intravenous contrast administration. CONTRAST:  34mL ISOVUE-300 IOPAMIDOL (ISOVUE-300) INJECTION 61% COMPARISON:  10/03/2016 and 08/21/2014. FINDINGS: Cardiovascular: Atherosclerotic calcification of the arterial vasculature, including moderate to severe involvement of the coronary arteries. Heart size normal. No pericardial effusion. Mediastinum/Nodes: Mediastinal lymph nodes are not enlarged by CT size criteria. Hilar lymph nodes measure up to 12 mm on the right, similar. No left hilar or axillary lymph nodes. Esophagus is grossly unremarkable. Lungs/Pleura: Moderate centrilobular emphysema. Mild interstitial and subpleural ground-glass, new. Left upper lobectomy. A nodular lesion in the dependent portion of the right lower lobe, previously described on 10/03/2016, has less of a nodular appearance currently (image 75)a. No  pleural fluid. Airway is otherwise unremarkable. Upper Abdomen: Visualized portion of the liver is unremarkable. 1.4 cm right adrenal nodule, unchanged. Low-attenuation lesions in the right kidney measure up to 1.8 cm, incompletely imaged. Visualized portions of the left kidney, spleen, pancreas, stomach and bowel are grossly unremarkable. No upper abdominal adenopathy. Musculoskeletal: Degenerative changes in the spine. No worrisome lytic or sclerotic lesions. IMPRESSION: 1. New interstitial and subpleural ground-glass, right greater than left, which may  be due to an atypical or viral pneumonia. Superimposed interstitial lung disease is considered less likely given the short timeframe from 10/03/2016. 2. Previously described nodular lesion in the right lower lobe is less nodular in appearance on the current study. Continued attention on followup exams is warranted. 3. Aortic atherosclerosis (ICD10-170.0). Coronary artery calcification. 4.  Emphysema (ICD10-J43.9). 5. Small right adrenal nodule, unchanged from 08/21/2014, favoring an adenoma. Electronically Signed   By: Lorin Picket M.D.   On: 01/05/2017 14:28    ASSESSMENT AND PLAN:  This is a very pleasant 83 years old African-American female with a stage IIA non-small cell lung cancer status post left upper lobectomy with lymph node dissection followed by 4 cycles of adjuvant systemic chemotherapy with carboplatin and paclitaxel The patient has been on observation since July 2016. The patient is feeling fine today except for the fatigue and shortness of breath. Her recent CT scan of the chest showed improvement of the previous right lower lobe lung nodule but the patient has groundglass opacities suspicious for inflammatory process. I personally and independently reviewed the scan images and discussed the results with the patient and her family. I recommended for her to continue on observation with repeat CT scan of the chest in 4 months. Regarding the groundglass opacity and questionable inflammatory process, I will start the patient empirically on doxycycline 100 mg p.o. twice daily for 7 days. For COPD, the patient will continue her current follow-up visit and evaluation by her pulmonologist.  Dr. Ashok Cordia is leaving town and the patient will establish care with another pulmonologist for evaluation of her condition. She was advised to call immediately if she has any concerning symptoms in the interval. The patient voices understanding of current disease status and treatment options and is in  agreement with the current care plan. All questions were answered. The patient knows to call the clinic with any problems, questions or concerns. We can certainly see the patient much sooner if necessary. I spent 15 minutes counseling the patient face to face. The total time spent in the appointment was 25 minutes.  Disclaimer: This note was dictated with voice recognition software. Similar sounding words can inadvertently be transcribed and may not be corrected upon review.

## 2017-01-07 NOTE — Telephone Encounter (Signed)
Scheduled appt per 11/28 los - Gave patient AVS and calender per los.

## 2017-01-08 ENCOUNTER — Ambulatory Visit (INDEPENDENT_AMBULATORY_CARE_PROVIDER_SITE_OTHER): Payer: Medicare HMO | Admitting: *Deleted

## 2017-01-08 ENCOUNTER — Telehealth: Payer: Self-pay

## 2017-01-08 DIAGNOSIS — J449 Chronic obstructive pulmonary disease, unspecified: Secondary | ICD-10-CM

## 2017-01-08 NOTE — Telephone Encounter (Signed)
Pt came in for POC eval, during visit pt states she was recommended by oncology to see Dr. Elsworth Soho.  Dr. Ashok Cordia please advise if you agree. Thanks.

## 2017-01-08 NOTE — Telephone Encounter (Signed)
RA ok to switch to you?

## 2017-01-08 NOTE — Telephone Encounter (Signed)
That's fine

## 2017-01-08 NOTE — Progress Notes (Signed)
Pt in office for POC eval.

## 2017-01-09 ENCOUNTER — Telehealth: Payer: Self-pay | Admitting: Pulmonary Disease

## 2017-01-09 DIAGNOSIS — J449 Chronic obstructive pulmonary disease, unspecified: Secondary | ICD-10-CM

## 2017-01-09 NOTE — Telephone Encounter (Signed)
I fixed the order, it was an error. Pulse at 3L. FYI PCC's.

## 2017-01-09 NOTE — Telephone Encounter (Signed)
Okay with me. Since she was recently seen in the lumbar, follow-up visit in February should be okay with TP /me

## 2017-01-12 NOTE — Telephone Encounter (Signed)
Order sent to [facility] by [facility]

## 2017-01-13 NOTE — Telephone Encounter (Signed)
Patient has an appt with TP on 12/14. Will close this message.

## 2017-01-23 ENCOUNTER — Telehealth: Payer: Self-pay | Admitting: Adult Health

## 2017-01-23 ENCOUNTER — Ambulatory Visit: Payer: Medicare HMO | Admitting: Adult Health

## 2017-01-23 NOTE — Telephone Encounter (Signed)
Spoke with patient. I was able to schedule patient with RA for 12/17 at 11am. Patient verbalized understanding. Nothing else needed at time of call.

## 2017-01-26 ENCOUNTER — Ambulatory Visit: Payer: Medicare HMO | Admitting: Pulmonary Disease

## 2017-02-25 ENCOUNTER — Ambulatory Visit (INDEPENDENT_AMBULATORY_CARE_PROVIDER_SITE_OTHER): Payer: Medicare HMO | Admitting: Pulmonary Disease

## 2017-02-25 ENCOUNTER — Encounter: Payer: Self-pay | Admitting: Pulmonary Disease

## 2017-02-25 DIAGNOSIS — J849 Interstitial pulmonary disease, unspecified: Secondary | ICD-10-CM | POA: Diagnosis not present

## 2017-02-25 DIAGNOSIS — J432 Centrilobular emphysema: Secondary | ICD-10-CM | POA: Diagnosis not present

## 2017-02-25 DIAGNOSIS — C3412 Malignant neoplasm of upper lobe, left bronchus or lung: Secondary | ICD-10-CM

## 2017-02-25 DIAGNOSIS — J9611 Chronic respiratory failure with hypoxia: Secondary | ICD-10-CM | POA: Insufficient documentation

## 2017-02-25 NOTE — Assessment & Plan Note (Signed)
Encouraged her to use oxygen during sleep and exertion

## 2017-02-25 NOTE — Assessment & Plan Note (Signed)
Very low DLCO may be related to emphysema plus ILD on underlying pulmonary hypertension  We will see on serial CT how this evolves

## 2017-02-25 NOTE — Progress Notes (Signed)
   Subjective:    Patient ID: [name] [name], female    DOB: 05/31/1933, 84 y.o.   MRN: [id number]  HPI 84 year old with mild COPD and lung cancer presents to establish care with me.  She has been followed previously by Dr. Melvyn Novas and Dr. Ashok Cordia Status post left upper lobe lobectomy 05/04/14 by [name]. Stage IIA (T2b, N0, M0) treated with adjuvant chemotherapy with carboplatin and paclitaxel.   She was set up with portable oxygen 3 L and only uses at as needed when she is exerting enough.  Does not use it during sleep. She reports tremors occasionally, she is only using albuterol once daily and denies excessive use of albuterol. VQ scan 12/2016 was low probability. CT chest 12/2016 was reviewed which shows mild interstitial lung disease with some honeycombing changes, previous nodule noted does not look nodular anymore, significant right apical emphysema   Significant tests/ events reviewed  PFT 11/26/16: FVC 2.42 L (140%) FEV1 1.51 (114%) FEV1/FVC0.62 FEF 25-75  0.80 L (71%) negative bronchodilator response  3.48 L (74%) RV 47% DLCO corrected 34% 03/22/14: FVC 2.77 L (132%) FEV1 1.57 L (102%) FEV1/FVC 0.57 FEF 25-75 0.72 L (47%)   6MWT 12/26/16:  Walked 144 meters / Baseline Sat 93% on RA / Nadir Sat 85% on RA @ 4:52 (required 3 L/m to maintain with exertion)  IMAGING CT CHEST W/ CONTRAST 10/03/16 (reviewed again today with patient and son-in-law who was present):  Status post left upper lobectomy. 1.2 cm partially groundglass nodule in right lower lobe noted which appears more solid     Past Medical History:  Diagnosis Date  . Allergic rhinitis   . Anxiety   . Arthritis   . Cancer ([place])   . COPD (chronic obstructive pulmonary disease) ([place])   . Diabetes mellitus without complication ([place])   . Emphysema of lung ([place])   . History of positive PPD, untreated   . HTN (hypertension)   . Palpitations   . Primary cancer of left upper lobe of lung ([place]) 05/23/2014   Stage  IIA- T2b,N0- thoracoscopic left upper lobectomy 05/04/14  . SOB (shortness of breath)      Review of Systems neg for any significant sore throat, dysphagia, itching, sneezing, nasal congestion or excess/ purulent secretions, fever, chills, sweats, unintended wt loss, pleuritic or exertional cp, hempoptysis, orthopnea pnd or change in chronic leg swelling. Also denies presyncope, palpitations, heartburn, abdominal pain, nausea, vomiting, diarrhea or change in bowel or urinary habits, dysuria,hematuria, rash, arthralgias, visual complaints, headache, numbness weakness or ataxia.     Objective:   Physical Exam   Gen. Pleasant, well-nourished, in no distress ENT - no thrush, no post nasal drip Neck: No JVD, no thyromegaly, no carotid bruits Lungs: no use of accessory muscles, no dullness to percussion, right basal rales no rhonchi  Cardiovascular: Rhythm regular, heart sounds  normal, no murmurs or gallops, no peripheral edema Musculoskeletal: No deformities, no cyanosis or clubbing         Assessment & Plan:

## 2017-02-25 NOTE — Assessment & Plan Note (Signed)
No evidence of recurrence on last CT Nodular density right lower lobe appears less so

## 2017-02-25 NOTE — Patient Instructions (Addendum)
Stay on Trelegy once daily Referral to pulmonary rehab program Use oxygen 2l during sleep & on exertion

## 2017-02-25 NOTE — Assessment & Plan Note (Signed)
Stay on Trelegy once daily Referral to pulmonary rehab program

## 2017-03-04 ENCOUNTER — Telehealth (HOSPITAL_COMMUNITY): Payer: Self-pay

## 2017-03-04 NOTE — Telephone Encounter (Signed)
Patients insurance is active and benefits verified through [facility] - $10.00 co-pay, no deductible, out of pocket amount of $3,400/$0.00 has been met, no co-insurance, and no pre-authorization is required. Spoke with [facility]- Reference [id number]  Patient will be contacted and scheduled.

## 2017-03-31 ENCOUNTER — Telehealth (HOSPITAL_COMMUNITY): Payer: Self-pay

## 2017-03-31 NOTE — Telephone Encounter (Signed)
Spoke with patient a couple times in regards to Pulmonary Rehab - Patient first stated that she has trouble walking due to hip and back problems. Patient also stated she has transportation issues. Tried to call this morning to follow up with patient and daughter of patient answered and stated that patient was not available at the moment. If no response from patient - will follow up.

## 2017-04-01 ENCOUNTER — Telehealth (HOSPITAL_COMMUNITY): Payer: Self-pay

## 2017-04-01 NOTE — Telephone Encounter (Signed)
Patient returned phone call and patient is ready to schedule for Pulmonary rehab. Went over insurance with patient and she understands what she is responsible for. Scheduled orientation on 04/17/2017 at 1:30pm. Patient will attend the 1:30pm exc class.

## 2017-04-06 ENCOUNTER — Telehealth: Payer: Self-pay

## 2017-04-06 NOTE — Telephone Encounter (Signed)
Patient came in to change schedule due to son have dialisis and med. Appointments also. Needed to r/s due to this. Per 2/25 walk ins.

## 2017-04-08 ENCOUNTER — Other Ambulatory Visit: Payer: Medicare HMO

## 2017-04-17 ENCOUNTER — Encounter (HOSPITAL_COMMUNITY): Admission: RE | Admit: 2017-04-17 | Payer: Medicare HMO | Source: Ambulatory Visit

## 2017-04-30 ENCOUNTER — Telehealth (HOSPITAL_COMMUNITY): Payer: Self-pay

## 2017-04-30 NOTE — Telephone Encounter (Signed)
Called and spoke with patient to follow up in regards to transportation - Patient stated Jeneen Rinks had to go into surgery and is waiting on a call for follow up. Patient also stated she came down with a cold which is why she has not called to schedule for Pulmonary Rehab. Patient would like for me to follow up at the beginning of next week.

## 2017-05-01 ENCOUNTER — Ambulatory Visit (HOSPITAL_COMMUNITY)
Admission: RE | Admit: 2017-05-01 | Discharge: 2017-05-01 | Disposition: A | Discharge status: Home / Self Care | Payer: Medicare HMO | Source: Ambulatory Visit | Attending: Internal Medicine | Admitting: Internal Medicine

## 2017-05-01 ENCOUNTER — Inpatient Hospital Stay: Payer: Medicare HMO | Attending: Internal Medicine

## 2017-05-01 DIAGNOSIS — Z7982 Long term (current) use of aspirin: Secondary | ICD-10-CM | POA: Insufficient documentation

## 2017-05-01 DIAGNOSIS — R918 Other nonspecific abnormal finding of lung field: Secondary | ICD-10-CM | POA: Diagnosis not present

## 2017-05-01 DIAGNOSIS — Z9221 Personal history of antineoplastic chemotherapy: Secondary | ICD-10-CM | POA: Insufficient documentation

## 2017-05-01 DIAGNOSIS — J449 Chronic obstructive pulmonary disease, unspecified: Secondary | ICD-10-CM | POA: Insufficient documentation

## 2017-05-01 DIAGNOSIS — I700 Atherosclerosis of aorta: Secondary | ICD-10-CM | POA: Insufficient documentation

## 2017-05-01 DIAGNOSIS — R5383 Other fatigue: Secondary | ICD-10-CM | POA: Insufficient documentation

## 2017-05-01 DIAGNOSIS — J438 Other emphysema: Secondary | ICD-10-CM | POA: Insufficient documentation

## 2017-05-01 DIAGNOSIS — E119 Type 2 diabetes mellitus without complications: Secondary | ICD-10-CM | POA: Diagnosis not present

## 2017-05-01 DIAGNOSIS — C3412 Malignant neoplasm of upper lobe, left bronchus or lung: Secondary | ICD-10-CM | POA: Insufficient documentation

## 2017-05-01 DIAGNOSIS — Z79899 Other long term (current) drug therapy: Secondary | ICD-10-CM | POA: Insufficient documentation

## 2017-05-01 DIAGNOSIS — I10 Essential (primary) hypertension: Secondary | ICD-10-CM | POA: Insufficient documentation

## 2017-05-01 DIAGNOSIS — Z9981 Dependence on supplemental oxygen: Secondary | ICD-10-CM | POA: Diagnosis not present

## 2017-05-01 DIAGNOSIS — J432 Centrilobular emphysema: Secondary | ICD-10-CM | POA: Insufficient documentation

## 2017-05-01 DIAGNOSIS — R093 Abnormal sputum: Secondary | ICD-10-CM | POA: Diagnosis not present

## 2017-05-01 DIAGNOSIS — Z902 Acquired absence of lung [part of]: Secondary | ICD-10-CM | POA: Insufficient documentation

## 2017-05-01 LAB — COMPREHENSIVE METABOLIC PANEL
ALBUMIN: 3.4 g/dL — AB (ref 3.5–5.0)
ALT: 22 U/L (ref 0–55)
AST: 25 U/L (ref 5–34)
Alkaline Phosphatase: 81 U/L (ref 40–150)
Anion gap: 11 (ref 3–11)
BUN: 16 mg/dL (ref 7–26)
CHLORIDE: 109 mmol/L (ref 98–109)
CO2: 23 mmol/L (ref 22–29)
Calcium: 9.8 mg/dL (ref 8.4–10.4)
Creatinine, Ser: 1.14 mg/dL — ABNORMAL HIGH (ref 0.60–1.10)
GFR calc Af Amer: 51 mL/min — ABNORMAL LOW (ref >60)
GFR calc non Af Amer: 44 mL/min — ABNORMAL LOW (ref >60)
GLUCOSE: 133 mg/dL (ref 70–140)
Potassium: 4.1 mmol/L (ref 3.5–5.1)
SODIUM: 143 mmol/L (ref 136–145)
Total Bilirubin: 0.3 mg/dL (ref 0.2–1.2)
Total Protein: 8.1 g/dL (ref 6.4–8.3)

## 2017-05-01 LAB — CBC WITH DIFFERENTIAL/PLATELET
Basophils Absolute: 0.1 10*3/uL (ref 0.0–0.1)
Basophils Relative: 1 %
EOS ABS: 0.2 10*3/uL (ref 0.0–0.5)
EOS PCT: 2 %
HCT: 36.5 % (ref 34.8–46.6)
HEMOGLOBIN: 12 g/dL (ref 11.6–15.9)
LYMPHS ABS: 2.9 10*3/uL (ref 0.9–3.3)
Lymphocytes Relative: 31 %
MCH: 27.5 pg (ref 25.1–34.0)
MCHC: 32.8 g/dL (ref 31.5–36.0)
MCV: 83.8 fL (ref 79.5–101.0)
MONO ABS: 0.7 10*3/uL (ref 0.1–0.9)
MONOS PCT: 7 %
NEUTROS PCT: 59 %
Neutro Abs: 5.6 10*3/uL (ref 1.5–6.5)
Platelets: 366 10*3/uL (ref 145–400)
RBC: 4.35 MIL/uL (ref 3.70–5.45)
RDW: 15.3 % — ABNORMAL HIGH (ref 11.2–14.5)
WBC: 9.4 10*3/uL (ref 3.9–10.3)

## 2017-05-01 MED ORDER — IOPAMIDOL (ISOVUE-300) INJECTION 61%
INTRAVENOUS | Status: AC
  Filled 2017-05-01: qty 75

## 2017-05-01 MED ORDER — IOPAMIDOL (ISOVUE-300) INJECTION 61%
75.0000 mL | Freq: Once | INTRAVENOUS | Status: AC | PRN
  Administered 2017-05-01: 75 mL via INTRAVENOUS

## 2017-05-04 ENCOUNTER — Other Ambulatory Visit: Payer: Medicare HMO

## 2017-05-05 ENCOUNTER — Inpatient Hospital Stay (HOSPITAL_BASED_OUTPATIENT_CLINIC_OR_DEPARTMENT_OTHER): Payer: Medicare HMO | Admitting: Internal Medicine

## 2017-05-05 ENCOUNTER — Telehealth (HOSPITAL_COMMUNITY): Payer: Self-pay

## 2017-05-05 ENCOUNTER — Telehealth: Payer: Self-pay

## 2017-05-05 ENCOUNTER — Encounter: Payer: Self-pay | Admitting: Internal Medicine

## 2017-05-05 DIAGNOSIS — C3412 Malignant neoplasm of upper lobe, left bronchus or lung: Secondary | ICD-10-CM | POA: Diagnosis not present

## 2017-05-05 DIAGNOSIS — I700 Atherosclerosis of aorta: Secondary | ICD-10-CM

## 2017-05-05 DIAGNOSIS — J449 Chronic obstructive pulmonary disease, unspecified: Secondary | ICD-10-CM | POA: Diagnosis not present

## 2017-05-05 DIAGNOSIS — R093 Abnormal sputum: Secondary | ICD-10-CM | POA: Diagnosis not present

## 2017-05-05 DIAGNOSIS — R5383 Other fatigue: Secondary | ICD-10-CM

## 2017-05-05 DIAGNOSIS — C3490 Malignant neoplasm of unspecified part of unspecified bronchus or lung: Secondary | ICD-10-CM

## 2017-05-05 DIAGNOSIS — Z9221 Personal history of antineoplastic chemotherapy: Secondary | ICD-10-CM | POA: Diagnosis not present

## 2017-05-05 DIAGNOSIS — I10 Essential (primary) hypertension: Secondary | ICD-10-CM | POA: Diagnosis not present

## 2017-05-05 DIAGNOSIS — E119 Type 2 diabetes mellitus without complications: Secondary | ICD-10-CM

## 2017-05-05 DIAGNOSIS — Z9981 Dependence on supplemental oxygen: Secondary | ICD-10-CM

## 2017-05-05 DIAGNOSIS — Z79899 Other long term (current) drug therapy: Secondary | ICD-10-CM | POA: Diagnosis not present

## 2017-05-05 DIAGNOSIS — Z7982 Long term (current) use of aspirin: Secondary | ICD-10-CM | POA: Diagnosis not present

## 2017-05-05 NOTE — Telephone Encounter (Signed)
Attempted to call patient to reschedule orientation for Pulmonary Rehab - Patient was not available at the time. Not sure who answered the telephone but stated they would have patient return phone call.

## 2017-05-05 NOTE — Telephone Encounter (Signed)
Printed avs and calender of upcoming appointment. Per 3/26 los

## 2017-05-05 NOTE — Progress Notes (Signed)
[place] Telephone:([phone]   Fax:([phone]) [phone]  OFFICE PROGRESS NOTE  Nolene Ebbs, MD [place] [address]  DIAGNOSIS: Lung cancer  Staging form: Lung, AJCC 7th Edition  Clinical: Stage IIA (T2b, N0, M0) - Signed by [name], MD on 05/31/2014  PRIOR THERAPY:  1) Status post left upper lobectomy with lymph node dissection under the care of [name] on 05/04/2014. 2) Adjuvant chemotherapy with carboplatin for an AUC of 5 and paclitaxel 175 mg/m given every 3 weeks with Neulasta support. Status post 4 cycles. Last dose was given 08/26/2014.  CURRENT THERAPY: Observation.  INTERVAL HISTORY: [name] [name] 84 y.o. female returns to the clinic today for follow-up visit accompanied by her son.  The patient is feeling fine today with no specific complaints except for the persistent shortness of breath and she is currently on home oxygen.  She also has cough productive of yellowish sputum.  She was seen recently by Dr. Elsworth Soho and expected to have another appointment with him soon.  She denied having any chest pain or hemoptysis.  She denied having any fever or chills.  She has no nausea, vomiting, diarrhea or constipation.  The patient had repeat CT scan of the chest performed recently and she is here for evaluation and discussion of her scan results.   MEDICAL HISTORY: Past Medical History:  Diagnosis Date  . Allergic rhinitis   . Anxiety   . Arthritis   . Cancer ([place])   . COPD (chronic obstructive pulmonary disease) ([place])   . Diabetes mellitus without complication ([place])   . Emphysema of lung ([place])   . History of positive PPD, untreated   . HTN (hypertension)   . Palpitations   . Primary cancer of left upper lobe of lung ([place]) 05/23/2014   Stage IIA- T2b,N0- thoracoscopic left upper lobectomy 05/04/14  . SOB (shortness of breath)     ALLERGIES:  is allergic to darvon [propoxyphene hcl] and lipitor  [atorvastatin].  MEDICATIONS:  Current Outpatient Medications  Medication Sig Dispense Refill  . ALPRAZolam (XANAX) 0.5 MG tablet Take 0.5 mg by mouth 3 (three) times daily as needed for anxiety. Reported on 03/28/2015    . aspirin 81 MG tablet Take 81 mg by mouth daily.    Marland Kitchen esomeprazole (NEXIUM) 40 MG capsule Take 40 mg by mouth daily.    . fluorometholone (FML) 0.1 % ophthalmic suspension Place 1 drop into both eyes daily.    . Fluticasone-Umeclidin-Vilant (TRELEGY ELLIPTA) 100-62.5-25 MCG/INH AEPB Inhale 1 puff daily into the lungs. 1 each 6  . metoprolol tartrate (LOPRESSOR) 25 MG tablet Take 25 mg by mouth 2 (two) times daily.    . montelukast (SINGULAIR) 10 MG tablet Take 1 tablet (10 mg total) at bedtime by mouth. 30 tablet 11  . multivitamin-iron-minerals-folic acid (CENTRUM) chewable tablet Chew 1 tablet by mouth daily.    . traMADol (ULTRAM) 50 MG tablet Take 50 mg by mouth 2 (two) times daily as needed.    . valsartan-hydrochlorothiazide (DIOVAN-HCT) 160-25 MG tablet Take 1 tablet by mouth daily.    . VENTOLIN HFA 108 (90 BASE) MCG/ACT inhaler      No current facility-administered medications for this visit.     SURGICAL HISTORY:  Past Surgical History:  Procedure Laterality Date  . LEAD REMOVAL Left 05/04/2014   Procedure: CRYO INTERCOSTAL NERVE BLOCK;  Surgeon: [name], MD;  Location: [facility];  Service: Thoracic;  Laterality: Left;  . LUNG  LOBECTOMY  05/04/14   left upper lobectomy  . NODE DISSECTION Left 05/04/2014   Procedure: NODE DISSECTION;  Surgeon: [name], MD;  Location: [facility];  Service: Thoracic;  Laterality: Left;  Marland Kitchen VAGINAL HYSTERECTOMY  1966  . VIDEO ASSISTED THORACOSCOPY (VATS)/ LOBECTOMY Left 05/04/2014   Procedure: LEFT VIDEO ASSISTED THORACOSCOPY (VATS)/LEFT UPPER LOBECTOMY;  Surgeon: [name], MD;  Location: [facility];  Service: Thoracic;  Laterality: Left;    REVIEW OF SYSTEMS:  Constitutional: positive for fatigue Eyes:  negative Ears, nose, mouth, throat, and face: negative Respiratory: positive for cough, dyspnea on exertion and sputum Cardiovascular: negative Gastrointestinal: negative Genitourinary:negative Integument/breast: negative Hematologic/lymphatic: negative Musculoskeletal:negative Neurological: negative Behavioral/Psych: negative Endocrine: negative Allergic/Immunologic: negative   PHYSICAL EXAMINATION: General appearance: alert, cooperative, fatigued and no distress Head: Normocephalic, without obvious abnormality, atraumatic Neck: no adenopathy, no JVD, supple, symmetrical, trachea midline and thyroid not enlarged, symmetric, no tenderness/mass/nodules Lymph nodes: Cervical, supraclavicular, and axillary nodes normal. Resp: clear to auscultation bilaterally Back: symmetric, no curvature. ROM normal. No CVA tenderness. Cardio: regular rate and rhythm, S1, S2 normal, no murmur, click, rub or gallop GI: soft, non-tender; bowel sounds normal; no masses,  no organomegaly Extremities: extremities normal, atraumatic, no cyanosis or edema Neurologic: Alert and oriented X 3, normal strength and tone. Normal symmetric reflexes. Normal coordination and gait  ECOG PERFORMANCE STATUS: 1 - Symptomatic but completely ambulatory  Blood pressure (!) 179/84, pulse 79, temperature 98.2 F (36.8 C), temperature source Oral, resp. rate 20, height 5\' 1"  (1.549 m), weight 205 lb 3.2 oz (93.1 kg), SpO2 95 %.  LABORATORY DATA: Lab Results  Component Value Date   WBC 9.4 05/01/2017   HGB 12.0 05/01/2017   HCT 36.5 05/01/2017   MCV 83.8 05/01/2017   PLT 366 05/01/2017      Chemistry      Component Value Date/Time   NA 143 05/01/2017 0958   NA 144 01/05/2017 1017   K 4.1 05/01/2017 0958   K 3.8 01/05/2017 1017   CL 109 05/01/2017 0958   CO2 23 05/01/2017 0958   CO2 23 01/05/2017 1017   BUN 16 05/01/2017 0958   BUN 21.5 01/05/2017 1017   CREATININE 1.14 (H) 05/01/2017 0958   CREATININE 1.2 (H)  01/05/2017 1017      Component Value Date/Time   CALCIUM 9.8 05/01/2017 0958   CALCIUM 9.6 01/05/2017 1017   ALKPHOS 81 05/01/2017 0958   ALKPHOS 104 01/05/2017 1017   AST 25 05/01/2017 0958   AST 15 01/05/2017 1017   ALT 22 05/01/2017 0958   ALT 9 01/05/2017 1017   BILITOT 0.3 05/01/2017 0958   BILITOT 0.37 01/05/2017 1017       RADIOGRAPHIC STUDIES: Ct Chest W Contrast  Result Date: 05/01/2017 CLINICAL DATA:  Lung cancer, diagnosed February 2016, status post left upper lobectomy. Chemotherapy complete June 2016. EXAM: CT CHEST WITH CONTRAST TECHNIQUE: Multidetector CT imaging of the chest was performed during intravenous contrast administration. CONTRAST:  2mL ISOVUE-300 IOPAMIDOL (ISOVUE-300) INJECTION 61% COMPARISON:  01/05/2017 FINDINGS: Cardiovascular: Heart is normal in size.  No pericardial effusion. No evidence of thoracic aortic aneurysm. Atherosclerotic calcifications of the aortic root and arch. Three vessel coronary atherosclerosis. Mediastinum/Nodes: Small mediastinal lymph nodes measuring 8-9 mm short axis, grossly unchanged. 15 mm short axis right hilar node (series 2/image 65), similar. 20 mm short axis right infrahilar node (series 2/image 69), previously 12 mm. This appearance is worrisome for nodal recurrence. Visualized left thyroid is mildly heterogeneous/nodular. Lungs/Pleura: Status  post left upper lobectomy. Moderate centrilobular and paraseptal emphysematous changes. Scattered areas of bronchiectasis. Mild subpleural reticulation/fibrosis in the right lower lobe. No suspicious pulmonary nodules. No pleural effusion or pneumothorax. Upper Abdomen: Stable 1.5 cm right adrenal nodule. Small right renal cysts, measuring up to 1.7 cm in the medial right upper kidney, incompletely visualized. Musculoskeletal: Degenerative changes of the visualized thoracolumbar spine. IMPRESSION: Progressive right hilar/infrahilar nodes, measuring up to 2.0 cm short axis, worrisome for nodal  recurrence. Consider PET-CT for further evaluation. Status post left upper lobectomy. Aortic Atherosclerosis (ICD10-I70.0) and Emphysema (ICD10-J43.9). Electronically Signed   By: Julian Hy M.D.   On: 05/01/2017 15:58    ASSESSMENT AND PLAN:  This is a very pleasant 84 years old African-American female with a stage IIA non-small cell lung cancer status post left upper lobectomy with lymph node dissection followed by 4 cycles of adjuvant systemic chemotherapy with carboplatin and paclitaxel The patient has been on observation since July 2016. She had repeat CT scan of the chest performed recently.  I personally and independently reviewed the scan images and discussed the results with the patient and her son.  Her scan showed progressive right hilar and infrahilar nodes suspicious for disease recurrence. I recommended for the patient to have a PET scan for further evaluation of her disease and to rule out disease recurrence. For COPD, she will continue her current follow-up visit and evaluation by Dr. Elsworth Soho. For hypertension, I strongly encouraged the patient to take her medication as prescribed and to check it regularly at home and report to her primary care physician for adjustment of her medications. The patient will come back for follow-up visit in around 10 days for reevaluation and discussion of the PET scan results and further recommendation regarding her condition. She was advised to call immediately if she has any concerning symptoms in the interval. The patient voices understanding of current disease status and treatment options and is in agreement with the current care plan. All questions were answered. The patient knows to call the clinic with any problems, questions or concerns. We can certainly see the patient much sooner if necessary. I spent 15 minutes counseling the patient face to face. The total time spent in the appointment was 25 minutes.  Disclaimer: This note was dictated  with voice recognition software. Similar sounding words can inadvertently be transcribed and may not be corrected upon review.

## 2017-05-06 ENCOUNTER — Ambulatory Visit: Payer: Medicare HMO | Admitting: Internal Medicine

## 2017-05-07 ENCOUNTER — Telehealth (HOSPITAL_COMMUNITY): Payer: Self-pay

## 2017-05-07 NOTE — Telephone Encounter (Signed)
2nd attempt to call patient to reschedule orientation - I'm not sure who answered the telephone but stated patient is asleep and will give a call back.

## 2017-05-11 ENCOUNTER — Ambulatory Visit (HOSPITAL_COMMUNITY)
Admission: RE | Admit: 2017-05-11 | Discharge: 2017-05-11 | Disposition: A | Discharge status: Home / Self Care | Payer: Medicare HMO | Source: Ambulatory Visit | Attending: Internal Medicine | Admitting: Internal Medicine

## 2017-05-11 DIAGNOSIS — R590 Localized enlarged lymph nodes: Secondary | ICD-10-CM | POA: Diagnosis not present

## 2017-05-11 DIAGNOSIS — R9389 Abnormal findings on diagnostic imaging of other specified body structures: Secondary | ICD-10-CM | POA: Insufficient documentation

## 2017-05-11 DIAGNOSIS — Z79899 Other long term (current) drug therapy: Secondary | ICD-10-CM | POA: Diagnosis not present

## 2017-05-11 DIAGNOSIS — C3490 Malignant neoplasm of unspecified part of unspecified bronchus or lung: Secondary | ICD-10-CM | POA: Insufficient documentation

## 2017-05-11 DIAGNOSIS — R918 Other nonspecific abnormal finding of lung field: Secondary | ICD-10-CM | POA: Diagnosis not present

## 2017-05-11 DIAGNOSIS — K6289 Other specified diseases of anus and rectum: Secondary | ICD-10-CM | POA: Diagnosis not present

## 2017-05-11 LAB — GLUCOSE, CAPILLARY: Glucose-Capillary: 153 mg/dL — ABNORMAL HIGH (ref 65–99)

## 2017-05-11 MED ORDER — FLUDEOXYGLUCOSE F - 18 (FDG) INJECTION
10.1400 | Freq: Once | INTRAVENOUS | Status: AC | PRN
  Administered 2017-05-11: 10.14 via INTRAVENOUS

## 2017-05-14 ENCOUNTER — Inpatient Hospital Stay: Payer: Medicare HMO

## 2017-05-14 ENCOUNTER — Telehealth: Payer: Self-pay

## 2017-05-14 ENCOUNTER — Inpatient Hospital Stay: Payer: Medicare HMO | Attending: Oncology | Admitting: Oncology

## 2017-05-14 ENCOUNTER — Encounter: Payer: Self-pay | Admitting: Oncology

## 2017-05-14 VITALS — BP 155/56 | HR 87 | Temp 98.3°F | Resp 19 | Ht 61.0 in | Wt 203.8 lb

## 2017-05-14 DIAGNOSIS — M1990 Unspecified osteoarthritis, unspecified site: Secondary | ICD-10-CM | POA: Insufficient documentation

## 2017-05-14 DIAGNOSIS — C3412 Malignant neoplasm of upper lobe, left bronchus or lung: Secondary | ICD-10-CM

## 2017-05-14 DIAGNOSIS — E119 Type 2 diabetes mellitus without complications: Secondary | ICD-10-CM | POA: Diagnosis not present

## 2017-05-14 DIAGNOSIS — Z7189 Other specified counseling: Secondary | ICD-10-CM

## 2017-05-14 DIAGNOSIS — J449 Chronic obstructive pulmonary disease, unspecified: Secondary | ICD-10-CM | POA: Diagnosis not present

## 2017-05-14 DIAGNOSIS — Z9981 Dependence on supplemental oxygen: Secondary | ICD-10-CM | POA: Diagnosis not present

## 2017-05-14 DIAGNOSIS — C8332 Diffuse large B-cell lymphoma, intrathoracic lymph nodes: Secondary | ICD-10-CM | POA: Diagnosis not present

## 2017-05-14 DIAGNOSIS — R5383 Other fatigue: Secondary | ICD-10-CM | POA: Insufficient documentation

## 2017-05-14 DIAGNOSIS — Z79899 Other long term (current) drug therapy: Secondary | ICD-10-CM | POA: Diagnosis not present

## 2017-05-14 DIAGNOSIS — Z7982 Long term (current) use of aspirin: Secondary | ICD-10-CM | POA: Insufficient documentation

## 2017-05-14 DIAGNOSIS — Z902 Acquired absence of lung [part of]: Secondary | ICD-10-CM | POA: Diagnosis not present

## 2017-05-14 DIAGNOSIS — Z9221 Personal history of antineoplastic chemotherapy: Secondary | ICD-10-CM | POA: Diagnosis not present

## 2017-05-14 DIAGNOSIS — F17210 Nicotine dependence, cigarettes, uncomplicated: Secondary | ICD-10-CM | POA: Diagnosis not present

## 2017-05-14 DIAGNOSIS — I10 Essential (primary) hypertension: Secondary | ICD-10-CM

## 2017-05-14 MED ORDER — LORAZEPAM 0.5 MG PO TABS: ORAL_TABLET | ORAL | 0 refills | Status: DC

## 2017-05-14 NOTE — Telephone Encounter (Signed)
Scheduled lab add on and printed avs. Per 4/4 los

## 2017-05-14 NOTE — Progress Notes (Signed)
[facility] OFFICE PROGRESS NOTE  Nolene Ebbs, MD [place] [address]  DIAGNOSIS: Lung cancer  Staging form: Lung, AJCC 7th Edition  Clinical: Stage IIA (T2b, N0, M0) - Signed by [name], MD on 05/31/2014  PRIOR THERAPY: 1) Status post left upper lobectomy with lymph node dissection under the care of [name] on 05/04/2014. 2) Adjuvant chemotherapy with carboplatin for an AUC of 5 and paclitaxel 175 mg/m given every 3 weeks with Neulasta support. Status post 4 cycles. Last dose was given 08/26/2014.  CURRENT THERAPY: Observation.  INTERVAL HISTORY: [name] [name] 84 y.o. female returns for routine follow-up visit accompanied by her son and daughter.  Patient is feeling fine today with no specific complaints except for ongoing cough and shortness of breath.  The patient denies fevers and chills.  Denies chest pain and hemoptysis.  She wears home oxygen as needed.  Denies nausea, vomiting, constipation, diarrhea.  The patient had a PET scan and is here to discuss the results.  MEDICAL HISTORY: Past Medical History:  Diagnosis Date  . Allergic rhinitis   . Anxiety   . Arthritis   . Cancer ([place])   . COPD (chronic obstructive pulmonary disease) ([place])   . Diabetes mellitus without complication ([place])   . Emphysema of lung ([place])   . History of positive PPD, untreated   . HTN (hypertension)   . Palpitations   . Primary cancer of left upper lobe of lung ([place]) 05/23/2014   Stage IIA- T2b,N0- thoracoscopic left upper lobectomy 05/04/14  . SOB (shortness of breath)     ALLERGIES:  is allergic to darvon [propoxyphene hcl] and lipitor [atorvastatin].  MEDICATIONS:  Current Outpatient Medications  Medication Sig Dispense Refill  . ALPRAZolam (XANAX) 0.5 MG tablet Take 0.5 mg by mouth 3 (three) times daily as needed for anxiety. Reported on 03/28/2015    . aspirin 81 MG tablet Take 81 mg by mouth daily.    Marland Kitchen esomeprazole (NEXIUM) 40 MG  capsule Take 40 mg by mouth daily.    . fluorometholone (FML) 0.1 % ophthalmic suspension Place 1 drop into both eyes daily.    . Fluticasone-Umeclidin-Vilant (TRELEGY ELLIPTA) 100-62.5-25 MCG/INH AEPB Inhale 1 puff daily into the lungs. 1 each 6  . LORazepam (ATIVAN) 0.5 MG tablet Take 1 tab 1 hour prior to MRI. May repeat X1 dose. 2 tablet 0  . metoprolol tartrate (LOPRESSOR) 25 MG tablet Take 25 mg by mouth 2 (two) times daily.    . montelukast (SINGULAIR) 10 MG tablet Take 1 tablet (10 mg total) at bedtime by mouth. 30 tablet 11  . multivitamin-iron-minerals-folic acid (CENTRUM) chewable tablet Chew 1 tablet by mouth daily.    . traMADol (ULTRAM) 50 MG tablet Take 50 mg by mouth 2 (two) times daily as needed.    . valsartan-hydrochlorothiazide (DIOVAN-HCT) 160-25 MG tablet Take 1 tablet by mouth daily.    . VENTOLIN HFA 108 (90 BASE) MCG/ACT inhaler      No current facility-administered medications for this visit.     SURGICAL HISTORY:  Past Surgical History:  Procedure Laterality Date  . LEAD REMOVAL Left 05/04/2014   Procedure: CRYO INTERCOSTAL NERVE BLOCK;  Surgeon: [name], MD;  Location: [facility];  Service: Thoracic;  Laterality: Left;  . LUNG LOBECTOMY  05/04/14   left upper lobectomy  . NODE DISSECTION Left 05/04/2014   Procedure: NODE DISSECTION;  Surgeon: [name], MD;  Location: [facility];  Service: Thoracic;  Laterality: Left;  .  VAGINAL HYSTERECTOMY  1966  . VIDEO ASSISTED THORACOSCOPY (VATS)/ LOBECTOMY Left 05/04/2014   Procedure: LEFT VIDEO ASSISTED THORACOSCOPY (VATS)/LEFT UPPER LOBECTOMY;  Surgeon: [name], MD;  Location: [facility];  Service: Thoracic;  Laterality: Left;    REVIEW OF SYSTEMS:   Review of Systems  Constitutional: Negative for appetite change, chills, fatigue, fever and unexpected weight change.  HENT:   Negative for mouth sores, nosebleeds, sore throat and trouble swallowing.   Eyes: Negative for eye problems and icterus.   Respiratory: Negative for hemoptysis and wheezing.  Positive for cough and shortness of breath.  Cardiovascular: Negative for chest pain and leg swelling.  Gastrointestinal: Negative for abdominal pain, constipation, diarrhea, nausea and vomiting.  Genitourinary: Negative for bladder incontinence, difficulty urinating, dysuria, frequency and hematuria.   Musculoskeletal: Negative for back pain, gait problem, neck pain and neck stiffness.  Skin: Negative for itching and rash.  Neurological: Negative for dizziness, extremity weakness, gait problem, headaches, light-headedness and seizures.  Hematological: Negative for adenopathy. Does not bruise/bleed easily.  Psychiatric/Behavioral: Negative for confusion, depression and sleep disturbance. The patient is not nervous/anxious.     PHYSICAL EXAMINATION:  Blood pressure (!) 155/56, pulse 87, temperature 98.3 F (36.8 C), temperature source Oral, resp. rate 19, height 5\' 1"  (1.549 m), weight 203 lb 12.8 oz (92.4 kg), SpO2 97 %.  ECOG PERFORMANCE STATUS: 1 - Symptomatic but completely ambulatory  Physical Exam  Constitutional: Oriented to person, place, and time and well-developed, well-nourished, and in no distress. No distress.  HENT:  Head: Normocephalic and atraumatic.  Mouth/Throat: Oropharynx is clear and moist. No oropharyngeal exudate.  Eyes: Conjunctivae are normal. Right eye exhibits no discharge. Left eye exhibits no discharge. No scleral icterus.  Neck: Normal range of motion. Neck supple.  Cardiovascular: Normal rate, regular rhythm, normal heart sounds and intact distal pulses.   Pulmonary/Chest: Effort normal and breath sounds normal. No respiratory distress. No wheezes. No rales.  Abdominal: Soft. Bowel sounds are normal. Exhibits no distension and no mass. There is no tenderness.  Musculoskeletal: Normal range of motion. Exhibits no edema.  Lymphadenopathy:    No cervical adenopathy.  Neurological: Alert and oriented to  person, place, and time. Exhibits normal muscle tone. Gait normal. Coordination normal.  Skin: Skin is warm and dry. No rash noted. Not diaphoretic. No erythema. No pallor.  Psychiatric: Mood, memory and judgment normal.  Vitals reviewed.  LABORATORY DATA: Lab Results  Component Value Date   WBC 9.4 05/01/2017   HGB 12.0 05/01/2017   HCT 36.5 05/01/2017   MCV 83.8 05/01/2017   PLT 366 05/01/2017      Chemistry      Component Value Date/Time   NA 143 05/01/2017 0958   NA 144 01/05/2017 1017   K 4.1 05/01/2017 0958   K 3.8 01/05/2017 1017   CL 109 05/01/2017 0958   CO2 23 05/01/2017 0958   CO2 23 01/05/2017 1017   BUN 16 05/01/2017 0958   BUN 21.5 01/05/2017 1017   CREATININE 1.14 (H) 05/01/2017 0958   CREATININE 1.2 (H) 01/05/2017 1017      Component Value Date/Time   CALCIUM 9.8 05/01/2017 0958   CALCIUM 9.6 01/05/2017 1017   ALKPHOS 81 05/01/2017 0958   ALKPHOS 104 01/05/2017 1017   AST 25 05/01/2017 0958   AST 15 01/05/2017 1017   ALT 22 05/01/2017 0958   ALT 9 01/05/2017 1017   BILITOT 0.3 05/01/2017 0958   BILITOT 0.37 01/05/2017 1017  RADIOGRAPHIC STUDIES:  Ct Chest W Contrast  Result Date: 05/01/2017 CLINICAL DATA:  Lung cancer, diagnosed February 2016, status post left upper lobectomy. Chemotherapy complete June 2016. EXAM: CT CHEST WITH CONTRAST TECHNIQUE: Multidetector CT imaging of the chest was performed during intravenous contrast administration. CONTRAST:  14mL ISOVUE-300 IOPAMIDOL (ISOVUE-300) INJECTION 61% COMPARISON:  01/05/2017 FINDINGS: Cardiovascular: Heart is normal in size.  No pericardial effusion. No evidence of thoracic aortic aneurysm. Atherosclerotic calcifications of the aortic root and arch. Three vessel coronary atherosclerosis. Mediastinum/Nodes: Small mediastinal lymph nodes measuring 8-9 mm short axis, grossly unchanged. 15 mm short axis right hilar node (series 2/image 65), similar. 20 mm short axis right infrahilar node (series  2/image 69), previously 12 mm. This appearance is worrisome for nodal recurrence. Visualized left thyroid is mildly heterogeneous/nodular. Lungs/Pleura: Status post left upper lobectomy. Moderate centrilobular and paraseptal emphysematous changes. Scattered areas of bronchiectasis. Mild subpleural reticulation/fibrosis in the right lower lobe. No suspicious pulmonary nodules. No pleural effusion or pneumothorax. Upper Abdomen: Stable 1.5 cm right adrenal nodule. Small right renal cysts, measuring up to 1.7 cm in the medial right upper kidney, incompletely visualized. Musculoskeletal: Degenerative changes of the visualized thoracolumbar spine. IMPRESSION: Progressive right hilar/infrahilar nodes, measuring up to 2.0 cm short axis, worrisome for nodal recurrence. Consider PET-CT for further evaluation. Status post left upper lobectomy. Aortic Atherosclerosis (ICD10-I70.0) and Emphysema (ICD10-J43.9). Electronically Signed   By: Julian Hy M.D.   On: 05/01/2017 15:58   Nm Pet Image Restag (ps) Skull Base To Thigh  Result Date: 05/11/2017 CLINICAL DATA:  Subsequent treatment strategy for lung cancer. EXAM: NUCLEAR MEDICINE PET SKULL BASE TO THIGH TECHNIQUE: 10.14 mCi F-18 FDG was injected intravenously. Full-ring PET imaging was performed from the skull base to thigh after the radiotracer. CT data was obtained and used for attenuation correction and anatomic localization. Fasting blood glucose: 153 mg/dl COMPARISON:  PET-CT 03/29/2014 and chest CT 05/01/2017 FINDINGS: Mediastinal blood pool activity: SUV max 3.91 NECK: 5 mm supraclavicular lymph node on the left side adjacent to the thyroid gland on image number 43 is metabolically active with SUV max of 4.56. No other adenopathy. Mild thickening of the uvula is noted with some hypermetabolism. This may be inflammatory. No discrete mass identified and it appears similar to the prior PET-CT. Direct visualization is suggested. Incidental CT findings: none CHEST:  Right hilar adenopathy is hypermetabolic with SUV max of 84.1 consistent with recurrent lung cancer. Patchy areas of hypermetabolism are noted in the right lung which appear to correspond to areas of scarring and atelectasis. Vague nodular density in the right lower lobe on image number 71 measures 7 mm and is mildly hypermetabolic at SUV max of 3.0. 9 mm precarinal lymph node on image number 58 is hypermetabolic with SUV max of 4.0. 9 mm right axillary node on image number 51 is hypermetabolic with SUV max of 66.0. Incidental CT findings: Advanced emphysematous changes and dense pulmonary scarring. No obvious metastatic pulmonary nodules. Stable areas of bronchiectasis. ABDOMEN/PELVIS: No abnormal hypermetabolic activity within the liver, pancreas, adrenal glands, or spleen. No hypermetabolic lymph nodes in the abdomen or pelvis. Stable nodularity of both adrenal glands since the prior PET-CT consistent with benign adenomas. No hypermetabolism. There is a suspected soft tissue lesion involving the left side of the rectum. This measures 19 mm on image number 159 and is hypermetabolic with SUV max 6.30. Suspect colonic polyp. Correlation with sigmoidoscopy is suggested. Incidental CT findings: Severe atherosclerotic calcifications involving the aorta and iliac arteries. Advanced colonic  diverticulosis without findings for acute diverticulitis. Small periumbilical abdominal wall hernia, stable. SKELETON: No focal hypermetabolic activity to suggest skeletal metastasis. Incidental CT findings: none IMPRESSION: 1. Right hilar and mediastinal adenopathy demonstrating marked hypermetabolism and consistent with recurrent or new lung cancer. 2. Suspect 7 mm sub solid subpleural right lower lobe pulmonary lesion with mild hypermetabolism. Recommend surveillance. Other areas of patchy hypermetabolism in the right lung likely areas of inflammation. 3. No findings for recurrent left lung cancer. 4. Hypermetabolic left  supraclavicular and right axillary lymph nodes 5. Hypermetabolism in the region of the uvula. Direct visualization suggested. 6. Solid-appearing lesion in the left rectum suspicious for a polyp. Moderate hypermetabolism. Recommend proctoscopic evaluation. 7. Stable bilateral adrenal gland nodules, likely benign adenomas. Electronically Signed   By: Marijo Sanes M.D.   On: 05/11/2017 10:24     ASSESSMENT/PLAN:  Primary cancer of left upper lobe of lung [facility]) This is a very pleasant 84 year old African-American female with a stage IIA non-small cell lung cancer status post left upper lobectomy with lymph node dissection followed by 4 cycles of adjuvant systemic chemotherapy with carboplatin and paclitaxel The patient has been on observation since July 2016. She had repeat CT scan of the chest performed recently which showed progressive right hilar and infrahilar nodes suspicious for disease recurrence. She had a recent PET scan and is here to discuss the results.  The PET scan results and images were reviewed with the patient and her family.  Unfortunately, the PET scan shows right hilar and mediastinal adenopathy with marked hypermetabolism consistent with cancer.  Additionally, there is a left supraclavicular and right axillary lymph nodes that were hypermetabolic on the PET scan.  There is hypermetabolism in the region of the uvula.  There is also a solid appearing lesion in the left rectum suspicious for a polyp.  Discussed with the patient and her family this could represent a new lung cancer versus another primary cancer.  Recommend that she have a CA 27.29 drawn today.  She has not had a mammogram many years.  Also recommend that she undergo an ultrasound-guided biopsy of the right axillary lymph node in interventional radiology.  She will have an MRI of the brain to evaluate for metastatic disease.  The patient and her family had many questions which were answered.  Additional discussion  regarding her diagnosis, prognosis, and treatment options will occur after we have the biopsy results. Follow-up will be scheduled once we know the day of her biopsy.  For COPD, she will continue her current follow-up visit and evaluation by Dr. Elsworth Soho.  For hypertension, I strongly encouraged the patient to take her medication as prescribed and to check it regularly at home and report to her primary care physician for adjustment of her medications.  She was advised to call immediately if she has any concerning symptoms in the interval. The patient voices understanding of current disease status and treatment options and is in agreement with the current care plan. All questions were answered. The patient knows to call the clinic with any problems, questions or concerns. We can certainly see the patient much sooner if necessary.   Orders Placed This Encounter  Procedures  . MR Brain W Wo Contrast    Standing Status:   Future    Standing Expiration Date:   05/14/2018    Order Specific Question:   If indicated for the ordered procedure, I authorize the administration of contrast media per Radiology protocol    Answer:  Yes    Order Specific Question:   What is the patient's sedation requirement?    Answer:   Anti-anxiety    Order Specific Question:   Does the patient have a pacemaker or implanted devices?    Answer:   No    Order Specific Question:   Radiology Contrast Protocol - do NOT remove file path    Answer:   [email or web address]    Order Specific Question:   Reason for Exam additional comments    Answer:   Recurrent lung cancer. Eval for brain mets.    Order Specific Question:   Preferred imaging location?    Answer:   [facility] (table limit-350 lbs)  . [name] CORE BIOPSY (LYMPH NODES)    Standing Status:   Future    Standing Expiration Date:   07/15/2018    Order Specific Question:   Lab orders requested (DO NOT place separate lab orders, these will be  automatically ordered during procedure specimen collection):    Answer:   Surgical Pathology    Order Specific Question:   Reason for Exam (SYMPTOM  OR DIAGNOSIS REQUIRED)    Answer:   Lung cancer. Recurrence.U/S guided core biopsy of R axillary lymph node.    Order Specific Question:   Preferred imaging location?    Answer:   [facility]  . CA 27.29    Standing Status:   Future    Number of Occurrences:   1    Standing Expiration Date:   05/14/2018   Mikey Bussing, DNP, AGPCNP-BC, AOCNP 05/14/17    ADDENDUM: Hematology/Oncology Attending: I had a face-to-face encounter with the patient.  I recommended her care plan.  This is a very pleasant 84 years old African-American female with history of a stage IIa non-small cell lung cancer status post left upper lobectomy with lymph node dissection followed by adjuvant systemic chemotherapy with carboplatin and paclitaxel.  Unfortunately the patient was found recently to have evidence for disease recurrence with progressive right hilar and infrahilar lymph nodes.  This was confirmed by a PET scan to be hypermetabolic.  The PET scan also showed evidence for left supraclavicular and right axillary lymphadenopathy.  This is suspicious for metastatic disease. I personally and independently reviewed the scan images and discussed the results and showed the images to the patient and her family.  I recommended for the patient to proceed with ultrasound-guided core biopsy of the right axillary lymph node for confirmation of tissue diagnosis. We will also order a MRI of the brain to rule out brain metastasis. I will arrange for the patient to come back for follow-up visit in around 1-2 weeks for reevaluation and more detailed discussion of her treatment options based on the biopsy results. For COPD, the patient will continue her routine follow-up visit and evaluation by Dr. Elsworth Soho. The patient and her family had several questions and answers them  completely to their satisfaction. She was advised to call immediately if she has any concerning symptoms in the interval.  Disclaimer: This note was dictated with voice recognition software. Similar sounding words can inadvertently be transcribed and may be missed upon review. Eilleen Kempf, MD 05/16/17

## 2017-05-14 NOTE — Assessment & Plan Note (Signed)
This is a very pleasant 84 year old African-American female with a stage IIA non-small cell lung cancer status post left upper lobectomy with lymph node dissection followed by 4 cycles of adjuvant systemic chemotherapy with carboplatin and paclitaxel The patient has been on observation since July 2016. She had repeat CT scan of the chest performed recently which showed progressive right hilar and infrahilar nodes suspicious for disease recurrence. She had a recent PET scan and is here to discuss the results.  The PET scan results and images were reviewed with the patient and her family.  Unfortunately, the PET scan shows right hilar and mediastinal adenopathy with marked hypermetabolism consistent with cancer.  Additionally, there is a left supraclavicular and right axillary lymph nodes that were hypermetabolic on the PET scan.  There is hypermetabolism in the region of the uvula.  There is also a solid appearing lesion in the left rectum suspicious for a polyp.  Discussed with the patient and her family this could represent a new lung cancer versus another primary cancer.  Recommend that she have a CA 27.29 drawn today.  She has not had a mammogram many years.  Also recommend that she undergo an ultrasound-guided biopsy of the right axillary lymph node in interventional radiology.  She will have an MRI of the brain to evaluate for metastatic disease.  The patient and her family had many questions which were answered.  Additional discussion regarding her diagnosis, prognosis, and treatment options will occur after we have the biopsy results. Follow-up will be scheduled once we know the day of her biopsy.  For COPD, she will continue her current follow-up visit and evaluation by Dr. Elsworth Soho.  For hypertension, I strongly encouraged the patient to take her medication as prescribed and to check it regularly at home and report to her primary care physician for adjustment of her medications.  She was advised  to call immediately if she has any concerning symptoms in the interval. The patient voices understanding of current disease status and treatment options and is in agreement with the current care plan. All questions were answered. The patient knows to call the clinic with any problems, questions or concerns. We can certainly see the patient much sooner if necessary.

## 2017-05-15 ENCOUNTER — Telehealth: Payer: Self-pay | Admitting: Oncology

## 2017-05-15 LAB — CANCER ANTIGEN 27.29: CAN 27.29: 58.7 U/mL — AB (ref 0.0–38.6)

## 2017-05-15 NOTE — Telephone Encounter (Signed)
Scheduled appt per 4/5 sch message - patient is aware of appt date and time.

## 2017-05-19 ENCOUNTER — Other Ambulatory Visit: Payer: Self-pay | Admitting: Radiology

## 2017-05-20 ENCOUNTER — Ambulatory Visit (HOSPITAL_COMMUNITY)
Admission: RE | Admit: 2017-05-20 | Discharge: 2017-05-20 | Disposition: A | Discharge status: Home / Self Care | Payer: Medicare HMO | Source: Ambulatory Visit | Attending: Oncology | Admitting: Oncology

## 2017-05-20 ENCOUNTER — Encounter (HOSPITAL_COMMUNITY): Payer: Self-pay

## 2017-05-20 DIAGNOSIS — Z79899 Other long term (current) drug therapy: Secondary | ICD-10-CM | POA: Diagnosis not present

## 2017-05-20 DIAGNOSIS — Z7951 Long term (current) use of inhaled steroids: Secondary | ICD-10-CM | POA: Diagnosis not present

## 2017-05-20 DIAGNOSIS — Z8049 Family history of malignant neoplasm of other genital organs: Secondary | ICD-10-CM | POA: Insufficient documentation

## 2017-05-20 DIAGNOSIS — J439 Emphysema, unspecified: Secondary | ICD-10-CM | POA: Diagnosis not present

## 2017-05-20 DIAGNOSIS — Z90710 Acquired absence of both cervix and uterus: Secondary | ICD-10-CM | POA: Diagnosis not present

## 2017-05-20 DIAGNOSIS — Z87891 Personal history of nicotine dependence: Secondary | ICD-10-CM | POA: Diagnosis not present

## 2017-05-20 DIAGNOSIS — C3412 Malignant neoplasm of upper lobe, left bronchus or lung: Secondary | ICD-10-CM

## 2017-05-20 DIAGNOSIS — Z801 Family history of malignant neoplasm of trachea, bronchus and lung: Secondary | ICD-10-CM | POA: Insufficient documentation

## 2017-05-20 DIAGNOSIS — I10 Essential (primary) hypertension: Secondary | ICD-10-CM | POA: Insufficient documentation

## 2017-05-20 DIAGNOSIS — C8334 Diffuse large B-cell lymphoma, lymph nodes of axilla and upper limb: Secondary | ICD-10-CM | POA: Insufficient documentation

## 2017-05-20 DIAGNOSIS — F419 Anxiety disorder, unspecified: Secondary | ICD-10-CM | POA: Diagnosis not present

## 2017-05-20 DIAGNOSIS — Z9221 Personal history of antineoplastic chemotherapy: Secondary | ICD-10-CM | POA: Diagnosis not present

## 2017-05-20 DIAGNOSIS — Z902 Acquired absence of lung [part of]: Secondary | ICD-10-CM | POA: Insufficient documentation

## 2017-05-20 DIAGNOSIS — Z8042 Family history of malignant neoplasm of prostate: Secondary | ICD-10-CM | POA: Insufficient documentation

## 2017-05-20 DIAGNOSIS — R590 Localized enlarged lymph nodes: Secondary | ICD-10-CM | POA: Diagnosis present

## 2017-05-20 DIAGNOSIS — M1990 Unspecified osteoarthritis, unspecified site: Secondary | ICD-10-CM | POA: Diagnosis not present

## 2017-05-20 DIAGNOSIS — Z8249 Family history of ischemic heart disease and other diseases of the circulatory system: Secondary | ICD-10-CM | POA: Insufficient documentation

## 2017-05-20 DIAGNOSIS — Z85118 Personal history of other malignant neoplasm of bronchus and lung: Secondary | ICD-10-CM | POA: Diagnosis not present

## 2017-05-20 DIAGNOSIS — Z888 Allergy status to other drugs, medicaments and biological substances status: Secondary | ICD-10-CM | POA: Diagnosis not present

## 2017-05-20 DIAGNOSIS — E119 Type 2 diabetes mellitus without complications: Secondary | ICD-10-CM | POA: Insufficient documentation

## 2017-05-20 LAB — GLUCOSE, CAPILLARY: GLUCOSE-CAPILLARY: 100 mg/dL — AB (ref 65–99)

## 2017-05-20 LAB — PROTIME-INR
INR: 0.95
Prothrombin Time: 12.6 seconds (ref 11.4–15.2)

## 2017-05-20 LAB — CBC
HEMATOCRIT: 36.1 % (ref 36.0–46.0)
Hemoglobin: 11.7 g/dL — ABNORMAL LOW (ref 12.0–15.0)
MCH: 27 pg (ref 26.0–34.0)
MCHC: 32.4 g/dL (ref 30.0–36.0)
MCV: 83.2 fL (ref 78.0–100.0)
Platelets: 292 10*3/uL (ref 150–400)
RBC: 4.34 MIL/uL (ref 3.87–5.11)
RDW: 16.1 % — ABNORMAL HIGH (ref 11.5–15.5)
WBC: 9.3 10*3/uL (ref 4.0–10.5)

## 2017-05-20 MED ORDER — SODIUM CHLORIDE 0.9 % IV SOLN
INTRAVENOUS | Status: DC | PRN
  Administered 2017-05-20: 10 mL/h via INTRAVENOUS

## 2017-05-20 MED ORDER — FENTANYL CITRATE (PF) 100 MCG/2ML IJ SOLN
INTRAMUSCULAR | Status: DC | PRN
  Administered 2017-05-20: 25 ug via INTRAVENOUS

## 2017-05-20 MED ORDER — SODIUM CHLORIDE 0.9 % IV SOLN: INTRAVENOUS | Status: DC

## 2017-05-20 MED ORDER — LIDOCAINE HCL (PF) 1 % IJ SOLN
INTRAMUSCULAR | Status: AC
  Filled 2017-05-20: qty 30

## 2017-05-20 MED ORDER — MIDAZOLAM HCL 2 MG/2ML IJ SOLN
INTRAMUSCULAR | Status: DC | PRN
  Administered 2017-05-20: 1 mg via INTRAVENOUS
  Administered 2017-05-20: 0.5 mg via INTRAVENOUS

## 2017-05-20 MED ORDER — MIDAZOLAM HCL 2 MG/2ML IJ SOLN
INTRAMUSCULAR | Status: AC
  Filled 2017-05-20: qty 2

## 2017-05-20 MED ORDER — FENTANYL CITRATE (PF) 100 MCG/2ML IJ SOLN
INTRAMUSCULAR | Status: AC
  Filled 2017-05-20: qty 2

## 2017-05-20 NOTE — Procedures (Signed)
Interventional Radiology Procedure Note  Procedure: US guided bx right axillary LN.   Complications: None  Estimated Blood Loss: None  Recommendations: - DC home  Signed,  Criselda Peaches, MD

## 2017-05-20 NOTE — Sedation Documentation (Signed)
Patient is resting comfortably. 

## 2017-05-20 NOTE — Discharge Instructions (Addendum)

## 2017-05-20 NOTE — H&P (Signed)
Chief Complaint: Recurrent non-small cell lung cancer  Referring Physician(s): [place] R  Supervising Physician: Jacqulynn Cadet  Patient Status: [facility] - Out-pt  History of Present Illness: [name] [name] is a 84 y.o. female with stage IIA non-small cell lung cancer.  Treatment included a left upper lobectomy with lymph node dissection followed by 4 cycles of adjuvant systemic chemotherapy with carboplatin and paclitaxel.  The patient has been on observation since July 2016.  She had repeat CT scan of the chest performed recently which showed progressive right hilar and infrahilar nodes suspicious for disease recurrence.  She had a recent PET scan which shows right hilar and mediastinal adenopathy with marked hypermetabolism consistent with cancer.    There is a left supraclavicular and right axillary lymph nodes that is hypermetabolic on the PET scan and we are asked to biopsy this.    Past Medical History:  Diagnosis Date  . Allergic rhinitis   . Anxiety   . Arthritis   . Cancer ([place])   . COPD (chronic obstructive pulmonary disease) ([place])   . Diabetes mellitus without complication ([place])   . Emphysema of lung ([place])   . History of positive PPD, untreated   . HTN (hypertension)   . Palpitations   . Primary cancer of left upper lobe of lung ([place]) 05/23/2014   Stage IIA- T2b,N0- thoracoscopic left upper lobectomy 05/04/14  . SOB (shortness of breath)     Past Surgical History:  Procedure Laterality Date  . LEAD REMOVAL Left 05/04/2014   Procedure: CRYO INTERCOSTAL NERVE BLOCK;  Surgeon: [name], MD;  Location: [place];  Service: Thoracic;  Laterality: Left;  . LUNG LOBECTOMY  05/04/14   left upper lobectomy  . NODE DISSECTION Left 05/04/2014   Procedure: NODE DISSECTION;  Surgeon: [name], MD;  Location: [place];  Service: Thoracic;  Laterality: Left;  Marland Kitchen VAGINAL HYSTERECTOMY  1966  . VIDEO ASSISTED THORACOSCOPY (VATS)/ LOBECTOMY Left  05/04/2014   Procedure: LEFT VIDEO ASSISTED THORACOSCOPY (VATS)/LEFT UPPER LOBECTOMY;  Surgeon: [name], MD;  Location: [place];  Service: Thoracic;  Laterality: Left;    Allergies: Darvon [propoxyphene hcl] and Lipitor [atorvastatin]  Medications: Prior to Admission medications   Medication Sig Start Date End Date Taking? Authorizing Provider  ALPRAZolam Duanne Moron) 0.5 MG tablet Take 0.5 mg by mouth 3 (three) times daily as needed for anxiety. Reported on 03/28/2015   Yes [provider]  esomeprazole (NEXIUM) 40 MG capsule Take 40 mg by mouth daily as needed.  09/18/15  Yes [provider]  fluorometholone (FML) 0.1 % ophthalmic suspension Place 1 drop into both eyes daily. 09/14/15  Yes [provider]  Fluticasone-Umeclidin-Vilant (TRELEGY ELLIPTA) 100-62.5-25 MCG/INH AEPB Inhale 1 puff daily into the lungs. 12/26/16  Yes Javier Glazier, MD  metoprolol tartrate (LOPRESSOR) 25 MG tablet Take 25 mg by mouth 2 (two) times daily.   Yes [provider]  montelukast (SINGULAIR) 10 MG tablet Take 1 tablet (10 mg total) at bedtime by mouth. Patient taking differently: Take 10 mg by mouth at bedtime as needed.  12/26/16  Yes Javier Glazier, MD  multivitamin-iron-minerals-folic acid (CENTRUM) chewable tablet Chew 1 tablet by mouth daily.   Yes [provider]  VENTOLIN HFA 108 (90 BASE) MCG/ACT inhaler Inhale 1-2 puffs into the lungs every 4 (four) hours as needed for shortness of breath.  10/31/14  Yes [provider]  traMADol (ULTRAM) 50 MG tablet Take 50 mg by mouth 2 (two) times  daily as needed. 09/10/15   [provider]     Family History  Problem Relation Age of Onset  . Cervical cancer Mother   . Prostate cancer Father   . Heart disease Father   . Stroke Maternal Grandmother   . Angina Maternal Grandfather   . Prostate cancer Paternal Uncle   . Lung cancer Brother   . Lung disease Neg Hx     Social History    Socioeconomic History  . Marital status: Widowed    Spouse name: Not on file  . Number of children: Not on file  . Years of education: Not on file  . Highest education level: Not on file  Occupational History  . Occupation: retired  Scientific laboratory technician  . Financial resource strain: Not on file  . Food insecurity:    Worry: Not on file    Inability: Not on file  . Transportation needs:    Medical: Not on file    Non-medical: Not on file  Tobacco Use  . Smoking status: Former Smoker    Packs/day: 0.75    Years: 70.00    Pack years: 52.50    Types: Cigarettes    Start date: 01/02/1945    Last attempt to quit: 05/03/2014    Years since quitting: 3.0  . Smokeless tobacco: Former Systems developer  . Tobacco comment: Quit 05/03/14  Substance and Sexual Activity  . Alcohol use: No    Alcohol/week: 0.0 oz  . Drug use: No  . Sexual activity: Not on file  Lifestyle  . Physical activity:    Days per week: Not on file    Minutes per session: Not on file  . Stress: Not on file  Relationships  . Social connections:    Talks on phone: Not on file    Gets together: Not on file    Attends religious service: Not on file    Active member of club or organization: Not on file    Attends meetings of clubs or organizations: Not on file    Relationship status: Not on file  Other Topics Concern  . Not on file  Social History Narrative   [facility] (11/17/16):   Originally from [facility]. Previously lived in [place]. 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 No treatment but did get monitored with CXR. Currently has a Proofreader for 10 years and a dog. No mold exposure. She lives alone in an apartment. No hobbies currently.     Review of Systems: A 12 point ROS discussed and pertinent positives are indicated in the HPI above.  All other systems are negative. Review of Systems  Vital Signs: BP (!) 156/69   Pulse 72   Temp 97.8 F (36.6 C) (Oral)   Resp  16   Ht 5\' 1"  (1.549 m)   Wt 205 lb (93 kg)   SpO2 95%   BMI 38.73 kg/m   Physical Exam  Constitutional: She is oriented to person, place, and time. She appears well-developed.  HENT:  Head: Normocephalic and atraumatic.  Eyes: EOM are normal.  Neck: Normal range of motion.  Cardiovascular: Normal rate, regular rhythm and normal heart sounds.  Pulmonary/Chest: Effort normal and breath sounds normal.  Abdominal: She exhibits no distension.  Musculoskeletal: Normal range of motion.  Neurological: She is alert and oriented to person, place, and time.  Skin: Skin is warm and dry.  Psychiatric: She has a normal mood  and affect. Her behavior is normal. Judgment and thought content normal.  Vitals reviewed.   Imaging: Ct Chest W Contrast  Result Date: 05/01/2017 CLINICAL DATA:  Lung cancer, diagnosed February 2016, status post left upper lobectomy. Chemotherapy complete June 2016. EXAM: CT CHEST WITH CONTRAST TECHNIQUE: Multidetector CT imaging of the chest was performed during intravenous contrast administration. CONTRAST:  52mL ISOVUE-300 IOPAMIDOL (ISOVUE-300) INJECTION 61% COMPARISON:  01/05/2017 FINDINGS: Cardiovascular: Heart is normal in size.  No pericardial effusion. No evidence of thoracic aortic aneurysm. Atherosclerotic calcifications of the aortic root and arch. Three vessel coronary atherosclerosis. Mediastinum/Nodes: Small mediastinal lymph nodes measuring 8-9 mm short axis, grossly unchanged. 15 mm short axis right hilar node (series 2/image 65), similar. 20 mm short axis right infrahilar node (series 2/image 69), previously 12 mm. This appearance is worrisome for nodal recurrence. Visualized left thyroid is mildly heterogeneous/nodular. Lungs/Pleura: Status post left upper lobectomy. Moderate centrilobular and paraseptal emphysematous changes. Scattered areas of bronchiectasis. Mild subpleural reticulation/fibrosis in the right lower lobe. No suspicious pulmonary nodules. No  pleural effusion or pneumothorax. Upper Abdomen: Stable 1.5 cm right adrenal nodule. Small right renal cysts, measuring up to 1.7 cm in the medial right upper kidney, incompletely visualized. Musculoskeletal: Degenerative changes of the visualized thoracolumbar spine. IMPRESSION: Progressive right hilar/infrahilar nodes, measuring up to 2.0 cm short axis, worrisome for nodal recurrence. Consider PET-CT for further evaluation. Status post left upper lobectomy. Aortic Atherosclerosis (ICD10-I70.0) and Emphysema (ICD10-J43.9). Electronically Signed   By: Julian Hy M.D.   On: 05/01/2017 15:58   Nm Pet Image Restag (ps) Skull Base To Thigh  Result Date: 05/11/2017 CLINICAL DATA:  Subsequent treatment strategy for lung cancer. EXAM: NUCLEAR MEDICINE PET SKULL BASE TO THIGH TECHNIQUE: 10.14 mCi F-18 FDG was injected intravenously. Full-ring PET imaging was performed from the skull base to thigh after the radiotracer. CT data was obtained and used for attenuation correction and anatomic localization. Fasting blood glucose: 153 mg/dl COMPARISON:  PET-CT 03/29/2014 and chest CT 05/01/2017 FINDINGS: Mediastinal blood pool activity: SUV max 3.91 NECK: 5 mm supraclavicular lymph node on the left side adjacent to the thyroid gland on image number 43 is metabolically active with SUV max of 4.56. No other adenopathy. Mild thickening of the uvula is noted with some hypermetabolism. This may be inflammatory. No discrete mass identified and it appears similar to the prior PET-CT. Direct visualization is suggested. Incidental CT findings: none CHEST: Right hilar adenopathy is hypermetabolic with SUV max of 69.4 consistent with recurrent lung cancer. Patchy areas of hypermetabolism are noted in the right lung which appear to correspond to areas of scarring and atelectasis. Vague nodular density in the right lower lobe on image number 71 measures 7 mm and is mildly hypermetabolic at SUV max of 3.0. 9 mm precarinal lymph node  on image number 58 is hypermetabolic with SUV max of 4.0. 9 mm right axillary node on image number 51 is hypermetabolic with SUV max of 85.4. Incidental CT findings: Advanced emphysematous changes and dense pulmonary scarring. No obvious metastatic pulmonary nodules. Stable areas of bronchiectasis. ABDOMEN/PELVIS: No abnormal hypermetabolic activity within the liver, pancreas, adrenal glands, or spleen. No hypermetabolic lymph nodes in the abdomen or pelvis. Stable nodularity of both adrenal glands since the prior PET-CT consistent with benign adenomas. No hypermetabolism. There is a suspected soft tissue lesion involving the left side of the rectum. This measures 19 mm on image number 159 and is hypermetabolic with SUV max 6.27. Suspect colonic polyp. Correlation with sigmoidoscopy is suggested.  Incidental CT findings: Severe atherosclerotic calcifications involving the aorta and iliac arteries. Advanced colonic diverticulosis without findings for acute diverticulitis. Small periumbilical abdominal wall hernia, stable. SKELETON: No focal hypermetabolic activity to suggest skeletal metastasis. Incidental CT findings: none IMPRESSION: 1. Right hilar and mediastinal adenopathy demonstrating marked hypermetabolism and consistent with recurrent or new lung cancer. 2. Suspect 7 mm sub solid subpleural right lower lobe pulmonary lesion with mild hypermetabolism. Recommend surveillance. Other areas of patchy hypermetabolism in the right lung likely areas of inflammation. 3. No findings for recurrent left lung cancer. 4. Hypermetabolic left supraclavicular and right axillary lymph nodes 5. Hypermetabolism in the region of the uvula. Direct visualization suggested. 6. Solid-appearing lesion in the left rectum suspicious for a polyp. Moderate hypermetabolism. Recommend proctoscopic evaluation. 7. Stable bilateral adrenal gland nodules, likely benign adenomas. Electronically Signed   By: Marijo Sanes M.D.   On: 05/11/2017  10:24    Labs:  CBC: Recent Labs    10/03/16 1056 01/05/17 1017 05/01/17 0958 05/20/17 1117  WBC 7.9 9.4 9.4 9.3  HGB 12.3 11.6 12.0 11.7*  HCT 37.5 35.4 36.5 36.1  PLT 285 245 366 292    COAGS: Recent Labs    05/20/17 1117  INR 0.95    BMP: Recent Labs    10/03/16 1056 01/05/17 1017 05/01/17 0958  NA 143 144 143  K 4.0 3.8 4.1  CL  --   --  109  CO2 23 23 23   GLUCOSE 107 116 133  BUN 17.9 21.5 16  CALCIUM 9.8 9.6 9.8  CREATININE 1.1 1.2* 1.14*  GFRNONAA  --   --  44*  GFRAA  --   --  51*    LIVER FUNCTION TESTS: Recent Labs    10/03/16 1056 01/05/17 1017 05/01/17 0958  BILITOT 0.30 0.37 0.3  AST 18 15 25   ALT 15 9 22   ALKPHOS 81 104 81  PROT 7.4 7.9 8.1  ALBUMIN 3.5 3.3* 3.4*    TUMOR MARKERS: No results for input(s): AFPTM, CEA, CA199, CHROMGRNA in the last 8760 hours.  Assessment and Plan:  Recurrent non-small cell lung cancer  Hypermetabolic right axillary lymph node.  Will proceed with image guided right axillary lymph node biopsy.  Thank you for this interesting consult.  I greatly enjoyed meeting Meekah Math and look forward to participating in their care.  A copy of this report was sent to the requesting provider on this date.  Electronically Signed: Murrell Redden, PA-C 05/20/2017, 12:56 PM   I spent a total of  30 Minutes in face to face in clinical consultation, greater than 50% of which was counseling/coordinating care for lymph node biopsy.

## 2017-05-25 ENCOUNTER — Ambulatory Visit (HOSPITAL_COMMUNITY)
Admission: RE | Admit: 2017-05-25 | Discharge: 2017-05-25 | Disposition: A | Discharge status: Home / Self Care | Payer: Medicare HMO | Source: Ambulatory Visit | Attending: Oncology | Admitting: Oncology

## 2017-05-25 DIAGNOSIS — C3412 Malignant neoplasm of upper lobe, left bronchus or lung: Secondary | ICD-10-CM | POA: Diagnosis not present

## 2017-05-25 DIAGNOSIS — G319 Degenerative disease of nervous system, unspecified: Secondary | ICD-10-CM | POA: Diagnosis not present

## 2017-05-25 MED ORDER — GADOBENATE DIMEGLUMINE 529 MG/ML IV SOLN
20.0000 mL | Freq: Once | INTRAVENOUS | Status: AC | PRN
  Administered 2017-05-25: 19 mL via INTRAVENOUS

## 2017-05-28 ENCOUNTER — Telehealth: Payer: Self-pay

## 2017-05-28 ENCOUNTER — Other Ambulatory Visit: Payer: Self-pay | Admitting: *Deleted

## 2017-05-28 ENCOUNTER — Encounter: Payer: Self-pay | Admitting: Oncology

## 2017-05-28 ENCOUNTER — Other Ambulatory Visit: Payer: Self-pay

## 2017-05-28 ENCOUNTER — Inpatient Hospital Stay: Payer: Medicare HMO

## 2017-05-28 ENCOUNTER — Other Ambulatory Visit: Payer: Self-pay | Admitting: Internal Medicine

## 2017-05-28 ENCOUNTER — Telehealth (HOSPITAL_COMMUNITY): Payer: Self-pay

## 2017-05-28 ENCOUNTER — Other Ambulatory Visit: Payer: Self-pay | Admitting: Oncology

## 2017-05-28 ENCOUNTER — Telehealth: Payer: Self-pay | Admitting: *Deleted

## 2017-05-28 ENCOUNTER — Inpatient Hospital Stay (HOSPITAL_BASED_OUTPATIENT_CLINIC_OR_DEPARTMENT_OTHER): Payer: Medicare HMO | Admitting: Oncology

## 2017-05-28 VITALS — BP 173/75 | HR 82 | Temp 98.5°F | Resp 18 | Ht 61.0 in | Wt 207.9 lb

## 2017-05-28 DIAGNOSIS — Z7189 Other specified counseling: Secondary | ICD-10-CM | POA: Insufficient documentation

## 2017-05-28 DIAGNOSIS — Z5111 Encounter for antineoplastic chemotherapy: Secondary | ICD-10-CM | POA: Insufficient documentation

## 2017-05-28 DIAGNOSIS — Z79899 Other long term (current) drug therapy: Secondary | ICD-10-CM

## 2017-05-28 DIAGNOSIS — C3412 Malignant neoplasm of upper lobe, left bronchus or lung: Secondary | ICD-10-CM

## 2017-05-28 DIAGNOSIS — J449 Chronic obstructive pulmonary disease, unspecified: Secondary | ICD-10-CM | POA: Diagnosis not present

## 2017-05-28 DIAGNOSIS — C8299 Follicular lymphoma, unspecified, extranodal and solid organ sites: Secondary | ICD-10-CM

## 2017-05-28 DIAGNOSIS — Z9221 Personal history of antineoplastic chemotherapy: Secondary | ICD-10-CM | POA: Diagnosis not present

## 2017-05-28 DIAGNOSIS — E119 Type 2 diabetes mellitus without complications: Secondary | ICD-10-CM

## 2017-05-28 DIAGNOSIS — R5383 Other fatigue: Secondary | ICD-10-CM

## 2017-05-28 DIAGNOSIS — M1990 Unspecified osteoarthritis, unspecified site: Secondary | ICD-10-CM

## 2017-05-28 DIAGNOSIS — C8332 Diffuse large B-cell lymphoma, intrathoracic lymph nodes: Secondary | ICD-10-CM | POA: Diagnosis not present

## 2017-05-28 DIAGNOSIS — Z9981 Dependence on supplemental oxygen: Secondary | ICD-10-CM | POA: Diagnosis not present

## 2017-05-28 DIAGNOSIS — I10 Essential (primary) hypertension: Secondary | ICD-10-CM

## 2017-05-28 DIAGNOSIS — Z7982 Long term (current) use of aspirin: Secondary | ICD-10-CM

## 2017-05-28 MED ORDER — PROCHLORPERAZINE MALEATE 10 MG PO TABS: 10.0000 mg | ORAL_TABLET | Freq: Four times a day (QID) | ORAL | 0 refills | Status: AC | PRN

## 2017-05-28 MED ORDER — LIDOCAINE-PRILOCAINE 2.5-2.5 % EX CREA: 1.0000 "application " | TOPICAL_CREAM | CUTANEOUS | 0 refills | Status: AC | PRN

## 2017-05-28 MED ORDER — ALLOPURINOL 100 MG PO TABS: 100.0000 mg | ORAL_TABLET | Freq: Two times a day (BID) | ORAL | 1 refills | Status: DC

## 2017-05-28 MED ORDER — PREDNISONE 50 MG PO TABS: ORAL_TABLET | ORAL | 1 refills | Status: DC

## 2017-05-28 NOTE — Assessment & Plan Note (Addendum)
This is a pleasant 84 year old African-American female with newly diagnosed diffuse large B-cell lymphoma.  The patient is asymptomatic except for mild fatigue. She had a recent MRI of the brain and axillary lymph node biopsy and is here to discuss the results and treatment options.  The patient was seen with Dr. Julien Nordmann.  We discussed the MRI of the brain does not show any evidence of metastatic disease.  Lymph node biopsy shows a new diagnosis of diffuse large B-cell lymphoma.  The diagnosis, prognosis, and treatment options were discussed with the patient and her family.  Recommend that she see with systemic chemotherapy consisting of CHOP and Rituxan. The adverse effects of this treatment were discussed with the patient including but not limited to myelosuppression, nausea and vomiting, alopecia, liver and renal dysfunction, and peripheral neuropathy.  The patient would like to proceed with treatment. We will set the patient up with [name] for Port-A-Cath placement.  EMLA cream has been sent to her pharmacy. Prescription for prednisone 100 mg daily times 5 days with each cycle of chemotherapy was sent to her pharmacy along with Compazine 10 mg every 6 hours as needed for nausea and vomiting, and allopurinol 100 mg twice a day.  The patient will have a 2D echocardiogram performed prior to her chemotherapy.  We will send her back to the lab today for hepatitis panel and EBV titer.  The patient had hypermetabolism at her uvula.  We offered her the option of referral to ENT versus watching this on subsequent CT scans.  For now, we will monitor this on CT scans.  The patient will have weekly labs while she is receiving her chemotherapy.  Anticipate first dose of chemotherapy to be administered on 06/10/2017.  We will see her back 1 week after her first cycle to monitor for side effects.

## 2017-05-28 NOTE — Telephone Encounter (Signed)
Lab Tech advised pt  Labs hemolyzed, labs ordered today will need to be redrawn. Called pt, unable to reach. LM with family member for pt to call [facility] about lab appt tomorrow. Message to scheduling.

## 2017-05-28 NOTE — Assessment & Plan Note (Addendum)
Stage IIA non-small cell lung cancer status post left upper lobectomy with lymph node dissection followed by 4 cycles of adjuvant systemic chemotherapy with carboplatin and paclitaxel The patient has been on observation since July 2016. She had repeat CT scan of the chest performed recently which showed progressive right hilar and infrahilar nodes suspicious for disease recurrence. She had a recent PET scan and subsequent biopsy which showed diffuse large B-cell lymphoma. The patient will remain on observation for her lung cancer.  We will be performing Neotic CT scans while she is receiving treatment for her lymphoma.  For COPD, she will continue her current follow-up visit and evaluation by Dr. Elsworth Soho.  For hypertension, I strongly encouraged the patient to take her medication as prescribed and to check it regularly at home and report to her primary care physician for adjustment of her medications.  She was advised to call immediately if she has any concerning symptoms in the interval. The patient voices understanding of current disease status and treatment options and is in agreement with the current care plan. All questions were answered. The patient knows to call the clinic with any problems, questions or concerns. We can certainly see the patient much sooner if necessary.

## 2017-05-28 NOTE — Patient Instructions (Signed)
Doxorubicin injection What is this medicine? DOXORUBICIN (dox oh ROO bi sin) is a chemotherapy drug. It is used to treat many kinds of cancer like leukemia, lymphoma, neuroblastoma, sarcoma, and Wilms' tumor. It is also used to treat bladder cancer, breast cancer, lung cancer, ovarian cancer, stomach cancer, and thyroid cancer. This medicine may be used for other purposes; ask your health care provider or pharmacist if you have questions. COMMON BRAND NAME(S): Adriamycin, Adriamycin PFS, Adriamycin RDF, Rubex What should I tell my health care provider before I take this medicine? They need to know if you have any of these conditions: -heart disease -history of low blood counts caused by a medicine -liver disease -recent or ongoing radiation therapy -an unusual or allergic reaction to doxorubicin, other chemotherapy agents, other medicines, foods, dyes, or preservatives -pregnant or trying to get pregnant -breast-feeding How should I use this medicine? This drug is given as an infusion into a vein. It is administered in a hospital or clinic by a specially trained health care professional. If you have pain, swelling, burning or any unusual feeling around the site of your injection, tell your health care professional right away. Talk to your pediatrician regarding the use of this medicine in children. Special care may be needed. Overdosage: If you think you have taken too much of this medicine contact a poison control center or emergency room at once. NOTE: This medicine is only for you. Do not share this medicine with others. What if I miss a dose? It is important not to miss your dose. Call your doctor or health care professional if you are unable to keep an appointment. What may interact with this medicine? This medicine may interact with the following medications: -6-mercaptopurine -paclitaxel -phenytoin -St. John's Wort -trastuzumab -verapamil This list may not describe all possible  interactions. Give your health care provider a list of all the medicines, herbs, non-prescription drugs, or dietary supplements you use. Also tell them if you smoke, drink alcohol, or use illegal drugs. Some items may interact with your medicine. What should I watch for while using this medicine? This drug may make you feel generally unwell. This is not uncommon, as chemotherapy can affect healthy cells as well as cancer cells. Report any side effects. Continue your course of treatment even though you feel ill unless your doctor tells you to stop. There is a maximum amount of this medicine you should receive throughout your life. The amount depends on the medical condition being treated and your overall health. Your doctor will watch how much of this medicine you receive in your lifetime. Tell your doctor if you have taken this medicine before. You may need blood work done while you are taking this medicine. Your urine may turn red for a few days after your dose. This is not blood. If your urine is dark or brown, call your doctor. In some cases, you may be given additional medicines to help with side effects. Follow all directions for their use. Call your doctor or health care professional for advice if you get a fever, chills or sore throat, or other symptoms of a cold or flu. Do not treat yourself. This drug decreases your body's ability to fight infections. Try to avoid being around people who are sick. This medicine may increase your risk to bruise or bleed. Call your doctor or health care professional if you notice any unusual bleeding. Talk to your doctor about your risk of cancer. You may be more at risk for certain   types of cancers if you take this medicine. Do not become pregnant while taking this medicine or for 6 months after stopping it. Women should inform their doctor if they wish to become pregnant or think they might be pregnant. Men should not father a child while taking this medicine and  for 6 months after stopping it. There is a potential for serious side effects to an unborn child. Talk to your health care professional or pharmacist for more information. Do not breast-feed an infant while taking this medicine. This medicine has caused ovarian failure in some women and reduced sperm counts in some men This medicine may interfere with the ability to have a child. Talk with your doctor or health care professional if you are concerned about your fertility. What side effects may I notice from receiving this medicine? Side effects that you should report to your doctor or health care professional as soon as possible: -allergic reactions like skin rash, itching or hives, swelling of the face, lips, or tongue -breathing problems -chest pain -fast or irregular heartbeat -low blood counts - this medicine may decrease the number of white blood cells, red blood cells and platelets. You may be at increased risk for infections and bleeding. -pain, redness, or irritation at site where injected -signs of infection - fever or chills, cough, sore throat, pain or difficulty passing urine -signs of decreased platelets or bleeding - bruising, pinpoint red spots on the skin, black, tarry stools, blood in the urine -swelling of the ankles, feet, hands -tiredness -weakness Side effects that usually do not require medical attention (report to your doctor or health care professional if they continue or are bothersome): -diarrhea -hair loss -mouth sores -nail discoloration or damage -nausea -red colored urine -vomiting This list may not describe all possible side effects. Call your doctor for medical advice about side effects. You may report side effects to FDA at 1-800-FDA-1088. Where should I keep my medicine? This drug is given in a hospital or clinic and will not be stored at home. NOTE: This sheet is a summary. It may not cover all possible information. If you have questions about this  medicine, talk to your doctor, pharmacist, or health care provider.  2018 Elsevier/Gold Standard (2015-03-26 11:28:51)  Vincristine injection What is this medicine? VINCRISTINE (vin KRIS teen) is a chemotherapy drug. It slows the growth of cancer cells. This medicine is used to treat many types of cancer like Hodgkin's disease, leukemia, non-Hodgkin's lymphoma, neuroblastoma (brain cancer), rhabdomyosarcoma, and Wilms' tumor. This medicine may be used for other purposes; ask your health care provider or pharmacist if you have questions. COMMON BRAND NAME(S): Oncovin, Vincasar PFS What should I tell my health care provider before I take this medicine? They need to know if you have any of these conditions: -blood disorders -gout -infection (especially chickenpox, cold sores, or herpes) -kidney disease -liver disease -lung disease -nervous system disease like Charcot-Marie-Tooth (CMT) -recent or ongoing radiation therapy -an unusual or allergic reaction to vincristine, other chemotherapy agents, other medicines, foods, dyes, or preservatives -pregnant or trying to get pregnant -breast-feeding How should I use this medicine? This drug is given as an infusion into a vein. It is administered in a hospital or clinic by a specially trained health care professional. If you have pain, swelling, burning, or any unusual feeling around the site of your injection, tell your health care professional right away. Talk to your pediatrician regarding the use of this medicine in children. While this drug may be  prescribed for selected conditions, precautions do apply. Overdosage: If you think you have taken too much of this medicine contact a poison control center or emergency room at once. NOTE: This medicine is only for you. Do not share this medicine with others. What if I miss a dose? It is important not to miss your dose. Call your doctor or health care professional if you are unable to keep an  appointment. What may interact with this medicine? Do not take this medicine with any of the following medications: -itraconazole -mibefradil -voriconazole This medicine may also interact with the following medications: -cyclosporine -erythromycin -fluconazole -ketoconazole -medicines for HIV like delavirdine, efavirenz, nevirapine -medicines for seizures like ethotoin, fosphenotoin, phenytoin -medicines to increase blood counts like filgrastim, pegfilgrastim, sargramostim -other chemotherapy drugs like cisplatin, L-asparaginase, methotrexate, mitomycin, paclitaxel -pegaspargase -vaccines -zalcitabine, ddC Talk to your doctor or health care professional before taking any of these medicines: -acetaminophen -aspirin -ibuprofen -ketoprofen -naproxen This list may not describe all possible interactions. Give your health care provider a list of all the medicines, herbs, non-prescription drugs, or dietary supplements you use. Also tell them if you smoke, drink alcohol, or use illegal drugs. Some items may interact with your medicine. What should I watch for while using this medicine? Your condition will be monitored carefully while you are receiving this medicine. You will need important blood work done while you are taking this medicine. This drug may make you feel generally unwell. This is not uncommon, as chemotherapy can affect healthy cells as well as cancer cells. Report any side effects. Continue your course of treatment even though you feel ill unless your doctor tells you to stop. In some cases, you may be given additional medicines to help with side effects. Follow all directions for their use. Call your doctor or health care professional for advice if you get a fever, chills or sore throat, or other symptoms of a cold or flu. Do not treat yourself. Avoid taking products that contain aspirin, acetaminophen, ibuprofen, naproxen, or ketoprofen unless instructed by your doctor. These  medicines may hide a fever. Do not become pregnant while taking this medicine. Women should inform their doctor if they wish to become pregnant or think they might be pregnant. There is a potential for serious side effects to an unborn child. Talk to your health care professional or pharmacist for more information. Do not breast-feed an infant while taking this medicine. Men may have a lower sperm count while taking this medicine. Talk to your doctor if you plan to father a child. What side effects may I notice from receiving this medicine? Side effects that you should report to your doctor or health care professional as soon as possible: -allergic reactions like skin rash, itching or hives, swelling of the face, lips, or tongue -breathing problems -confusion or changes in emotions or moods -constipation -cough -mouth sores -muscle weakness -nausea and vomiting -pain, swelling, redness or irritation at the injection site -pain, tingling, numbness in the hands or feet -problems with balance, talking, walking -seizures -stomach pain -trouble passing urine or change in the amount of urine Side effects that usually do not require medical attention (report to your doctor or health care professional if they continue or are bothersome): -diarrhea -hair loss -jaw pain -loss of appetite This list may not describe all possible side effects. Call your doctor for medical advice about side effects. You may report side effects to FDA at 1-800-FDA-1088. Where should I keep my medicine? This drug is given  in a hospital or clinic and will not be stored at home. NOTE: This sheet is a summary. It may not cover all possible information. If you have questions about this medicine, talk to your doctor, pharmacist, or health care provider.  2018 Elsevier/Gold Standard (2007-10-25 17:17:13) Cyclophosphamide injection What is this medicine? CYCLOPHOSPHAMIDE (sye kloe FOSS fa mide) is a chemotherapy drug. It  slows the growth of cancer cells. This medicine is used to treat many types of cancer like lymphoma, myeloma, leukemia, breast cancer, and ovarian cancer, to name a few. This medicine may be used for other purposes; ask your health care provider or pharmacist if you have questions. COMMON BRAND NAME(S): Cytoxan, Neosar What should I tell my health care provider before I take this medicine? They need to know if you have any of these conditions: -blood disorders -history of other chemotherapy -infection -kidney disease -liver disease -recent or ongoing radiation therapy -tumors in the bone marrow -an unusual or allergic reaction to cyclophosphamide, other chemotherapy, other medicines, foods, dyes, or preservatives -pregnant or trying to get pregnant -breast-feeding How should I use this medicine? This drug is usually given as an injection into a vein or muscle or by infusion into a vein. It is administered in a hospital or clinic by a specially trained health care professional. Talk to your pediatrician regarding the use of this medicine in children. Special care may be needed. Overdosage: If you think you have taken too much of this medicine contact a poison control center or emergency room at once. NOTE: This medicine is only for you. Do not share this medicine with others. What if I miss a dose? It is important not to miss your dose. Call your doctor or health care professional if you are unable to keep an appointment. What may interact with this medicine? This medicine may interact with the following medications: -amiodarone -amphotericin B -azathioprine -certain antiviral medicines for HIV or AIDS such as protease inhibitors (e.g., indinavir, ritonavir) and zidovudine -certain blood pressure medications such as benazepril, captopril, enalapril, fosinopril, lisinopril, moexipril, monopril, perindopril, quinapril, ramipril, trandolapril -certain cancer medications such as  anthracyclines (e.g., daunorubicin, doxorubicin), busulfan, cytarabine, paclitaxel, pentostatin, tamoxifen, trastuzumab -certain diuretics such as chlorothiazide, chlorthalidone, hydrochlorothiazide, indapamide, metolazone -certain medicines that treat or prevent blood clots like warfarin -certain muscle relaxants such as succinylcholine -cyclosporine -etanercept -indomethacin -medicines to increase blood counts like filgrastim, pegfilgrastim, sargramostim -medicines used as general anesthesia -metronidazole -natalizumab This list may not describe all possible interactions. Give your health care provider a list of all the medicines, herbs, non-prescription drugs, or dietary supplements you use. Also tell them if you smoke, drink alcohol, or use illegal drugs. Some items may interact with your medicine. What should I watch for while using this medicine? Visit your doctor for checks on your progress. This drug may make you feel generally unwell. This is not uncommon, as chemotherapy can affect healthy cells as well as cancer cells. Report any side effects. Continue your course of treatment even though you feel ill unless your doctor tells you to stop. Drink water or other fluids as directed. Urinate often, even at night. In some cases, you may be given additional medicines to help with side effects. Follow all directions for their use. Call your doctor or health care professional for advice if you get a fever, chills or sore throat, or other symptoms of a cold or flu. Do not treat yourself. This drug decreases your body's ability to fight infections. Try to avoid being  around people who are sick. This medicine may increase your risk to bruise or bleed. Call your doctor or health care professional if you notice any unusual bleeding. Be careful brushing and flossing your teeth or using a toothpick because you may get an infection or bleed more easily. If you have any dental work done, tell your dentist  you are receiving this medicine. You may get drowsy or dizzy. Do not drive, use machinery, or do anything that needs mental alertness until you know how this medicine affects you. Do not become pregnant while taking this medicine or for 1 year after stopping it. Women should inform their doctor if they wish to become pregnant or think they might be pregnant. Men should not father a child while taking this medicine and for 4 months after stopping it. There is a potential for serious side effects to an unborn child. Talk to your health care professional or pharmacist for more information. Do not breast-feed an infant while taking this medicine. This medicine may interfere with the ability to have a child. This medicine has caused ovarian failure in some women. This medicine has caused reduced sperm counts in some men. You should talk with your doctor or health care professional if you are concerned about your fertility. If you are going to have surgery, tell your doctor or health care professional that you have taken this medicine. What side effects may I notice from receiving this medicine? Side effects that you should report to your doctor or health care professional as soon as possible: -allergic reactions like skin rash, itching or hives, swelling of the face, lips, or tongue -low blood counts - this medicine may decrease the number of white blood cells, red blood cells and platelets. You may be at increased risk for infections and bleeding. -signs of infection - fever or chills, cough, sore throat, pain or difficulty passing urine -signs of decreased platelets or bleeding - bruising, pinpoint red spots on the skin, black, tarry stools, blood in the urine -signs of decreased red blood cells - unusually weak or tired, fainting spells, lightheadedness -breathing problems -dark urine -dizziness -palpitations -swelling of the ankles, feet, hands -trouble passing urine or change in the amount of  urine -weight gain -yellowing of the eyes or skin Side effects that usually do not require medical attention (report to your doctor or health care professional if they continue or are bothersome): -changes in nail or skin color -hair loss -missed menstrual periods -mouth sores -nausea, vomiting This list may not describe all possible side effects. Call your doctor for medical advice about side effects. You may report side effects to FDA at 1-800-FDA-1088. Where should I keep my medicine? This drug is given in a hospital or clinic and will not be stored at home. NOTE: This sheet is a summary. It may not cover all possible information. If you have questions about this medicine, talk to your doctor, pharmacist, or health care provider.  2018 Else[employer]/Gold Standard (2011-12-12 16:22:58)  Prednisone tablets What is this medicine? PREDNISONE (PRED ni sone) is a corticosteroid. It is commonly used to treat inflammation of the skin, joints, lungs, and other organs. Common conditions treated include asthma, allergies, and arthritis. It is also used for other conditions, such as blood disorders and diseases of the adrenal glands. This medicine may be used for other purposes; ask your health care provider or pharmacist if you have questions. COMMON BRAND NAME(S): Deltasone, Predone, Sterapred, Sterapred DS What should I tell my health  care provider before I take this medicine? They need to know if you have any of these conditions: -Cushing's syndrome -diabetes -glaucoma -heart disease -high blood pressure -infection (especially a virus infection such as chickenpox, cold sores, or herpes) -kidney disease -liver disease -mental illness -myasthenia gravis -osteoporosis -seizures -stomach or intestine problems -thyroid disease -an unusual or allergic reaction to lactose, prednisone, other medicines, foods, dyes, or preservatives -pregnant or trying to get pregnant -breast-feeding How should  I use this medicine? Take this medicine by mouth with a glass of water. Follow the directions on the prescription label. Take this medicine with food. If you are taking this medicine once a day, take it in the morning. Do not take more medicine than you are told to take. Do not suddenly stop taking your medicine because you may develop a severe reaction. Your doctor will tell you how much medicine to take. If your doctor wants you to stop the medicine, the dose may be slowly lowered over time to avoid any side effects. Talk to your pediatrician regarding the use of this medicine in children. Special care may be needed. Overdosage: If you think you have taken too much of this medicine contact a poison control center or emergency room at once. NOTE: This medicine is only for you. Do not share this medicine with others. What if I miss a dose? If you miss a dose, take it as soon as you can. If it is almost time for your next dose, talk to your doctor or health care professional. You may need to miss a dose or take an extra dose. Do not take double or extra doses without advice. What may interact with this medicine? Do not take this medicine with any of the following medications: -metyrapone -mifepristone This medicine may also interact with the following medications: -aminoglutethimide -amphotericin B -aspirin and aspirin-like medicines -barbiturates -certain medicines for diabetes, like glipizide or glyburide -cholestyramine -cholinesterase inhibitors -cyclosporine -digoxin -diuretics -ephedrine -female hormones, like estrogens and birth control pills -isoniazid -ketoconazole -NSAIDS, medicines for pain and inflammation, like ibuprofen or naproxen -phenytoin -rifampin -toxoids -vaccines -warfarin This list may not describe all possible interactions. Give your health care provider a list of all the medicines, herbs, non-prescription drugs, or dietary supplements you use. Also tell them if  you smoke, drink alcohol, or use illegal drugs. Some items may interact with your medicine. What should I watch for while using this medicine? Visit your doctor or health care professional for regular checks on your progress. If you are taking this medicine over a prolonged period, carry an identification card with your name and address, the type and dose of your medicine, and your doctor's name and address. This medicine may increase your risk of getting an infection. Tell your doctor or health care professional if you are around anyone with measles or chickenpox, or if you develop sores or blisters that do not heal properly. If you are going to have surgery, tell your doctor or health care professional that you have taken this medicine within the last twelve months. Ask your doctor or health care professional about your diet. You may need to lower the amount of salt you eat. This medicine may affect blood sugar levels. If you have diabetes, check with your doctor or health care professional before you change your diet or the dose of your diabetic medicine. What side effects may I notice from receiving this medicine? Side effects that you should report to your doctor or health care professional  as soon as possible: -allergic reactions like skin rash, itching or hives, swelling of the face, lips, or tongue -changes in emotions or moods -changes in vision -depressed mood -eye pain -fever or chills, cough, sore throat, pain or difficulty passing urine -increased thirst -swelling of ankles, feet Side effects that usually do not require medical attention (report to your doctor or health care professional if they continue or are bothersome): -confusion, excitement, restlessness -headache -nausea, vomiting -skin problems, acne, thin and shiny skin -trouble sleeping -weight gain This list may not describe all possible side effects. Call your doctor for medical advice about side effects. You may  report side effects to FDA at 1-800-FDA-1088. Where should I keep my medicine? Keep out of the reach of children. Store at room temperature between 15 and 30 degrees C (59 and 86 degrees F). Protect from light. Keep container tightly closed. Throw away any unused medicine after the expiration date. NOTE: This sheet is a summary. It may not cover all possible information. If you have questions about this medicine, talk to your doctor, pharmacist, or health care provider.  2018 [employer] Standard (2010-09-12 10:57:14)

## 2017-05-28 NOTE — Progress Notes (Signed)
[facility] OFFICE PROGRESS NOTE  [name] Ebbs, MD [place] [id number]  DIAGNOSIS:  1) Stage IIA (T2b, N0, M0) non-small cell lung cancer, moderately to poorly differentiated squamous cell carcinoma diagnosed in April 2016. 2) Diffuse large B-cell lymphoma diagnosed in April 2019.  PRIOR THERAPY: 1) Status post left upper lobectomy with lymph node dissection under the care of [name] on 05/04/2014. 2) Adjuvant chemotherapy with carboplatin for an AUC of 5 and paclitaxel 175 mg/m given every 3 weeks with Neulasta support. Status post 4 cycles. Last dose was given 08/26/2014.  CURRENT THERAPY: Systemic chemotherapy with Rituxan 375 mg meter squared, Cytoxan 750 mg meter squared, Adriamycin 50 mg/m, vincristine 2 mg, and prednisone 100 mg days 1 through 5 of each cycle of treatment.  Chemotherapy will be given every 3 weeks.  First dose expected on 06/10/2017.  INTERVAL HISTORY: [name] [name] 84 y.o. female returns for routine follow-up visit accompanied by her son and daughter.  The patient is feeling fine today has no specific complaints except mild fatigue.  She denies fevers chills.  Denies chest pain, cough, hemoptysis.  She has her baseline shortness of breath and wears oxygen as needed.  Denies nausea, vomiting, constipation, diarrhea.  Denies recent weight loss or night sweats.  The patient had a recent MRI of the brain and biopsy of her right axillary lymph node and is here to discuss the results and treatment options.  MEDICAL HISTORY: Past Medical History:  Diagnosis Date  . Allergic rhinitis   . Anxiety   . Arthritis   . Cancer ([place])   . COPD (chronic obstructive pulmonary disease) ([place])   . Diabetes mellitus without complication ([place])   . Emphysema of lung ([place])   . History of positive PPD, untreated   . HTN (hypertension)   . Palpitations   . Primary cancer of left upper lobe of lung ([place]) 05/23/2014   Stage IIA-  T2b,N0- thoracoscopic left upper lobectomy 05/04/14  . SOB (shortness of breath)     ALLERGIES:  is allergic to darvon [propoxyphene hcl] and lipitor [atorvastatin].  MEDICATIONS:  Current Outpatient Medications  Medication Sig Dispense Refill  . ALPRAZolam (XANAX) 0.5 MG tablet Take 0.5 mg by mouth 3 (three) times daily as needed for anxiety. Reported on 03/28/2015    . esomeprazole (NEXIUM) 40 MG capsule Take 40 mg by mouth daily as needed.     . fluorometholone (FML) 0.1 % ophthalmic suspension Place 1 drop into both eyes daily.    . Fluticasone-Umeclidin-Vilant (TRELEGY ELLIPTA) 100-62.5-25 MCG/INH AEPB Inhale 1 puff daily into the lungs. 1 each 6  . metoprolol tartrate (LOPRESSOR) 25 MG tablet Take 25 mg by mouth 2 (two) times daily.    . montelukast (SINGULAIR) 10 MG tablet Take 1 tablet (10 mg total) at bedtime by mouth. (Patient taking differently: Take 10 mg by mouth at bedtime as needed. ) 30 tablet 11  . multivitamin-iron-minerals-folic acid (CENTRUM) chewable tablet Chew 1 tablet by mouth daily.    . potassium chloride (K-DUR,KLOR-CON) 10 MEQ tablet Take 1 tablet by mouth daily.    Marland Kitchen tiZANidine (ZANAFLEX) 2 MG tablet Take 1 tablet by mouth daily as needed.    . traMADol (ULTRAM) 50 MG tablet Take 50 mg by mouth 2 (two) times daily as needed.    Marland Kitchen allopurinol (ZYLOPRIM) 100 MG tablet Take 1 tablet (100 mg total) by mouth 2 (two) times daily. 60 tablet 1  . Fluticasone-Salmeterol (ADVAIR DISKUS) 250-50 MCG/DOSE AEPB  Take 2 puffs by mouth 2 (two) times daily as needed.    . lidocaine-prilocaine (EMLA) cream Apply 1 application topically as needed. 30 g 0  . predniSONE (DELTASONE) 50 MG tablet Take 2 tabs (100 mg) daily X5 days with each cycle of chemo. 30 tablet 1  . prochlorperazine (COMPAZINE) 10 MG tablet Take 1 tablet (10 mg total) by mouth every 6 (six) hours as needed for nausea or vomiting. 30 tablet 0  . VENTOLIN HFA 108 (90 BASE) MCG/ACT inhaler Inhale 1-2 puffs into the lungs  every 4 (four) hours as needed for shortness of breath.      No current facility-administered medications for this visit.     SURGICAL HISTORY:  Past Surgical History:  Procedure Laterality Date  . LEAD REMOVAL Left 05/04/2014   Procedure: CRYO INTERCOSTAL NERVE BLOCK;  Surgeon: [name], MD;  Location: [facility];  Service: Thoracic;  Laterality: Left;  . LUNG LOBECTOMY  05/04/14   left upper lobectomy  . NODE DISSECTION Left 05/04/2014   Procedure: NODE DISSECTION;  Surgeon: [name], MD;  Location: [facility];  Service: Thoracic;  Laterality: Left;  Marland Kitchen VAGINAL HYSTERECTOMY  1966  . VIDEO ASSISTED THORACOSCOPY (VATS)/ LOBECTOMY Left 05/04/2014   Procedure: LEFT VIDEO ASSISTED THORACOSCOPY (VATS)/LEFT UPPER LOBECTOMY;  Surgeon: [name], MD;  Location: [facility];  Service: Thoracic;  Laterality: Left;    REVIEW OF SYSTEMS:   Review of Systems  Constitutional: Negative for appetite change, chills, fever and unexpected weight change. Positive for mild fatigue. HENT:   Negative for mouth sores, nosebleeds, sore throat and trouble swallowing.   Eyes: Negative for eye problems and icterus.  Respiratory: Negative for cough, hemoptysis, and wheezing.  She has her baseline shortness of breath and wears oxygen as needed.  Cardiovascular: Negative for chest pain and leg swelling.  Gastrointestinal: Negative for abdominal pain, constipation, diarrhea, nausea and vomiting.  Genitourinary: Negative for bladder incontinence, difficulty urinating, dysuria, frequency and hematuria.   Musculoskeletal: Negative for back pain, gait problem, neck pain and neck stiffness.  Skin: Negative for itching and rash.  Neurological: Negative for dizziness, extremity weakness, gait problem, headaches, light-headedness and seizures.  Hematological: Negative for adenopathy. Does not bruise/bleed easily.  Psychiatric/Behavioral: Negative for confusion, depression and sleep disturbance. The patient is  not nervous/anxious.     PHYSICAL EXAMINATION:  Blood pressure (!) 173/75, pulse 82, temperature 98.5 F (36.9 C), temperature source Oral, resp. rate 18, height 5\' 1"  (1.549 m), weight 207 lb 14.4 oz (94.3 kg), SpO2 91 %.  ECOG PERFORMANCE STATUS: 1 - Symptomatic but completely ambulatory  Physical Exam  Constitutional: Oriented to person, place, and time and well-developed, well-nourished, and in no distress. No distress.  HENT:  Head: Normocephalic and atraumatic.  Mouth/Throat: Oropharynx is clear and moist. No oropharyngeal exudate.  Eyes: Conjunctivae are normal. Right eye exhibits no discharge. Left eye exhibits no discharge. No scleral icterus.  Neck: Normal range of motion. Neck supple.  Cardiovascular: Normal rate, regular rhythm, normal heart sounds and intact distal pulses.   Pulmonary/Chest: Effort normal and breath sounds normal. No respiratory distress. No wheezes. No rales.  Abdominal: Soft. Bowel sounds are normal. Exhibits no distension and no mass. There is no tenderness.  Musculoskeletal: Normal range of motion. Exhibits no edema.  Lymphadenopathy:    No cervical adenopathy.  Neurological: Alert and oriented to person, place, and time. Exhibits normal muscle tone. Gait normal. Coordination normal.  Skin: Skin is warm and dry. No rash  noted. Not diaphoretic. No erythema. No pallor.  Psychiatric: Mood, memory and judgment normal.  Vitals reviewed.  LABORATORY DATA: Lab Results  Component Value Date   WBC 9.3 05/20/2017   HGB 11.7 (L) 05/20/2017   HCT 36.1 05/20/2017   MCV 83.2 05/20/2017   PLT 292 05/20/2017      Chemistry      Component Value Date/Time   NA 143 05/01/2017 0958   NA 144 01/05/2017 1017   K 4.1 05/01/2017 0958   K 3.8 01/05/2017 1017   CL 109 05/01/2017 0958   CO2 23 05/01/2017 0958   CO2 23 01/05/2017 1017   BUN 16 05/01/2017 0958   BUN 21.5 01/05/2017 1017   CREATININE 1.14 (H) 05/01/2017 0958   CREATININE 1.2 (H) 01/05/2017 1017       Component Value Date/Time   CALCIUM 9.8 05/01/2017 0958   CALCIUM 9.6 01/05/2017 1017   ALKPHOS 81 05/01/2017 0958   ALKPHOS 104 01/05/2017 1017   AST 25 05/01/2017 0958   AST 15 01/05/2017 1017   ALT 22 05/01/2017 0958   ALT 9 01/05/2017 1017   BILITOT 0.3 05/01/2017 0958   BILITOT 0.37 01/05/2017 1017       RADIOGRAPHIC STUDIES:  Ct Chest W Contrast  Result Date: 05/01/2017 CLINICAL DATA:  Lung cancer, diagnosed February 2016, status post left upper lobectomy. Chemotherapy complete June 2016. EXAM: CT CHEST WITH CONTRAST TECHNIQUE: Multidetector CT imaging of the chest was performed during intravenous contrast administration. CONTRAST:  69mL ISOVUE-300 IOPAMIDOL (ISOVUE-300) INJECTION 61% COMPARISON:  01/05/2017 FINDINGS: Cardiovascular: Heart is normal in size.  No pericardial effusion. No evidence of thoracic aortic aneurysm. Atherosclerotic calcifications of the aortic root and arch. Three vessel coronary atherosclerosis. Mediastinum/Nodes: Small mediastinal lymph nodes measuring 8-9 mm short axis, grossly unchanged. 15 mm short axis right hilar node (series 2/image 65), similar. 20 mm short axis right infrahilar node (series 2/image 69), previously 12 mm. This appearance is worrisome for nodal recurrence. Visualized left thyroid is mildly heterogeneous/nodular. Lungs/Pleura: Status post left upper lobectomy. Moderate centrilobular and paraseptal emphysematous changes. Scattered areas of bronchiectasis. Mild subpleural reticulation/fibrosis in the right lower lobe. No suspicious pulmonary nodules. No pleural effusion or pneumothorax. Upper Abdomen: Stable 1.5 cm right adrenal nodule. Small right renal cysts, measuring up to 1.7 cm in the medial right upper kidney, incompletely visualized. Musculoskeletal: Degenerative changes of the visualized thoracolumbar spine. IMPRESSION: Progressive right hilar/infrahilar nodes, measuring up to 2.0 cm short axis, worrisome for nodal recurrence.  Consider PET-CT for further evaluation. Status post left upper lobectomy. Aortic Atherosclerosis (ICD10-I70.0) and Emphysema (ICD10-J43.9). Electronically Signed   By: Julian Hy M.D.   On: 05/01/2017 15:58   Mr Jeri Cos LO Contrast  Result Date: 05/25/2017 CLINICAL DATA:  Primary cancer of left upper lobe. Stage IIA non-small cell lung cancer. EXAM: MRI HEAD WITHOUT AND WITH CONTRAST TECHNIQUE: Multiplanar, multiecho pulse sequences of the brain and surrounding structures were obtained without and with intravenous contrast. CONTRAST:  41mL MULTIHANCE GADOBENATE DIMEGLUMINE 529 MG/ML IV SOLN COMPARISON:  MRI brain 04/17/2014 FINDINGS: Brain: Atrophy and white matter changes are mildly advanced for age. No acute infarct, hemorrhage, or mass lesion is present. There is no focal enhancing lesion to suggest metastatic disease of the brain or meninges. The internal auditory canals are within normal limits bilaterally. Brainstem and cerebellum are normal. Vascular: Flow is present in the major intracranial arteries. Skull and upper cervical spine: Cystic changes at the right petrous apex is stable. There is no associated  enhancement. The skull base is normal. Grade 1 anterolisthesis at C2-3 with deformity of the C2 base likely reflecting remote fracture is stable. Degenerative changes are present at C3-4. Sinuses/Orbits: The paranasal sinuses and mastoid air cells are clear. Bilateral lens replacements are present. Globes and orbits are otherwise within normal limits. IMPRESSION: 1. No metastatic disease to the brain or meninges. 2. Stable atrophy and white matter disease. 3. Stable posttraumatic changes of the upper cervical spine at C2. No acute abnormality in the cervical spine. Electronically Signed   By: San Morelle M.D.   On: 05/25/2017 08:06   Nm Pet Image Restag (ps) Skull Base To Thigh  Result Date: 05/11/2017 CLINICAL DATA:  Subsequent treatment strategy for lung cancer. EXAM: NUCLEAR  MEDICINE PET SKULL BASE TO THIGH TECHNIQUE: 10.14 mCi F-18 FDG was injected intravenously. Full-ring PET imaging was performed from the skull base to thigh after the radiotracer. CT data was obtained and used for attenuation correction and anatomic localization. Fasting blood glucose: 153 mg/dl COMPARISON:  PET-CT 03/29/2014 and chest CT 05/01/2017 FINDINGS: Mediastinal blood pool activity: SUV max 3.91 NECK: 5 mm supraclavicular lymph node on the left side adjacent to the thyroid gland on image number 43 is metabolically active with SUV max of 4.56. No other adenopathy. Mild thickening of the uvula is noted with some hypermetabolism. This may be inflammatory. No discrete mass identified and it appears similar to the prior PET-CT. Direct visualization is suggested. Incidental CT findings: none CHEST: Right hilar adenopathy is hypermetabolic with SUV max of 47.4 consistent with recurrent lung cancer. Patchy areas of hypermetabolism are noted in the right lung which appear to correspond to areas of scarring and atelectasis. Vague nodular density in the right lower lobe on image number 71 measures 7 mm and is mildly hypermetabolic at SUV max of 3.0. 9 mm precarinal lymph node on image number 58 is hypermetabolic with SUV max of 4.0. 9 mm right axillary node on image number 51 is hypermetabolic with SUV max of 25.9. Incidental CT findings: Advanced emphysematous changes and dense pulmonary scarring. No obvious metastatic pulmonary nodules. Stable areas of bronchiectasis. ABDOMEN/PELVIS: No abnormal hypermetabolic activity within the liver, pancreas, adrenal glands, or spleen. No hypermetabolic lymph nodes in the abdomen or pelvis. Stable nodularity of both adrenal glands since the prior PET-CT consistent with benign adenomas. No hypermetabolism. There is a suspected soft tissue lesion involving the left side of the rectum. This measures 19 mm on image number 159 and is hypermetabolic with SUV max 5.63. Suspect colonic  polyp. Correlation with sigmoidoscopy is suggested. Incidental CT findings: Severe atherosclerotic calcifications involving the aorta and iliac arteries. Advanced colonic diverticulosis without findings for acute diverticulitis. Small periumbilical abdominal wall hernia, stable. SKELETON: No focal hypermetabolic activity to suggest skeletal metastasis. Incidental CT findings: none IMPRESSION: 1. Right hilar and mediastinal adenopathy demonstrating marked hypermetabolism and consistent with recurrent or new lung cancer. 2. Suspect 7 mm sub solid subpleural right lower lobe pulmonary lesion with mild hypermetabolism. Recommend surveillance. Other areas of patchy hypermetabolism in the right lung likely areas of inflammation. 3. No findings for recurrent left lung cancer. 4. Hypermetabolic left supraclavicular and right axillary lymph nodes 5. Hypermetabolism in the region of the uvula. Direct visualization suggested. 6. Solid-appearing lesion in the left rectum suspicious for a polyp. Moderate hypermetabolism. Recommend proctoscopic evaluation. 7. Stable bilateral adrenal gland nodules, likely benign adenomas. Electronically Signed   By: Marijo Sanes M.D.   On: 05/11/2017 10:24   [name] Core Biopsy (lymph  Nodes)  Result Date: 05/20/2017 INDICATION: 84 year old female with a past history of lung cancer. Recent PET imaging is concerning for local and axillary nodal recurrence. She presents for ultrasound-guided core biopsy of the right axillary lymph node. EXAM: Ultrasound-guided core biopsy lymph node MEDICATIONS: None. ANESTHESIA/SEDATION: Moderate (conscious) sedation was employed during this procedure. A total of Versed 1.5 mg and Fentanyl 25 mcg was administered intravenously. Moderate Sedation Time: 11 minutes. The patient's level of consciousness and vital signs were monitored continuously by radiology nursing throughout the procedure under my direct supervision. FLUOROSCOPY TIME:  Fluoroscopy Time: 0 minutes 0  seconds (0 mGy). COMPLICATIONS: None immediate. PROCEDURE: Informed written consent was obtained from the patient after a thorough discussion of the procedural risks, benefits and alternatives. All questions were addressed. Maximal Sterile Barrier Technique was utilized including caps, mask, sterile gowns, sterile gloves, sterile drape, hand hygiene and skin antiseptic. A timeout was performed prior to the initiation of the procedure. The right axillary region was interrogated with ultrasound. A 1.8 x 0.8 cm lymph node was identified. A suitable skin entry site was selected and marked. Local anesthesia was attained by infiltration with 1% lidocaine. A small dermatotomy was made. Under real-time sonographic guidance, multiple 18 gauge core biopsies were obtained of the cortex of the lymph node. Biopsy specimens were placed in saline and delivered to pathology for further analysis. Post biopsy ultrasound imaging demonstrates no evidence of immediate complication. The patient tolerated the procedure well. IMPRESSION: Technically successful ultrasound-guided core biopsy of right axillary lymph node. Signed, Criselda Peaches, MD [facility] Electronically Signed   By: Jacqulynn Cadet M.D.   On: 05/20/2017 14:21   PATHOLOGY:  Diagnosis Lymph node, needle/core biopsy, Right Axillary - DIFFUSE LARGE B-CELL LYMPHOMA - SEE COMMENT Microscopic Comment The core biopsy is composed of a large atypical lymphoid proliferation with admixed smaller lymphocytes. By immunohistochemistry, the atypical lymphocytes are positive for CD20, CD30, bcl-6, bcl-2 but negative for CD5, CD3, CD10, cytokeratin AE1/3 and p63. The proliferative rate is high (80-90%). EBV by in-situ hybridization is pending and will be reported in an addendum. Tissue is available for ancillary studie upon request. Dr. Tresa Moore reviewed the case and agrees with the above diagnosis. Ms. Mikey Bussing, NP was notified of these results on May 26, 2017. 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  The patient is asymptomatic except for mild fatigue. She had a recent MRI of the brain and axillary lymph node biopsy and is here to discuss the results and treatment options.  The patient was seen with Dr. Julien Nordmann.  We discussed the MRI of the brain does not show any evidence of metastatic disease.  Lymph node biopsy shows a new diagnosis of diffuse large B-cell lymphoma.  The diagnosis, prognosis, and treatment options were discussed with the patient and her family.  Recommend that she see with systemic chemotherapy consisting of CHOP and Rituxan. The adverse effects of this treatment were discussed with the patient including but not limited to myelosuppression, nausea and vomiting, alopecia, liver and renal dysfunction, and peripheral neuropathy.  The patient would like to proceed with treatment. We will set the patient up with [name] for Port-A-Cath placement.  EMLA cream has been sent to her pharmacy. Prescription for prednisone 100 mg daily times 5 days with  each cycle of chemotherapy was sent to her pharmacy along with Compazine 10 mg every 6 hours as needed for nausea and vomiting, and allopurinol 100 mg twice a day.  The patient will have a 2D echocardiogram performed prior to her chemotherapy.  We will send her back to the lab today for hepatitis panel and EBV titer.  The patient had hypermetabolism at her uvula.  We offered her the option of referral to ENT versus watching this on subsequent CT scans.  For now, we will monitor this on CT scans.  The patient will have weekly labs while she is receiving her chemotherapy.  Anticipate first  dose of chemotherapy to be administered on 06/10/2017.  We will see her back 1 week after her first cycle to monitor for side effects.  Primary cancer of left upper lobe of lung ([place]) Stage IIA non-small cell lung cancer status post left upper lobectomy with lymph node dissection followed by 4 cycles of adjuvant systemic chemotherapy with carboplatin and paclitaxel The patient has been on observation since July 2016. She had repeat CT scan of the chest performed recently which showed progressive right hilar and infrahilar nodes suspicious for disease recurrence. She had a recent PET scan and subsequent biopsy which showed diffuse large B-cell lymphoma. The patient will remain on observation for her lung cancer.  We will be performing Neotic CT scans while she is receiving treatment for her lymphoma.  For COPD, she will continue her current follow-up visit and evaluation by Dr. Elsworth Soho.  For hypertension, I strongly encouraged the patient to take her medication as prescribed and to check it regularly at home and report to her primary care physician for adjustment of her medications.  She was advised to call immediately if she has any concerning symptoms in the interval. The patient voices understanding of current disease status and treatment options and is in agreement with the current care plan. All questions were answered. The patient knows to call the clinic with any problems, questions or concerns. We can certainly see the patient much sooner if necessary.   Orders Placed This Encounter  Procedures  . Hepatitis panel, acute    Standing Status:   Future    Number of Occurrences:   1    Standing Expiration Date:   05/28/2018  . Epstein-Barr virus VCA, IgM    Standing Status:   Future    Number of Occurrences:   1    Standing Expiration Date:   05/28/2018  . Ambulatory referral to Cardiothoracic Surgery    Referral Priority:   Routine    Referral Type:   Surgical    Referral Reason:    Specialty Services Required    Referred to Provider:   [name], MD    Requested Specialty:   Cardiothoracic Surgery    Number of Visits Requested:   1  . ECHOCARDIOGRAM COMPLETE    Standing Status:   Future    Standing Expiration Date:   08/27/2018    Order Specific Question:   Where should this test be performed    Answer:   [facility]    Order Specific Question:   Complete or Limited study?    Answer:   Complete    Order Specific Question:   With Image Enhancing Agent or without Image Enhancing Agent?    Answer:   With Image Enhancing Agent    Order Specific Question:   Reason for exam-Echo    Answer:   Chemotherapy evaluation  v87.41 / v58.11   Mikey Bussing, DNP, AGPCNP-BC, AOCNP 05/28/17  ADDENDUM: Hematology/Oncology Attending: I had a face-to-face encounter with the patient today.  I recommended her care plan.  This is a very pleasant 84 years old African-American female with history of a stage IIa non-small cell lung cancer status post left upper lobectomy with lymph node dissection followed by 4 cycles of adjuvant chemotherapy was carboplatin and paclitaxel and has been in observation since July 2016.  She presented recently with a right hilar and mediastinal lymphadenopathy as well as hypermetabolic left supraclavicular and right axillary lymph nodes.  This was concerning for disease recurrence but the biopsy of the right axillary lymph node was consistent with large B cell non-Hodgkin lymphoma. I had a lengthy discussion with the patient and her family today about her current condition and treatment options.  She is likely has a stage III large B cell non-Hodgkin lymphoma. I discussed with the patient her treatment options and they recommended for her treatment with 6 cycles of systemic chemotherapy with CHOP/Rituxan.  I discussed with the patient the adverse effect of this treatment including but not limited to alopecia, myelosuppression, nausea and vomiting,  peripheral neuropathy, liver or renal dysfunction as well as cardiac dysfunction.  We will arrange for the patient to have 2D echo as well as hepatitis panel before the first dose of her treatment.  We will also start the patient on allopurinol as well as prednisone 100 mg p.o. daily for 5 days with the beginning of every cycle of her treatment.  She is expected to start the first dose of her treatment on June 03, 2017. She will come back for follow-up visit 1 week after her treatment for reevaluation and management of any adverse effect of her treatment. The patient and her family agreed to the current plan. She was advised to call immediately if she has any concerning symptoms in the interval.  Disclaimer: This note was dictated with voice recognition software. Similar sounding words can inadvertently be transcribed and may be missed upon review. Eilleen Kempf, MD 05/28/17

## 2017-05-28 NOTE — Progress Notes (Signed)
START ON PATHWAY REGIMEN - Lymphoma and CLL     A cycle is every 21 days:     Rituximab      Cyclophosphamide      Doxorubicin      Vincristine      Prednisone   **Always confirm dose/schedule in your pharmacy ordering system**    Patient Characteristics: Diffuse Large B-Cell Lymphoma, First Line, Stage III and IV Disease Type: Not Applicable Disease Type: Diffuse Large B-Cell Lymphoma Disease Type: Not Applicable Line of therapy: First Line [place] Stage: III Intent of Therapy: Curative Intent, Discussed with Patient

## 2017-05-28 NOTE — Telephone Encounter (Signed)
Printed avs and calender of upcoming appointment. Per 4/18 los was unable to print 5/1 appointments due to no aval. Infusion until 5/3. Mohamad approved for patient to have treatment on that day. Spoke with patient and she is aware of the treatment being moved to Friday. Patient can not schedule appointments on Tuesday or Thursday due to transportation.

## 2017-05-29 ENCOUNTER — Inpatient Hospital Stay: Payer: Medicare HMO

## 2017-05-29 ENCOUNTER — Other Ambulatory Visit: Payer: Self-pay | Admitting: *Deleted

## 2017-05-29 ENCOUNTER — Telehealth: Payer: Self-pay | Admitting: *Deleted

## 2017-05-29 ENCOUNTER — Other Ambulatory Visit: Payer: Self-pay | Admitting: Oncology

## 2017-05-29 DIAGNOSIS — C3412 Malignant neoplasm of upper lobe, left bronchus or lung: Secondary | ICD-10-CM | POA: Diagnosis not present

## 2017-05-29 DIAGNOSIS — C8332 Diffuse large B-cell lymphoma, intrathoracic lymph nodes: Secondary | ICD-10-CM

## 2017-05-29 NOTE — Telephone Encounter (Signed)
Lab tech advised pt needs to return for additional labs to be drawn. Orders were entered after labs were drawn.  Pt notified. Message to scheduling for pt to return for additional labs.  Reentered orders not collected: EBV ab to viral capsi ag panel IgG +IgM. All other orders have been entered by [facility].   Marland Kitchen

## 2017-05-30 LAB — HEPATITIS PANEL, ACUTE
HCV Ab: <0.1 s/co ratio (ref 0.0–0.9)
HEP A IGM: NEGATIVE
HEP B C IGM: NEGATIVE
Hepatitis B Surface Ag: NEGATIVE

## 2017-06-01 ENCOUNTER — Telehealth: Payer: Self-pay | Admitting: Medical Oncology

## 2017-06-01 ENCOUNTER — Encounter: Payer: Self-pay | Admitting: *Deleted

## 2017-06-01 ENCOUNTER — Inpatient Hospital Stay: Payer: Medicare HMO

## 2017-06-01 DIAGNOSIS — C3412 Malignant neoplasm of upper lobe, left bronchus or lung: Secondary | ICD-10-CM | POA: Diagnosis not present

## 2017-06-01 DIAGNOSIS — C8332 Diffuse large B-cell lymphoma, intrathoracic lymph nodes: Secondary | ICD-10-CM

## 2017-06-01 NOTE — Progress Notes (Signed)
Oncology Nurse Navigator Documentation  Oncology Nurse Navigator Flowsheets 06/01/2017  Navigator Location [facility]  Navigator Encounter Type Other/I updated Dr. Leonarda Salon office on referral for port placement.   Barriers/Navigation Needs Coordination of Care  Interventions Coordination of Care  Coordination of Care Other  Acuity Level 1  Time Spent with Patient 15

## 2017-06-01 NOTE — Telephone Encounter (Signed)
Returned call about appts. No answer.

## 2017-06-02 ENCOUNTER — Other Ambulatory Visit: Payer: Self-pay | Admitting: *Deleted

## 2017-06-02 ENCOUNTER — Encounter: Payer: Self-pay | Admitting: *Deleted

## 2017-06-02 DIAGNOSIS — C8590 Non-Hodgkin lymphoma, unspecified, unspecified site: Secondary | ICD-10-CM

## 2017-06-02 LAB — EBV AB TO VIRAL CAPSID AG PNL, IGG+IGM
EBV VCA IgG: >600 U/mL — ABNORMAL HIGH (ref 0.0–17.9)
EBV VCA IgM: 47.1 U/mL — ABNORMAL HIGH (ref 0.0–35.9)

## 2017-06-02 LAB — EPSTEIN-BARR VIRUS NUCLEAR ANTIGEN ANTIBODY, IGG: EBV NA IGG: 341 U/mL — AB (ref 0.0–17.9)

## 2017-06-02 NOTE — Progress Notes (Signed)
Oncology Nurse Navigator Documentation  Oncology Nurse Navigator Flowsheets 06/02/2017  Navigator Location [facility]  Navigator Encounter Type Other/I followed up on [name] [name]'s schedule to see [name]. I did not see it scheduled. I contacted his office for an update.   Barriers/Navigation Needs Coordination of Care  Interventions Coordination of Care  Coordination of Care Other  Acuity Level 1  Time Spent with Patient 15

## 2017-06-05 NOTE — Pre-Procedure Instructions (Signed)
Tennelle Taflinger  06/05/2017       [place], [place] [address] [place] [address] Phone: [phone] Fax: [phone]    Your procedure is scheduled on Wednesday, May 1.  Report to [facility] Admitting at 6:30 A.M.                Your surgery or procedure is scheduled for 8:30 AM   Call this number if you have problems the morning of surgery: [phone]    Remember:  Do not eat food or drink liquids after midnight Tuesday, April 30.  Take these medicines the morning of surgery with A SIP OF WATER : allopurinol (ZYLOPRIM)  Fluticasone-Umeclidin-Vilant (TRELEGY ELLIPTA) - inhaler metoprolol tartrate (LOPRESSOR)   If needed: Eye drops Fluticasone-Salmeterol (ADVAIR DISKUS) inhaler tiZANidine (ZANAFLEX) VENTOLIN HFA 108 (90 BASE) inhaler- bring it with you to the hospital  Special instructions:   [facility]- Preparing For Surgery  Before surgery, you can play an important role. Because skin is not sterile, your skin needs to be as free of germs as possible. You can reduce the number of germs on your skin by washing with CHG (chlorahexidine gluconate) Soap before surgery.  CHG is an antiseptic cleaner which kills germs and bonds with the skin to continue killing germs even after washing.  Please do not use if you have an allergy to CHG or antibacterial soaps. If your skin becomes reddened/irritated stop using the CHG.  Do not shave (including legs and underarms) for at least 48 hours prior to first CHG shower. It is OK to shave your face.  Please follow these instructions carefully.   1. Shower the NIGHT BEFORE SURGERY and the MORNING OF SURGERY with CHG.   2. If you chose to wash your hair, wash your hair first as usual with your normal shampoo.  3. After you shampoo, rinse your hair and body thoroughly to remove the shampoo.        Wash Face and genitals (private parts)  with your normal  soap, then rinse.  4. Use CHG as you would any other liquid soap. You can apply CHG directly to the skin and wash gently with a scrungie or a clean washcloth.   5. Apply the CHG Soap to your body ONLY FROM THE NECK DOWN.  Do not use on open wounds or open sores. Avoid contact with your eyes, ears, mouth and genitals (private parts).   6. Wash thoroughly, paying special attention to the area where your surgery will be performed.  7. Thoroughly rinse your body with warm water from the neck down.  8. DO NOT shower/wash with your normal soap after using and rinsing off the CHG Soap.  9. Pat yourself dry with a CLEAN TOWEL.  10. Wear CLEAN PAJAMAS to bed the night before surgery, wear comfortable clothes the morning of surgery  11. Place CLEAN SHEETS on your bed the night of your first shower and DO NOT SLEEP WITH PETS.  Day of Surgery: Shower as above. Do not apply any deodorants/lotions, powders or colognes. Please wear clean clothes to the hospital/surgery center.  Do not wear jewelry, make-up or nail polish.  Do not shave 48 hours prior to surgery.  Men may shave face and neck.  Do not bring valuables to the hospital.  [facility] is not responsible for any belongings or valuables.  Contacts, dentures or bridgework may not be  worn into surgery.  Leave your suitcase in the car.  After surgery it may be brought to your room.  For patients admitted to the hospital, discharge time will be determined by your treatment team.  Patients discharged the day of surgery will not be allowed to drive home.   Please read over the following fact sheets that you were given: Pain Booklet , Coughing and Deep Breathing,  Surgical Site Infections.

## 2017-06-08 ENCOUNTER — Ambulatory Visit (HOSPITAL_COMMUNITY)
Admission: RE | Admit: 2017-06-08 | Discharge: 2017-06-08 | Disposition: A | Discharge status: Home / Self Care | Payer: Medicare HMO | Source: Ambulatory Visit | Attending: Thoracic Surgery (Cardiothoracic Vascular Surgery) | Admitting: Thoracic Surgery (Cardiothoracic Vascular Surgery)

## 2017-06-08 ENCOUNTER — Other Ambulatory Visit: Payer: Self-pay

## 2017-06-08 ENCOUNTER — Ambulatory Visit (HOSPITAL_COMMUNITY)
Admission: RE | Admit: 2017-06-08 | Discharge: 2017-06-08 | Disposition: A | Discharge status: Home / Self Care | Payer: Medicare HMO | Source: Ambulatory Visit | Attending: Oncology | Admitting: Oncology

## 2017-06-08 ENCOUNTER — Encounter (HOSPITAL_COMMUNITY)
Admission: RE | Admit: 2017-06-08 | Discharge: 2017-06-08 | Disposition: A | Discharge status: Home / Self Care | Payer: Medicare HMO | Source: Ambulatory Visit | Attending: Thoracic Surgery (Cardiothoracic Vascular Surgery) | Admitting: Thoracic Surgery (Cardiothoracic Vascular Surgery)

## 2017-06-08 ENCOUNTER — Encounter (HOSPITAL_COMMUNITY): Payer: Self-pay

## 2017-06-08 DIAGNOSIS — R918 Other nonspecific abnormal finding of lung field: Secondary | ICD-10-CM | POA: Insufficient documentation

## 2017-06-08 DIAGNOSIS — I10 Essential (primary) hypertension: Secondary | ICD-10-CM | POA: Diagnosis not present

## 2017-06-08 DIAGNOSIS — Z01812 Encounter for preprocedural laboratory examination: Secondary | ICD-10-CM | POA: Diagnosis present

## 2017-06-08 DIAGNOSIS — E785 Hyperlipidemia, unspecified: Secondary | ICD-10-CM | POA: Insufficient documentation

## 2017-06-08 DIAGNOSIS — R001 Bradycardia, unspecified: Secondary | ICD-10-CM | POA: Diagnosis not present

## 2017-06-08 DIAGNOSIS — C8590 Non-Hodgkin lymphoma, unspecified, unspecified site: Secondary | ICD-10-CM | POA: Diagnosis not present

## 2017-06-08 DIAGNOSIS — J449 Chronic obstructive pulmonary disease, unspecified: Secondary | ICD-10-CM | POA: Diagnosis not present

## 2017-06-08 DIAGNOSIS — Z01810 Encounter for preprocedural cardiovascular examination: Secondary | ICD-10-CM | POA: Diagnosis present

## 2017-06-08 DIAGNOSIS — E119 Type 2 diabetes mellitus without complications: Secondary | ICD-10-CM | POA: Insufficient documentation

## 2017-06-08 DIAGNOSIS — Z01818 Encounter for other preprocedural examination: Secondary | ICD-10-CM | POA: Insufficient documentation

## 2017-06-08 DIAGNOSIS — I340 Nonrheumatic mitral (valve) insufficiency: Secondary | ICD-10-CM | POA: Insufficient documentation

## 2017-06-08 DIAGNOSIS — C8299 Follicular lymphoma, unspecified, extranodal and solid organ sites: Secondary | ICD-10-CM

## 2017-06-08 DIAGNOSIS — E669 Obesity, unspecified: Secondary | ICD-10-CM | POA: Insufficient documentation

## 2017-06-08 HISTORY — DX: Gastro-esophageal reflux disease without esophagitis: K21.9

## 2017-06-08 HISTORY — DX: Dependence on supplemental oxygen: Z99.81

## 2017-06-08 HISTORY — DX: Dyspnea, unspecified: R06.00

## 2017-06-08 LAB — COMPREHENSIVE METABOLIC PANEL
ALBUMIN: 3.6 g/dL (ref 3.5–5.0)
ALT: 14 U/L (ref 14–54)
AST: 24 U/L (ref 15–41)
Alkaline Phosphatase: 80 U/L (ref 38–126)
Anion gap: 10 (ref 5–15)
BUN: 16 mg/dL (ref 6–20)
CHLORIDE: 108 mmol/L (ref 101–111)
CO2: 22 mmol/L (ref 22–32)
Calcium: 10.2 mg/dL (ref 8.9–10.3)
Creatinine, Ser: 1.11 mg/dL — ABNORMAL HIGH (ref 0.44–1.00)
GFR calc Af Amer: 53 mL/min — ABNORMAL LOW (ref >60)
GFR calc non Af Amer: 45 mL/min — ABNORMAL LOW (ref >60)
GLUCOSE: 164 mg/dL — AB (ref 65–99)
Potassium: 5 mmol/L (ref 3.5–5.1)
SODIUM: 140 mmol/L (ref 135–145)
Total Bilirubin: 0.3 mg/dL (ref 0.3–1.2)
Total Protein: 8.2 g/dL — ABNORMAL HIGH (ref 6.5–8.1)

## 2017-06-08 LAB — CBC
HCT: 37.5 % (ref 36.0–46.0)
Hemoglobin: 12.6 g/dL (ref 12.0–15.0)
MCH: 28.3 pg (ref 26.0–34.0)
MCHC: 33.6 g/dL (ref 30.0–36.0)
MCV: 84.3 fL (ref 78.0–100.0)
PLATELETS: 380 10*3/uL (ref 150–400)
RBC: 4.45 MIL/uL (ref 3.87–5.11)
RDW: 16.3 % — ABNORMAL HIGH (ref 11.5–15.5)
WBC: 12.4 10*3/uL — AB (ref 4.0–10.5)

## 2017-06-08 LAB — HEMOGLOBIN A1C
Hgb A1c MFr Bld: 6.7 % — ABNORMAL HIGH (ref 4.8–5.6)
Mean Plasma Glucose: 145.59 mg/dL

## 2017-06-08 LAB — PROTIME-INR
INR: 0.98
Prothrombin Time: 12.9 seconds (ref 11.4–15.2)

## 2017-06-08 LAB — APTT: aPTT: 32 seconds (ref 24–36)

## 2017-06-08 NOTE — Progress Notes (Signed)
  Echocardiogram 2D Echocardiogram has been performed.  Merrie Roof F 06/08/2017, 12:27 PM

## 2017-06-10 ENCOUNTER — Ambulatory Visit (HOSPITAL_COMMUNITY): Payer: Medicare HMO | Admitting: Certified Registered Nurse Anesthetist

## 2017-06-10 ENCOUNTER — Encounter (HOSPITAL_COMMUNITY): Payer: Self-pay | Admitting: *Deleted

## 2017-06-10 ENCOUNTER — Other Ambulatory Visit: Payer: Medicare HMO

## 2017-06-10 ENCOUNTER — Ambulatory Visit (HOSPITAL_COMMUNITY): Payer: Medicare HMO

## 2017-06-10 ENCOUNTER — Encounter: Payer: Self-pay | Admitting: Pharmacist

## 2017-06-10 ENCOUNTER — Encounter (HOSPITAL_COMMUNITY)
Admission: RE | Disposition: A | Discharge status: Home / Self Care | Payer: Self-pay | Source: Ambulatory Visit | Attending: Thoracic Surgery (Cardiothoracic Vascular Surgery)

## 2017-06-10 ENCOUNTER — Ambulatory Visit (HOSPITAL_COMMUNITY)
Admission: RE | Admit: 2017-06-10 | Discharge: 2017-06-10 | Disposition: A | Discharge status: Home / Self Care | Payer: Medicare HMO | Source: Ambulatory Visit | Attending: Thoracic Surgery (Cardiothoracic Vascular Surgery) | Admitting: Thoracic Surgery (Cardiothoracic Vascular Surgery)

## 2017-06-10 DIAGNOSIS — Z8042 Family history of malignant neoplasm of prostate: Secondary | ICD-10-CM | POA: Insufficient documentation

## 2017-06-10 DIAGNOSIS — R7611 Nonspecific reaction to tuberculin skin test without active tuberculosis: Secondary | ICD-10-CM | POA: Diagnosis not present

## 2017-06-10 DIAGNOSIS — I739 Peripheral vascular disease, unspecified: Secondary | ICD-10-CM | POA: Insufficient documentation

## 2017-06-10 DIAGNOSIS — Z801 Family history of malignant neoplasm of trachea, bronchus and lung: Secondary | ICD-10-CM | POA: Diagnosis not present

## 2017-06-10 DIAGNOSIS — M1990 Unspecified osteoarthritis, unspecified site: Secondary | ICD-10-CM | POA: Insufficient documentation

## 2017-06-10 DIAGNOSIS — Z7951 Long term (current) use of inhaled steroids: Secondary | ICD-10-CM | POA: Insufficient documentation

## 2017-06-10 DIAGNOSIS — Z8049 Family history of malignant neoplasm of other genital organs: Secondary | ICD-10-CM | POA: Diagnosis not present

## 2017-06-10 DIAGNOSIS — Z902 Acquired absence of lung [part of]: Secondary | ICD-10-CM | POA: Insufficient documentation

## 2017-06-10 DIAGNOSIS — Z9981 Dependence on supplemental oxygen: Secondary | ICD-10-CM | POA: Diagnosis not present

## 2017-06-10 DIAGNOSIS — Z9842 Cataract extraction status, left eye: Secondary | ICD-10-CM | POA: Diagnosis not present

## 2017-06-10 DIAGNOSIS — R0600 Dyspnea, unspecified: Secondary | ICD-10-CM | POA: Insufficient documentation

## 2017-06-10 DIAGNOSIS — E119 Type 2 diabetes mellitus without complications: Secondary | ICD-10-CM | POA: Diagnosis not present

## 2017-06-10 DIAGNOSIS — Z9221 Personal history of antineoplastic chemotherapy: Secondary | ICD-10-CM | POA: Diagnosis not present

## 2017-06-10 DIAGNOSIS — Z885 Allergy status to narcotic agent status: Secondary | ICD-10-CM | POA: Insufficient documentation

## 2017-06-10 DIAGNOSIS — C8590 Non-Hodgkin lymphoma, unspecified, unspecified site: Secondary | ICD-10-CM

## 2017-06-10 DIAGNOSIS — Z87891 Personal history of nicotine dependence: Secondary | ICD-10-CM | POA: Insufficient documentation

## 2017-06-10 DIAGNOSIS — F419 Anxiety disorder, unspecified: Secondary | ICD-10-CM | POA: Insufficient documentation

## 2017-06-10 DIAGNOSIS — Z419 Encounter for procedure for purposes other than remedying health state, unspecified: Secondary | ICD-10-CM

## 2017-06-10 DIAGNOSIS — C8512 Unspecified B-cell lymphoma, intrathoracic lymph nodes: Secondary | ICD-10-CM | POA: Insufficient documentation

## 2017-06-10 DIAGNOSIS — Z9841 Cataract extraction status, right eye: Secondary | ICD-10-CM | POA: Diagnosis not present

## 2017-06-10 DIAGNOSIS — Z85118 Personal history of other malignant neoplasm of bronchus and lung: Secondary | ICD-10-CM | POA: Insufficient documentation

## 2017-06-10 DIAGNOSIS — J309 Allergic rhinitis, unspecified: Secondary | ICD-10-CM | POA: Diagnosis not present

## 2017-06-10 DIAGNOSIS — R002 Palpitations: Secondary | ICD-10-CM | POA: Insufficient documentation

## 2017-06-10 DIAGNOSIS — Z823 Family history of stroke: Secondary | ICD-10-CM | POA: Diagnosis not present

## 2017-06-10 DIAGNOSIS — J439 Emphysema, unspecified: Secondary | ICD-10-CM | POA: Diagnosis not present

## 2017-06-10 DIAGNOSIS — Z90710 Acquired absence of both cervix and uterus: Secondary | ICD-10-CM | POA: Insufficient documentation

## 2017-06-10 DIAGNOSIS — I10 Essential (primary) hypertension: Secondary | ICD-10-CM | POA: Insufficient documentation

## 2017-06-10 DIAGNOSIS — K219 Gastro-esophageal reflux disease without esophagitis: Secondary | ICD-10-CM | POA: Insufficient documentation

## 2017-06-10 DIAGNOSIS — Z888 Allergy status to other drugs, medicaments and biological substances status: Secondary | ICD-10-CM | POA: Insufficient documentation

## 2017-06-10 DIAGNOSIS — Z8249 Family history of ischemic heart disease and other diseases of the circulatory system: Secondary | ICD-10-CM | POA: Insufficient documentation

## 2017-06-10 DIAGNOSIS — Z79899 Other long term (current) drug therapy: Secondary | ICD-10-CM | POA: Insufficient documentation

## 2017-06-10 DIAGNOSIS — C3412 Malignant neoplasm of upper lobe, left bronchus or lung: Secondary | ICD-10-CM | POA: Diagnosis not present

## 2017-06-10 HISTORY — PX: PORTACATH PLACEMENT: SHX2246

## 2017-06-10 LAB — GLUCOSE, CAPILLARY
GLUCOSE-CAPILLARY: 145 mg/dL — AB (ref 65–99)
GLUCOSE-CAPILLARY: 156 mg/dL — AB (ref 65–99)

## 2017-06-10 SURGERY — INSERTION, TUNNELED CENTRAL VENOUS DEVICE, WITH PORT
Anesthesia: Monitor Anesthesia Care | Laterality: Left

## 2017-06-10 MED ORDER — LIDOCAINE 2% (20 MG/ML) 5 ML SYRINGE
INTRAMUSCULAR | Status: AC
  Filled 2017-06-10: qty 10

## 2017-06-10 MED ORDER — ONDANSETRON HCL 4 MG/2ML IJ SOLN
INTRAMUSCULAR | Status: AC
  Filled 2017-06-10: qty 2

## 2017-06-10 MED ORDER — GLYCOPYRROLATE 0.2 MG/ML IJ SOLN
INTRAMUSCULAR | Status: DC | PRN
  Administered 2017-06-10: 0.2 mg via INTRAVENOUS

## 2017-06-10 MED ORDER — LIDOCAINE HCL (PF) 1 % IJ SOLN
INTRAMUSCULAR | Status: AC
  Filled 2017-06-10: qty 30

## 2017-06-10 MED ORDER — PROPOFOL 1000 MG/100ML IV EMUL
INTRAVENOUS | Status: AC
  Filled 2017-06-10: qty 400

## 2017-06-10 MED ORDER — ROCURONIUM BROMIDE 10 MG/ML (PF) SYRINGE
PREFILLED_SYRINGE | INTRAVENOUS | Status: AC
  Filled 2017-06-10: qty 10

## 2017-06-10 MED ORDER — FENTANYL CITRATE (PF) 100 MCG/2ML IJ SOLN: 25.0000 ug | INTRAMUSCULAR | Status: DC | PRN

## 2017-06-10 MED ORDER — PROPOFOL 500 MG/50ML IV EMUL
INTRAVENOUS | Status: DC | PRN
  Administered 2017-06-10: 75 ug/kg/min via INTRAVENOUS

## 2017-06-10 MED ORDER — OXYCODONE HCL 5 MG PO TABS: 5.0000 mg | ORAL_TABLET | Freq: Four times a day (QID) | ORAL | 0 refills | Status: AC | PRN

## 2017-06-10 MED ORDER — OXYCODONE HCL 5 MG PO TABS: 5.0000 mg | ORAL_TABLET | Freq: Four times a day (QID) | ORAL | Status: DC | PRN

## 2017-06-10 MED ORDER — CEFAZOLIN SODIUM-DEXTROSE 2-4 GM/100ML-% IV SOLN
2.0000 g | INTRAVENOUS | Status: AC
  Administered 2017-06-10: 2 g via INTRAVENOUS

## 2017-06-10 MED ORDER — LIDOCAINE HCL 1 % IJ SOLN
INTRAMUSCULAR | Status: DC | PRN
  Administered 2017-06-10: 10 mL

## 2017-06-10 MED ORDER — LACTATED RINGERS IV SOLN
INTRAVENOUS | Status: DC
Start: 2017-06-10 — End: 2017-06-10
  Administered 2017-06-10: 13:00:00 via INTRAVENOUS

## 2017-06-10 MED ORDER — FENTANYL CITRATE (PF) 250 MCG/5ML IJ SOLN
INTRAMUSCULAR | Status: AC
  Filled 2017-06-10: qty 5

## 2017-06-10 MED ORDER — HEPARIN SODIUM (PORCINE) 1000 UNIT/ML IJ SOLN
INTRAMUSCULAR | Status: DC | PRN
  Administered 2017-06-10: 2000 [IU] via INTRAVENOUS

## 2017-06-10 MED ORDER — ONDANSETRON HCL 4 MG/2ML IJ SOLN
INTRAMUSCULAR | Status: DC | PRN
  Administered 2017-06-10: 4 mg via INTRAVENOUS

## 2017-06-10 MED ORDER — LIDOCAINE HCL (CARDIAC) PF 100 MG/5ML IV SOSY
PREFILLED_SYRINGE | INTRAVENOUS | Status: DC | PRN
  Administered 2017-06-10: 100 mg via INTRAVENOUS

## 2017-06-10 MED ORDER — CEFAZOLIN SODIUM-DEXTROSE 2-4 GM/100ML-% IV SOLN
INTRAVENOUS | Status: AC
  Filled 2017-06-10: qty 100

## 2017-06-10 MED ORDER — SODIUM CHLORIDE 0.9 % IV SOLN
INTRAVENOUS | Status: DC | PRN
  Administered 2017-06-10: 14:00:00

## 2017-06-10 MED ORDER — PROPOFOL 10 MG/ML IV BOLUS
INTRAVENOUS | Status: DC | PRN
  Administered 2017-06-10: 30 mg via INTRAVENOUS

## 2017-06-10 MED ORDER — HEPARIN SODIUM (PORCINE) 1000 UNIT/ML IJ SOLN
INTRAMUSCULAR | Status: AC
  Filled 2017-06-10: qty 1

## 2017-06-10 MED ORDER — SODIUM CHLORIDE 0.9 % IV SOLN
INTRAVENOUS | Status: AC
  Filled 2017-06-10: qty 1.2

## 2017-06-10 MED ORDER — PHENYLEPHRINE HCL 10 MG/ML IJ SOLN
INTRAMUSCULAR | Status: DC | PRN
  Administered 2017-06-10 (×3): 80 ug via INTRAVENOUS

## 2017-06-10 MED ORDER — FENTANYL CITRATE (PF) 100 MCG/2ML IJ SOLN
INTRAMUSCULAR | Status: DC | PRN
  Administered 2017-06-10: 50 ug via INTRAVENOUS

## 2017-06-10 SURGICAL SUPPLY — 36 items
BAG DECANTER FOR FLEXI CONT (MISCELLANEOUS) ×3 IMPLANT
CANISTER SUCT 3000ML PPV (MISCELLANEOUS) ×3 IMPLANT
COVER SURGICAL LIGHT HANDLE (MISCELLANEOUS) ×3 IMPLANT
DERMABOND ADVANCED (GAUZE/BANDAGES/DRESSINGS) ×2
DERMABOND ADVANCED .7 DNX12 (GAUZE/BANDAGES/DRESSINGS) ×1 IMPLANT
DRAPE C-ARM 42X72 X-RAY (DRAPES) ×3 IMPLANT
DRAPE CHEST BREAST 15X10 FENES (DRAPES) ×3 IMPLANT
ELECT REM PT RETURN 9FT ADLT (ELECTROSURGICAL) ×3
ELECTRODE REM PT RTRN 9FT ADLT (ELECTROSURGICAL) ×1 IMPLANT
GLOVE SURG SIGNA 7.5 PF LTX (GLOVE) ×3 IMPLANT
GOWN STRL REUS W/ TWL LRG LVL3 (GOWN DISPOSABLE) ×1 IMPLANT
GOWN STRL REUS W/ TWL XL LVL3 (GOWN DISPOSABLE) ×1 IMPLANT
GOWN STRL REUS W/TWL LRG LVL3 (GOWN DISPOSABLE) ×2
GOWN STRL REUS W/TWL XL LVL3 (GOWN DISPOSABLE) ×2
GUIDEWIRE UNCOATED ST S 7038 (WIRE) IMPLANT
INTRODUCER 13FR (INTRODUCER) IMPLANT
INTRODUCER COOK 11FR (CATHETERS) IMPLANT
KIT BASIN OR (CUSTOM PROCEDURE TRAY) ×3 IMPLANT
KIT PORT POWER ISP 8FR (Stent) ×3 IMPLANT
KIT TURNOVER KIT B (KITS) ×3 IMPLANT
NEEDLE HYPO 25GX1X1/2 BEV (NEEDLE) ×3 IMPLANT
NS IRRIG 1000ML POUR BTL (IV SOLUTION) ×3 IMPLANT
PACK GENERAL/GYN (CUSTOM PROCEDURE TRAY) ×3 IMPLANT
PAD ARMBOARD 7.5X6 YLW CONV (MISCELLANEOUS) ×6 IMPLANT
SET SHEATH INTRODUCER 10FR (MISCELLANEOUS) IMPLANT
SUT MNCRL AB 4-0 PS2 18 (SUTURE) IMPLANT
SUT VIC AB 3-0 SH 27 (SUTURE) ×10
SUT VIC AB 3-0 SH 27X BRD (SUTURE) ×5 IMPLANT
SUT VICRYL 4-0 PS2 18IN ABS (SUTURE) ×3 IMPLANT
SYR 20CC LL (SYRINGE) ×3 IMPLANT
SYR 5ML LL (SYRINGE) ×3 IMPLANT
SYR CONTROL 10ML LL (SYRINGE) ×3 IMPLANT
TOWEL GREEN STERILE (TOWEL DISPOSABLE) ×3 IMPLANT
TOWEL GREEN STERILE FF (TOWEL DISPOSABLE) ×3 IMPLANT
WATER STERILE IRR 1000ML POUR (IV SOLUTION) ×3 IMPLANT
WIRE .035 3MM-J 145CM (WIRE) IMPLANT

## 2017-06-10 NOTE — Anesthesia Postprocedure Evaluation (Signed)
Anesthesia Post Note  Patient: [name] [name]  Procedure(s) Performed: INSERTION PORT-A-CATH (Left )     Patient location during evaluation: PACU Anesthesia Type: MAC Level of consciousness: awake and alert Pain management: pain level controlled Vital Signs Assessment: post-procedure vital signs reviewed and stable Respiratory status: spontaneous breathing, nonlabored ventilation, respiratory function stable and patient connected to nasal cannula oxygen Cardiovascular status: stable and blood pressure returned to baseline Postop Assessment: no apparent nausea or vomiting Anesthetic complications: no    Last Vitals:  Vitals:   06/10/17 1617 06/10/17 1620  BP: (!) 150/87   Pulse: 70   Resp: 18   Temp:  (!) 36.1 C  SpO2: 95%     Last Pain:  Vitals:   06/10/17 1620  TempSrc:   PainSc: 0-No pain                 Tiajuana Amass

## 2017-06-10 NOTE — Op Note (Signed)
[name] [name], KREEGER MEDICAL RECORD DX:[id number] ACCOUNT [id number] DATE OF BIRTH:1933/02/26 FACILITY: [facility] LOCATION: [facility] PHYSICIAN:Attie Nawabi C. Mishon [name], MD  OPERATIVE REPORT  DATE OF PROCEDURE:  06/10/2017  PREOPERATIVE DIAGNOSIS:  B-cell lymphoma. Needs intravenous access for chemotherapy.  POSTOPERATIVE DIAGNOSIS:  B-cell lymphoma. needs intravenous access for chemotherapy.  PROCEDURE:  Port-A-Cath placement, left subclavian vein.  SURGEON:  Modesto Charon, MD  ASSISTANT:  None.  ANESTHESIA:  Local with intravenous sedation.  FINDINGS:  Vein accessed on first pass.  Catheter flushed easily.  CLINICAL NOTE:  The patient is an 84 year old woman with a history of lung cancer.  She recently was found to have adenopathy.  Biopsy showed lymphoma.  She needs IV access for chemotherapy.  She was advised to have Port-A-Cath placement.  The  indications, risks, benefits, and alternatives were discussed in detail with the patient.  She understood and accepts the risks and agreed to proceed.  DESCRIPTION OF PROCEDURE:  The patient was brought to the operating room on 06/10/2017.  She had intravenous sedation administered and monitored by anesthesia.  Intravenous antibiotics were administered.  The chest and neck were prepped and draped in the  usual sterile fashion.  After performing a timeout, the patient was placed in Trendelenburg position.  Ten mL of 1% lidocaine was used to anesthetize the operative field.  The left subclavian vein was accessed using the Seldinger technique.  The vein was accessed on the first  pass.  The wire passed easily.  Fluoroscopy confirmed the wire in the right atrium.  An incision was made incorporating the wire and then a pocket was created.  Hemostasis was achieved with electrocautery.  There was good hemostasis.  The catheter was  cut to  20 cm, attached to the port and the port and catheter were preflushed with heparinized saline.  The  Peel-Away sheath introducer was advanced over the wire.  The inner cannula was removed.  The catheter was advanced through the introducer, which was then  removed.  There was good return from the catheter and it flushed easily.  Fluoroscopy confirmed position of the tip of the catheter in the junction of the superior vena cava and right atrium.  The port was secured to the pectoralis fascia with three 3-0  Vicryl sutures.  The port was flushed with 2 mL of concentrated heparin.  The subcutaneous tissue and skin were closed in standard fashion.  Dermabond was applied.  The patient was taken from the operating room to the [facility] in good  condition.  AN/NUANCE  D:06/10/2017 T:06/10/2017 JOB:[id number]

## 2017-06-10 NOTE — Transfer of Care (Signed)
Immediate Anesthesia Transfer of Care Note  Patient: [name] [name]  Procedure(s) Performed: INSERTION PORT-A-CATH (Left )  Patient Location: PACU  Anesthesia Type:MAC  Level of Consciousness: awake, alert  and oriented  Airway & Oxygen Therapy: Patient Spontanous Breathing  Post-op Assessment: Report given to RN, Post -op Vital signs reviewed and stable and Patient moving all extremities X 4  Post vital signs: Reviewed and stable  Last Vitals:  Vitals Value Taken Time  BP 146/77 06/10/2017  4:02 PM  Temp    Pulse 72 06/10/2017  4:07 PM  Resp 19 06/10/2017  4:07 PM  SpO2 99 % 06/10/2017  4:07 PM  Vitals shown include unvalidated device data.  Last Pain:  Vitals:   06/10/17 1238  TempSrc: Oral      Patients Stated Pain Goal: 5 (67/54/49 2010)  Complications: No apparent anesthesia complications

## 2017-06-10 NOTE — H&P (Signed)
[name] [name] is an 84 y.o. female.   Chief Complaint: Needs chemo HPI: 84 yo woman with history  Of tobacco abuse, COPD, stage IIA non-small cell lung cancer s/p left upper lobectomy and chemotherapy, hypertension, diabetes was recently found to have right hilar, mediastinal and axillary adenopathy. Biopsy + for lymphoma. Needs IV access for chemo.  She is chronically dyspneic and on home O2.  No complaints at present  Past Medical History:  Diagnosis Date  . Allergic rhinitis   . Anxiety   . Arthritis   . Cancer ([facility])   . COPD (chronic obstructive pulmonary disease) ([facility])   . Diabetes mellitus without complication ([facility])    no meds     . Dyspnea   . Emphysema of lung ([facility])   . GERD (gastroesophageal reflux disease)   . History of positive PPD, untreated   . HTN (hypertension)   . Palpitations   . Primary cancer of left upper lobe of lung ([facility]) 05/23/2014   Stage IIA- T2b,N0- thoracoscopic left upper lobectomy 05/04/14  . Requires supplemental oxygen    3 liters  prn  . SOB (shortness of breath)     Past Surgical History:  Procedure Laterality Date  . CATARACT EXTRACTION W/ INTRAOCULAR LENS  IMPLANT, BILATERAL    . LEAD REMOVAL Left 05/04/2014   Procedure: CRYO INTERCOSTAL NERVE BLOCK;  Surgeon: [name], MD;  Location: [facility];  Service: Thoracic;  Laterality: Left;  . LUNG LOBECTOMY  05/04/14   left upper lobectomy  . NODE DISSECTION Left 05/04/2014   Procedure: NODE DISSECTION;  Surgeon: [name], MD;  Location: [facility];  Service: Thoracic;  Laterality: Left;  Marland Kitchen VAGINAL HYSTERECTOMY  1966  . VIDEO ASSISTED THORACOSCOPY (VATS)/ LOBECTOMY Left 05/04/2014   Procedure: LEFT VIDEO ASSISTED THORACOSCOPY (VATS)/LEFT UPPER LOBECTOMY;  Surgeon: [name], MD;  Location: [facility];  Service: Thoracic;  Laterality: Left;    Family History  Problem Relation Age of Onset  . Cervical cancer Mother   . Prostate cancer Father   . Heart disease Father   .  Stroke Maternal Grandmother   . Angina Maternal Grandfather   . Prostate cancer Paternal Uncle   . Lung cancer Brother   . Lung disease Neg Hx    Social History:  reports that she quit smoking about 3 years ago. Her smoking use included cigarettes. She started smoking about 72 years ago. She has a 52.50 pack-year smoking history. She has quit using smokeless tobacco. She reports that she does not drink alcohol or use drugs.  Allergies:  Allergies  Allergen Reactions  . Lipitor [Atorvastatin] Other (See Comments)    myalgia's  . Darvon [Propoxyphene Hcl]     UNSPECIFIED REACTION     Medications Prior to Admission  Medication Sig Dispense Refill  . allopurinol (ZYLOPRIM) 100 MG tablet Take 1 tablet (100 mg total) by mouth 2 (two) times daily. 60 tablet 1  . ALPRAZolam (XANAX) 0.5 MG tablet Take 0.5 mg by mouth 3 (three) times daily as needed for anxiety. Reported on 03/28/2015    . esomeprazole (NEXIUM) 40 MG capsule Take 40 mg by mouth daily as needed.     . Fluticasone-Umeclidin-Vilant (TRELEGY ELLIPTA) 100-62.5-25 MCG/INH AEPB Inhale 1 puff daily into the lungs. 1 each 6  . losartan (COZAAR) 50 MG tablet Take 100 mg by mouth daily.    . metoprolol tartrate (LOPRESSOR) 25 MG tablet Take 25 mg by mouth 2 (two) times daily.    Marland Kitchen  montelukast (SINGULAIR) 10 MG tablet Take 1 tablet (10 mg total) at bedtime by mouth. (Patient taking differently: Take 10 mg by mouth at bedtime as needed. ) 30 tablet 11  . multivitamin-iron-minerals-folic acid (CENTRUM) chewable tablet Chew 1 tablet by mouth daily.    . potassium chloride (K-DUR,KLOR-CON) 10 MEQ tablet Take 1 tablet by mouth daily.    . predniSONE (DELTASONE) 50 MG tablet Take 2 tabs (100 mg) daily X5 days with each cycle of chemo. 30 tablet 1  . VENTOLIN HFA 108 (90 BASE) MCG/ACT inhaler Inhale 1-2 puffs into the lungs every 4 (four) hours as needed for shortness of breath.     . fluorometholone (FML) 0.1 % ophthalmic suspension Place 1 drop  into both eyes daily.    . Fluticasone-Salmeterol (ADVAIR DISKUS) 250-50 MCG/DOSE AEPB Take 2 puffs by mouth 2 (two) times daily as needed.    . lidocaine-prilocaine (EMLA) cream Apply 1 application topically as needed. 30 g 0  . loratadine (CLARITIN) 10 MG tablet Take 10 mg by mouth daily.    . prochlorperazine (COMPAZINE) 10 MG tablet Take 1 tablet (10 mg total) by mouth every 6 (six) hours as needed for nausea or vomiting. 30 tablet 0  . tiZANidine (ZANAFLEX) 2 MG tablet Take 1 tablet by mouth daily as needed.    . traMADol (ULTRAM) 50 MG tablet Take 50 mg by mouth 2 (two) times daily as needed.      Results for orders placed or performed during the hospital encounter of 06/10/17 (from the past 48 hour(s))  Glucose, capillary     Status: Abnormal   Collection Time: 06/10/17  1:14 PM  Result Value Ref Range   Glucose-Capillary 145 (H) 65 - 99 mg/dL   Dg Chest 2 View  Result Date: 06/09/2017 CLINICAL DATA:  Lymphoma.  Preoperative. EXAM: CHEST - 2 VIEW COMPARISON:  05/11/2017 PET-CT.  01/07/2017 chest radiograph FINDINGS: Stable cardiomediastinal silhouette with normal heart size. No pneumothorax. Stable left costophrenic angle pleural-parenchymal scarring. No pleural effusions. No pulmonary edema. Asymmetric prominence of the right hilum, increased since 01/07/2017. Hazy patchy right midlung opacity appears chronic. No acute consolidative airspace disease. IMPRESSION: 1. Asymmetric prominence of the right hilum correlating with known hypermetabolic right hilar adenopathy as seen on recent PET-CT study. 2. No acute cardiopulmonary disease. Stable chronic hazy right mid lung opacity suggesting scarring. Stable left costophrenic angle pleural-parenchymal scarring. Electronically Signed   By: Ilona Sorrel M.D.   On: 06/09/2017 08:02    Review of Systems  Respiratory: Positive for shortness of breath. Negative for cough and wheezing.   Cardiovascular: Positive for chest pain.    Blood pressure  (!) 177/86, pulse 69, temperature (!) 97.5 F (36.4 C), temperature source Oral, resp. rate 18, height 5' 2.5" (1.588 m), weight 201 lb 9.6 oz (91.4 kg), SpO2 99 %. Physical Exam  Vitals reviewed. Constitutional: She is oriented to person, place, and time. She appears well-developed and well-nourished. No distress.  HENT:  Head: Normocephalic and atraumatic.  Mouth/Throat: No oropharyngeal exudate.  Eyes: Conjunctivae and EOM are normal.  Neck: No thyromegaly present.  Cardiovascular: Normal rate, regular rhythm and normal heart sounds.  No murmur heard. Respiratory: Effort normal and breath sounds normal. No respiratory distress. She has no wheezes. She has no rales.  GI: Soft. She exhibits no distension. There is no tenderness.  Neurological: She is alert and oriented to person, place, and time. No cranial nerve deficit. She exhibits normal muscle tone.  Skin: Skin is warm  and dry.     Assessment/Plan 84 yo woman with history of lung cancer, now found to have a B cell Non-Hodgkins lymphoma who needs IV access for chemotherapy. She has been advised to have a Port-a-cath placed for access.  I informed her of the general nature of the procedure and the incision to be used. I informed her of the indications, risks, benefits and alternatives. She understands the risks include but are not limited to bleeding, pneumothorax, infection, catheter malposition, catheter occlusion and venous thrombosis.  She accepts the risks and agrees to proceed.  [name], MD 06/10/2017, 1:53 PM

## 2017-06-10 NOTE — Anesthesia Preprocedure Evaluation (Signed)
Anesthesia Evaluation  Patient identified by MRN, date of birth, ID band Patient awake    Reviewed: Allergy & Precautions, NPO status , Patient's Chart, lab work & pertinent test results, reviewed documented beta blocker date and time   Airway Mallampati: II  TM Distance: >3 FB Neck ROM: Full    Dental   Pulmonary COPD, former smoker,    breath sounds clear to auscultation       Cardiovascular hypertension, Pt. on medications and Pt. on home beta blockers + Peripheral Vascular Disease   Rhythm:Regular Rate:Normal     Neuro/Psych Anxiety negative neurological ROS     GI/Hepatic Neg liver ROS, GERD  Medicated,  Endo/Other  diabetes, Type 2  Renal/GU negative Renal ROS     Musculoskeletal  (+) Arthritis ,   Abdominal   Peds  Hematology negative hematology ROS (+)   Anesthesia Other Findings   Reproductive/Obstetrics                             Lab Results  Component Value Date   WBC 12.4 (H) 06/08/2017   HGB 12.6 06/08/2017   HCT 37.5 06/08/2017   MCV 84.3 06/08/2017   PLT 380 06/08/2017   Lab Results  Component Value Date   CREATININE 1.11 (H) 06/08/2017   BUN 16 06/08/2017   NA 140 06/08/2017   K 5.0 06/08/2017   CL 108 06/08/2017   CO2 22 06/08/2017    Anesthesia Physical Anesthesia Plan  ASA: III  Anesthesia Plan: MAC   Post-op Pain Management:    Induction: Intravenous  PONV Risk Score and Plan: 2 and Ondansetron, Propofol infusion and Treatment may vary due to age or medical condition  Airway Management Planned: Natural Airway and Simple Face Mask  Additional Equipment:   Intra-op Plan:   Post-operative Plan:   Informed Consent: I have reviewed the patients History and Physical, chart, labs and discussed the procedure including the risks, benefits and alternatives for the proposed anesthesia with the patient or authorized representative who has indicated  his/her understanding and acceptance.     Plan Discussed with: CRNA  Anesthesia Plan Comments:         Anesthesia Quick Evaluation

## 2017-06-10 NOTE — Discharge Instructions (Signed)
Do not drive or engage in heavy physical activity for 24 hours.  You may resume normal activities tomorrow  You may shower tomorrow.  You have a prescription for oxycodone, a narcotic pain reliever, you may use as directed. You may use acetaminophen (Tylenol) in addition to or instead of the oxycodone  My office will contact you with a follow up appointment  Call [phone] if you develop chest pain, shortness of breath, fever > 101 F or notice excessive pain, swelling, redness or drainage from the incision.

## 2017-06-10 NOTE — Brief Op Note (Signed)
06/10/2017  3:51 PM  PATIENT:  [name] [name]  84 y.o. female  PRE-OPERATIVE DIAGNOSIS:  LYMPHOMA  POST-OPERATIVE DIAGNOSIS:  LYMPHOMA  PROCEDURE:  Procedure(s): INSERTION PORT-A-CATH (Left)  SURGEON:  Surgeon(s) and Role:    * [name], MD - Primary  PHYSICIAN ASSISTANT:   ASSISTANTS: none   ANESTHESIA:   none  EBL:  minimal  BLOOD ADMINISTERED:none  DRAINS: none   LOCAL MEDICATIONS USED:  LIDOCAINE  and Amount: 10 ml  SPECIMEN:  No Specimen  DISPOSITION OF SPECIMEN:  N/A  COUNTS:  YES  TOURNIQUET:  * No tourniquets in log *  DICTATION: .Other Dictation: Dictation Number -  PLAN OF CARE: Discharge to home after PACU  PATIENT DISPOSITION:  PACU - hemodynamically stable.   Delay start of Pharmacological VTE agent (>24hrs) due to surgical blood loss or risk of bleeding: not applicable

## 2017-06-11 ENCOUNTER — Other Ambulatory Visit: Payer: Self-pay | Admitting: Medical Oncology

## 2017-06-11 ENCOUNTER — Encounter (HOSPITAL_COMMUNITY): Payer: Self-pay | Admitting: Thoracic Surgery (Cardiothoracic Vascular Surgery)

## 2017-06-11 DIAGNOSIS — C8332 Diffuse large B-cell lymphoma, intrathoracic lymph nodes: Secondary | ICD-10-CM

## 2017-06-12 ENCOUNTER — Inpatient Hospital Stay: Payer: Medicare HMO | Attending: Internal Medicine

## 2017-06-12 ENCOUNTER — Inpatient Hospital Stay: Payer: Medicare HMO

## 2017-06-12 VITALS — BP 116/46 | HR 65 | Temp 98.0°F | Resp 16

## 2017-06-12 DIAGNOSIS — C8332 Diffuse large B-cell lymphoma, intrathoracic lymph nodes: Secondary | ICD-10-CM

## 2017-06-12 DIAGNOSIS — E119 Type 2 diabetes mellitus without complications: Secondary | ICD-10-CM | POA: Diagnosis not present

## 2017-06-12 DIAGNOSIS — K219 Gastro-esophageal reflux disease without esophagitis: Secondary | ICD-10-CM | POA: Insufficient documentation

## 2017-06-12 DIAGNOSIS — K5900 Constipation, unspecified: Secondary | ICD-10-CM | POA: Insufficient documentation

## 2017-06-12 DIAGNOSIS — Z79899 Other long term (current) drug therapy: Secondary | ICD-10-CM | POA: Insufficient documentation

## 2017-06-12 DIAGNOSIS — J449 Chronic obstructive pulmonary disease, unspecified: Secondary | ICD-10-CM | POA: Diagnosis not present

## 2017-06-12 DIAGNOSIS — R5383 Other fatigue: Secondary | ICD-10-CM | POA: Insufficient documentation

## 2017-06-12 DIAGNOSIS — I10 Essential (primary) hypertension: Secondary | ICD-10-CM | POA: Insufficient documentation

## 2017-06-12 DIAGNOSIS — R350 Frequency of micturition: Secondary | ICD-10-CM | POA: Diagnosis not present

## 2017-06-12 DIAGNOSIS — M1990 Unspecified osteoarthritis, unspecified site: Secondary | ICD-10-CM | POA: Insufficient documentation

## 2017-06-12 DIAGNOSIS — Z5111 Encounter for antineoplastic chemotherapy: Secondary | ICD-10-CM | POA: Diagnosis not present

## 2017-06-12 DIAGNOSIS — R300 Dysuria: Secondary | ICD-10-CM | POA: Diagnosis not present

## 2017-06-12 DIAGNOSIS — R0602 Shortness of breath: Secondary | ICD-10-CM | POA: Diagnosis not present

## 2017-06-12 DIAGNOSIS — Z9981 Dependence on supplemental oxygen: Secondary | ICD-10-CM | POA: Diagnosis not present

## 2017-06-12 DIAGNOSIS — C3412 Malignant neoplasm of upper lobe, left bronchus or lung: Secondary | ICD-10-CM | POA: Diagnosis present

## 2017-06-12 DIAGNOSIS — Z7689 Persons encountering health services in other specified circumstances: Secondary | ICD-10-CM | POA: Diagnosis not present

## 2017-06-12 LAB — CBC WITH DIFFERENTIAL (CANCER CENTER ONLY)
BASOS PCT: 0 %
Basophils Absolute: 0 10*3/uL (ref 0.0–0.1)
EOS ABS: 0 10*3/uL (ref 0.0–0.5)
Eosinophils Relative: 0 %
HEMATOCRIT: 34.7 % — AB (ref 34.8–46.6)
Hemoglobin: 11.4 g/dL — ABNORMAL LOW (ref 11.6–15.9)
LYMPHS ABS: 2.9 10*3/uL (ref 0.9–3.3)
Lymphocytes Relative: 22 %
MCH: 27.9 pg (ref 25.1–34.0)
MCHC: 32.9 g/dL (ref 31.5–36.0)
MCV: 85 fL (ref 79.5–101.0)
Monocytes Absolute: 0.7 10*3/uL (ref 0.1–0.9)
Monocytes Relative: 5 %
NEUTROS ABS: 9.7 10*3/uL — AB (ref 1.5–6.5)
NEUTROS PCT: 73 %
Platelet Count: 331 10*3/uL (ref 145–400)
RBC: 4.08 MIL/uL (ref 3.70–5.45)
RDW: 16.8 % — ABNORMAL HIGH (ref 11.2–14.5)
WBC: 13.3 10*3/uL — AB (ref 3.9–10.3)

## 2017-06-12 LAB — CMP (CANCER CENTER ONLY)
ALT: 9 U/L (ref 0–55)
ANION GAP: 6 (ref 3–11)
AST: 13 U/L (ref 5–34)
Albumin: 3.3 g/dL — ABNORMAL LOW (ref 3.5–5.0)
Alkaline Phosphatase: 76 U/L (ref 40–150)
BUN: 24 mg/dL (ref 7–26)
CHLORIDE: 110 mmol/L — AB (ref 98–109)
CO2: 23 mmol/L (ref 22–29)
CREATININE: 1.11 mg/dL — AB (ref 0.60–1.10)
Calcium: 9.7 mg/dL (ref 8.4–10.4)
GFR, EST AFRICAN AMERICAN: 53 mL/min — AB (ref >60)
GFR, EST NON AFRICAN AMERICAN: 45 mL/min — AB (ref >60)
Glucose, Bld: 146 mg/dL — ABNORMAL HIGH (ref 70–140)
Potassium: 3.8 mmol/L (ref 3.5–5.1)
SODIUM: 139 mmol/L (ref 136–145)
Total Bilirubin: <0.2 mg/dL — ABNORMAL LOW (ref 0.2–1.2)
Total Protein: 7.1 g/dL (ref 6.4–8.3)

## 2017-06-12 MED ORDER — SODIUM CHLORIDE 0.9% FLUSH
10.0000 mL | INTRAVENOUS | Status: DC | PRN
  Administered 2017-06-12: 10 mL
  Filled 2017-06-12: qty 10

## 2017-06-12 MED ORDER — DIPHENHYDRAMINE HCL 50 MG/ML IJ SOLN
INTRAMUSCULAR | Status: AC
  Filled 2017-06-12: qty 1

## 2017-06-12 MED ORDER — DOXORUBICIN HCL CHEMO IV INJECTION 2 MG/ML
50.0000 mg/m2 | Freq: Once | INTRAVENOUS | Status: AC
  Administered 2017-06-12: 100 mg via INTRAVENOUS
  Filled 2017-06-12: qty 50

## 2017-06-12 MED ORDER — HEPARIN SOD (PORK) LOCK FLUSH 100 UNIT/ML IV SOLN
500.0000 [IU] | Freq: Once | INTRAVENOUS | Status: AC | PRN
  Administered 2017-06-12: 500 [IU]
  Filled 2017-06-12: qty 5

## 2017-06-12 MED ORDER — ACETAMINOPHEN 325 MG PO TABS
650.0000 mg | ORAL_TABLET | Freq: Once | ORAL | Status: AC
  Administered 2017-06-12: 650 mg via ORAL

## 2017-06-12 MED ORDER — SODIUM CHLORIDE 0.9 % IV SOLN
2.0000 mg | Freq: Once | INTRAVENOUS | Status: AC
  Administered 2017-06-12: 2 mg via INTRAVENOUS
  Filled 2017-06-12: qty 2

## 2017-06-12 MED ORDER — ACETAMINOPHEN 325 MG PO TABS
ORAL_TABLET | ORAL | Status: AC
  Filled 2017-06-12: qty 2

## 2017-06-12 MED ORDER — PALONOSETRON HCL INJECTION 0.25 MG/5ML
0.2500 mg | Freq: Once | INTRAVENOUS | Status: AC
  Administered 2017-06-12: 0.25 mg via INTRAVENOUS

## 2017-06-12 MED ORDER — DEXAMETHASONE SODIUM PHOSPHATE 10 MG/ML IJ SOLN
INTRAMUSCULAR | Status: AC
  Filled 2017-06-12: qty 1

## 2017-06-12 MED ORDER — DEXAMETHASONE SODIUM PHOSPHATE 10 MG/ML IJ SOLN
10.0000 mg | Freq: Once | INTRAMUSCULAR | Status: AC
  Administered 2017-06-12: 10 mg via INTRAVENOUS

## 2017-06-12 MED ORDER — DIPHENHYDRAMINE HCL 25 MG PO CAPS
50.0000 mg | ORAL_CAPSULE | Freq: Once | ORAL | Status: AC
  Administered 2017-06-12: 50 mg via ORAL

## 2017-06-12 MED ORDER — SODIUM CHLORIDE 0.9 % IV SOLN
Freq: Once | INTRAVENOUS | Status: AC
  Administered 2017-06-12: 10:00:00 via INTRAVENOUS

## 2017-06-12 MED ORDER — SODIUM CHLORIDE 0.9 % IV SOLN
375.0000 mg/m2 | Freq: Once | INTRAVENOUS | Status: AC
  Administered 2017-06-12: 800 mg via INTRAVENOUS
  Filled 2017-06-12: qty 50

## 2017-06-12 MED ORDER — PALONOSETRON HCL INJECTION 0.25 MG/5ML
INTRAVENOUS | Status: AC
  Filled 2017-06-12: qty 5

## 2017-06-12 MED ORDER — DIPHENHYDRAMINE HCL 25 MG PO CAPS
ORAL_CAPSULE | ORAL | Status: AC
  Filled 2017-06-12: qty 2

## 2017-06-12 MED ORDER — SODIUM CHLORIDE 0.9 % IV SOLN
750.0000 mg/m2 | Freq: Once | INTRAVENOUS | Status: AC
  Administered 2017-06-12: 1500 mg via INTRAVENOUS
  Filled 2017-06-12: qty 75

## 2017-06-12 NOTE — Patient Instructions (Signed)
[facility] Discharge Instructions for Patients Receiving Chemotherapy  Today you received the following chemotherapy agents Rituxan, Acdriamycin, Vincristine, cytoxan To help prevent nausea and vomiting after your treatment, we encourage you to take your nausea medication as directed  If you develop nausea and vomiting that is not controlled by your nausea medication, call the clinic.   BELOW ARE SYMPTOMS THAT SHOULD BE REPORTED IMMEDIATELY:  *FEVER GREATER THAN 100.5 F  *CHILLS WITH OR WITHOUT FEVER  NAUSEA AND VOMITING THAT IS NOT CONTROLLED WITH YOUR NAUSEA MEDICATION  *UNUSUAL SHORTNESS OF BREATH  *UNUSUAL BRUISING OR BLEEDING  TENDERNESS IN MOUTH AND THROAT WITH OR WITHOUT PRESENCE OF ULCERS  *URINARY PROBLEMS  *BOWEL PROBLEMS  UNUSUAL RASH Items with * indicate a potential emergency and should be followed up as soon as possible.  Feel free to call the clinic should you have any questions or concerns. The clinic phone number is [phone].  Please show the [facility] at check-in to the Emergency Department and triage nurse.

## 2017-06-15 ENCOUNTER — Inpatient Hospital Stay: Payer: Medicare HMO

## 2017-06-15 VITALS — BP 162/82 | HR 80 | Temp 98.3°F | Resp 18

## 2017-06-15 DIAGNOSIS — Z5111 Encounter for antineoplastic chemotherapy: Secondary | ICD-10-CM | POA: Diagnosis not present

## 2017-06-15 DIAGNOSIS — C8332 Diffuse large B-cell lymphoma, intrathoracic lymph nodes: Secondary | ICD-10-CM

## 2017-06-15 MED ORDER — PEGFILGRASTIM-CBQV 6 MG/0.6ML ~~LOC~~ SOSY
PREFILLED_SYRINGE | SUBCUTANEOUS | Status: AC
Start: 2017-06-15 — End: 2017-06-15
  Filled 2017-06-15: qty 0.6

## 2017-06-15 MED ORDER — PEGFILGRASTIM-CBQV 6 MG/0.6ML ~~LOC~~ SOSY
6.0000 mg | PREFILLED_SYRINGE | Freq: Once | SUBCUTANEOUS | Status: AC
  Administered 2017-06-15: 6 mg via SUBCUTANEOUS

## 2017-06-15 NOTE — Patient Instructions (Signed)
Pegfilgrastim injection What is this medicine? PEGFILGRASTIM (PEG fil gra stim) is a long-acting granulocyte colony-stimulating factor that stimulates the growth of neutrophils, a type of white blood cell important in the body's fight against infection. It is used to reduce the incidence of fever and infection in patients with certain types of cancer who are receiving chemotherapy that affects the bone marrow, and to increase survival after being exposed to high doses of radiation. This medicine may be used for other purposes; ask your health care provider or pharmacist if you have questions. COMMON BRAND NAME(S): Neulasta What should I tell my health care provider before I take this medicine? They need to know if you have any of these conditions: -kidney disease -latex allergy -ongoing radiation therapy -sickle cell disease -skin reactions to acrylic adhesives (On-Body Injector only) -an unusual or allergic reaction to pegfilgrastim, filgrastim, other medicines, foods, dyes, or preservatives -pregnant or trying to get pregnant -breast-feeding How should I use this medicine? This medicine is for injection under the skin. If you get this medicine at home, you will be taught how to prepare and give the pre-filled syringe or how to use the On-body Injector. Refer to the patient Instructions for Use for detailed instructions. Use exactly as directed. Tell your healthcare provider immediately if you suspect that the On-body Injector may not have performed as intended or if you suspect the use of the On-body Injector resulted in a missed or partial dose. It is important that you put your used needles and syringes in a special sharps container. Do not put them in a trash can. If you do not have a sharps container, call your pharmacist or healthcare provider to get one. Talk to your pediatrician regarding the use of this medicine in children. While this drug may be prescribed for selected conditions,  precautions do apply. Overdosage: If you think you have taken too much of this medicine contact a poison control center or emergency room at once. NOTE: This medicine is only for you. Do not share this medicine with others. What if I miss a dose? It is important not to miss your dose. Call your doctor or health care professional if you miss your dose. If you miss a dose due to an On-body Injector failure or leakage, a new dose should be administered as soon as possible using a single prefilled syringe for manual use. What may interact with this medicine? Interactions have not been studied. Give your health care provider a list of all the medicines, herbs, non-prescription drugs, or dietary supplements you use. Also tell them if you smoke, drink alcohol, or use illegal drugs. Some items may interact with your medicine. This list may not describe all possible interactions. Give your health care provider a list of all the medicines, herbs, non-prescription drugs, or dietary supplements you use. Also tell them if you smoke, drink alcohol, or use illegal drugs. Some items may interact with your medicine. What should I watch for while using this medicine? You may need blood work done while you are taking this medicine. If you are going to need a MRI, CT scan, or other procedure, tell your doctor that you are using this medicine (On-Body Injector only). What side effects may I notice from receiving this medicine? Side effects that you should report to your doctor or health care professional as soon as possible: -allergic reactions like skin rash, itching or hives, swelling of the face, lips, or tongue -dizziness -fever -pain, redness, or irritation at site   where injected -pinpoint red spots on the skin -red or dark-brown urine -shortness of breath or breathing problems -stomach or side pain, or pain at the shoulder -swelling -tiredness -trouble passing urine or change in the amount of urine Side  effects that usually do not require medical attention (report to your doctor or health care professional if they continue or are bothersome): -bone pain -muscle pain This list may not describe all possible side effects. Call your doctor for medical advice about side effects. You may report side effects to FDA at 1-800-FDA-1088. Where should I keep my medicine? Keep out of the reach of children. Store pre-filled syringes in a refrigerator between 2 and 8 degrees C (36 and 46 degrees F). Do not freeze. Keep in carton to protect from light. Throw away this medicine if it is left out of the refrigerator for more than 48 hours. Throw away any unused medicine after the expiration date. NOTE: This sheet is a summary. It may not cover all possible information. If you have questions about this medicine, talk to your doctor, pharmacist, or health care provider.  2018 Elsevier/Gold Standard (2016-01-24 12:58:03)  

## 2017-06-16 ENCOUNTER — Other Ambulatory Visit: Payer: Self-pay | Admitting: *Deleted

## 2017-06-16 DIAGNOSIS — C3412 Malignant neoplasm of upper lobe, left bronchus or lung: Secondary | ICD-10-CM

## 2017-06-17 ENCOUNTER — Encounter: Payer: Self-pay | Admitting: Oncology

## 2017-06-17 ENCOUNTER — Inpatient Hospital Stay (HOSPITAL_BASED_OUTPATIENT_CLINIC_OR_DEPARTMENT_OTHER): Payer: Medicare HMO | Admitting: Oncology

## 2017-06-17 ENCOUNTER — Telehealth: Payer: Self-pay | Admitting: Oncology

## 2017-06-17 ENCOUNTER — Inpatient Hospital Stay: Payer: Medicare HMO

## 2017-06-17 ENCOUNTER — Other Ambulatory Visit: Payer: Self-pay | Admitting: Oncology

## 2017-06-17 VITALS — BP 125/72 | HR 97 | Temp 98.3°F | Resp 18 | Ht 62.5 in | Wt 205.7 lb

## 2017-06-17 DIAGNOSIS — K5900 Constipation, unspecified: Secondary | ICD-10-CM

## 2017-06-17 DIAGNOSIS — C3412 Malignant neoplasm of upper lobe, left bronchus or lung: Secondary | ICD-10-CM

## 2017-06-17 DIAGNOSIS — C8332 Diffuse large B-cell lymphoma, intrathoracic lymph nodes: Secondary | ICD-10-CM

## 2017-06-17 DIAGNOSIS — Z7689 Persons encountering health services in other specified circumstances: Secondary | ICD-10-CM

## 2017-06-17 DIAGNOSIS — Z5111 Encounter for antineoplastic chemotherapy: Secondary | ICD-10-CM | POA: Diagnosis not present

## 2017-06-17 DIAGNOSIS — M1990 Unspecified osteoarthritis, unspecified site: Secondary | ICD-10-CM | POA: Diagnosis not present

## 2017-06-17 DIAGNOSIS — K219 Gastro-esophageal reflux disease without esophagitis: Secondary | ICD-10-CM | POA: Diagnosis not present

## 2017-06-17 DIAGNOSIS — R5383 Other fatigue: Secondary | ICD-10-CM | POA: Diagnosis not present

## 2017-06-17 DIAGNOSIS — Z79899 Other long term (current) drug therapy: Secondary | ICD-10-CM

## 2017-06-17 DIAGNOSIS — J449 Chronic obstructive pulmonary disease, unspecified: Secondary | ICD-10-CM

## 2017-06-17 LAB — CBC WITH DIFFERENTIAL (CANCER CENTER ONLY)
BASOS PCT: 0 %
Basophils Absolute: 0 10*3/uL (ref 0.0–0.1)
EOS ABS: 0 10*3/uL (ref 0.0–0.5)
EOS PCT: 0 %
HCT: 33.4 % — ABNORMAL LOW (ref 34.8–46.6)
Hemoglobin: 11 g/dL — ABNORMAL LOW (ref 11.6–15.9)
LYMPHS PCT: 6 %
Lymphs Abs: 0.9 10*3/uL (ref 0.9–3.3)
MCH: 27.9 pg (ref 25.1–34.0)
MCHC: 32.9 g/dL (ref 31.5–36.0)
MCV: 85 fL (ref 79.5–101.0)
MONO ABS: 0.2 10*3/uL (ref 0.1–0.9)
Monocytes Relative: 1 %
Neutro Abs: 15.3 10*3/uL — ABNORMAL HIGH (ref 1.5–6.5)
Neutrophils Relative %: 93 %
PLATELETS: 208 10*3/uL (ref 145–400)
RBC: 3.93 MIL/uL (ref 3.70–5.45)
RDW: 16.8 % — AB (ref 11.2–14.5)
WBC Count: 16.4 10*3/uL — ABNORMAL HIGH (ref 3.9–10.3)

## 2017-06-17 LAB — CMP (CANCER CENTER ONLY)
ALT: 13 U/L (ref 0–55)
AST: 15 U/L (ref 5–34)
Albumin: 3.1 g/dL — ABNORMAL LOW (ref 3.5–5.0)
Alkaline Phosphatase: 94 U/L (ref 40–150)
Anion gap: 8 (ref 3–11)
BUN: 14 mg/dL (ref 7–26)
CHLORIDE: 108 mmol/L (ref 98–109)
CO2: 23 mmol/L (ref 22–29)
Calcium: 8.9 mg/dL (ref 8.4–10.4)
Creatinine: 0.97 mg/dL (ref 0.60–1.10)
GFR, EST NON AFRICAN AMERICAN: 53 mL/min — AB (ref >60)
GFR, Est AFR Am: >60 mL/min (ref >60)
Glucose, Bld: 187 mg/dL — ABNORMAL HIGH (ref 70–140)
POTASSIUM: 4.1 mmol/L (ref 3.5–5.1)
Sodium: 139 mmol/L (ref 136–145)
Total Bilirubin: 0.5 mg/dL (ref 0.2–1.2)
Total Protein: 6.5 g/dL (ref 6.4–8.3)

## 2017-06-17 NOTE — Progress Notes (Signed)
[facility] OFFICE PROGRESS NOTE  [place] [id number]  DIAGNOSIS: 1) Stage IIA (T2b, N0, M0) non-small cell lung cancer, moderately to poorly differentiated squamous cell carcinoma diagnosed in April 2016. 2) Diffuse large B-cell lymphoma diagnosed in April 2019.  PRIOR THERAPY: 1) Status post left upper lobectomy with lymph node dissection under the care of [name] on 05/04/2014. 2) Adjuvant chemotherapy with carboplatin for an AUC of 5 and paclitaxel 175 mg/m given every 3 weeks with Neulasta support. Status post 4 cycles. Last dose was given 08/26/2014.  CURRENT THERAPY: Systemic chemotherapy with Rituxan 375 mg meter squared, Cytoxan 750 mg meter squared, Adriamycin 50 mg/m, vincristine 2 mg, and prednisone 100 mg days 1 through 5 of each cycle of treatment.  Chemotherapy will be given every 3 weeks. First dose 06/12/2017.  Status post 1 cycle.  INTERVAL HISTORY: Zoey Bidwell 84 y.o. female returns for a routine follow-up visit coming by her son and daughter.  The patient received her first cycle of chemotherapy last week and tolerated it fairly well.  She does complain of mild fatigue.  She also notices some increase in her reflux symptoms.  She has not been taking her Nexium.  She also reports that she was taking her prednisone on empty stomach.  She had some arthralgias related to her Neulasta injection.  The patient denies fevers and chills.  Denies chest pain, cough, hemoptysis.  She has her baseline shortness of breath and wears home oxygen as needed.  Denies nausea, vomiting, diarrhea.  She had constipation and took milk of magnesia which was effective.  Denies recent weight loss or night sweats.  The patient is here for evaluation and repeat lab work.  MEDICAL HISTORY: Past Medical History:  Diagnosis Date  . Allergic rhinitis   . Anxiety   . Arthritis   . Cancer ([place])   . COPD (chronic obstructive pulmonary disease)  ([place])   . Diabetes mellitus without complication (HCC)    no meds     . Dyspnea   . Emphysema of lung ([place])   . GERD (gastroesophageal reflux disease)   . History of positive PPD, untreated   . HTN (hypertension)   . Palpitations   . Primary cancer of left upper lobe of lung ([place]) 05/23/2014   Stage IIA- T2b,N0- thoracoscopic left upper lobectomy 05/04/14  . Requires supplemental oxygen    3 liters  prn  . SOB (shortness of breath)     ALLERGIES:  is allergic to lipitor [atorvastatin] and darvon [propoxyphene hcl].  MEDICATIONS:  Current Outpatient Medications  Medication Sig Dispense Refill  . allopurinol (ZYLOPRIM) 100 MG tablet Take 1 tablet (100 mg total) by mouth 2 (two) times daily. 60 tablet 1  . ALPRAZolam (XANAX) 0.5 MG tablet Take 0.5 mg by mouth 3 (three) times daily as needed for anxiety. Reported on 03/28/2015    . esomeprazole (NEXIUM) 40 MG capsule Take 40 mg by mouth daily as needed.     . fluorometholone (FML) 0.1 % ophthalmic suspension Place 1 drop into both eyes daily.    . Fluticasone-Salmeterol (ADVAIR DISKUS) 250-50 MCG/DOSE AEPB Take 2 puffs by mouth 2 (two) times daily as needed.    . Fluticasone-Umeclidin-Vilant (TRELEGY ELLIPTA) 100-62.5-25 MCG/INH AEPB Inhale 1 puff daily into the lungs. 1 each 6  . lidocaine-prilocaine (EMLA) cream Apply 1 application topically as needed. 30 g 0  . loratadine (CLARITIN) 10 MG tablet Take 10 mg by mouth daily.    Marland Kitchen  losartan (COZAAR) 50 MG tablet Take 100 mg by mouth daily.    . metoprolol tartrate (LOPRESSOR) 25 MG tablet Take 25 mg by mouth 2 (two) times daily.    . montelukast (SINGULAIR) 10 MG tablet Take 1 tablet (10 mg total) at bedtime by mouth. (Patient taking differently: Take 10 mg by mouth at bedtime as needed. ) 30 tablet 11  . multivitamin-iron-minerals-folic acid (CENTRUM) chewable tablet Chew 1 tablet by mouth daily.    Marland Kitchen oxyCODONE (OXY IR/ROXICODONE) 5 MG immediate release tablet Take 1 tablet (5 mg total) by  mouth every 6 (six) hours as needed for moderate pain or severe pain. 30 tablet 0  . potassium chloride (K-DUR,KLOR-CON) 10 MEQ tablet Take 1 tablet by mouth daily.    . predniSONE (DELTASONE) 50 MG tablet Take 2 tabs (100 mg) daily X5 days with each cycle of chemo. 30 tablet 1  . prochlorperazine (COMPAZINE) 10 MG tablet Take 1 tablet (10 mg total) by mouth every 6 (six) hours as needed for nausea or vomiting. 30 tablet 0  . tiZANidine (ZANAFLEX) 2 MG tablet Take 1 tablet by mouth daily as needed.    . traMADol (ULTRAM) 50 MG tablet Take 50 mg by mouth 2 (two) times daily as needed.    . VENTOLIN HFA 108 (90 BASE) MCG/ACT inhaler Inhale 1-2 puffs into the lungs every 4 (four) hours as needed for shortness of breath.      No current facility-administered medications for this visit.     SURGICAL HISTORY:  Past Surgical History:  Procedure Laterality Date  . CATARACT EXTRACTION W/ INTRAOCULAR LENS  IMPLANT, BILATERAL    . LEAD REMOVAL Left 05/04/2014   Procedure: CRYO INTERCOSTAL NERVE BLOCK;  Surgeon: [name], MD;  Location: [facility];  Service: Thoracic;  Laterality: Left;  . LUNG LOBECTOMY  05/04/14   left upper lobectomy  . NODE DISSECTION Left 05/04/2014   Procedure: NODE DISSECTION;  Surgeon: [name], MD;  Location: [facility];  Service: Thoracic;  Laterality: Left;  . PORTACATH PLACEMENT Left 06/10/2017   Procedure: INSERTION PORT-A-CATH;  Surgeon: [name], MD;  Location: [facility];  Service: Thoracic;  Laterality: Left;  Marland Kitchen VAGINAL HYSTERECTOMY  1966  . VIDEO ASSISTED THORACOSCOPY (VATS)/ LOBECTOMY Left 05/04/2014   Procedure: LEFT VIDEO ASSISTED THORACOSCOPY (VATS)/LEFT UPPER LOBECTOMY;  Surgeon: [name], MD;  Location: [facility];  Service: Thoracic;  Laterality: Left;    REVIEW OF SYSTEMS:   Review of Systems  Constitutional: Negative for appetite change, chills, fever and unexpected weight change. Positive for fatigue. HENT:   Negative for mouth  sores, nosebleeds, sore throat and trouble swallowing.   Eyes: Negative for eye problems and icterus.  Respiratory: Negative for cough, hemoptysis, and wheezing.  Positive for her baseline shortness of breath.  She wears home oxygen as needed.  Cardiovascular: Negative for chest pain and leg swelling.  Gastrointestinal: Negative for abdominal pain, diarrhea, nausea and vomiting. Positive for increased reflux symptoms. Genitourinary: Negative for bladder incontinence, difficulty urinating, dysuria, frequency and hematuria.   Musculoskeletal: Positive for arthralgias related to Neulasta.  Skin: Negative for itching and rash.  Neurological: Negative for dizziness, extremity weakness, gait problem, headaches, light-headedness and seizures.  Hematological: Negative for adenopathy. Does not bruise/bleed easily.  Psychiatric/Behavioral: Negative for confusion, depression and sleep disturbance. The patient is not nervous/anxious.     PHYSICAL EXAMINATION:  Blood pressure 125/72, pulse 97, temperature 98.3 F (36.8 C), temperature source Oral, resp. rate 18, height 5'  2.5" (1.588 m), weight 205 lb 11.2 oz (93.3 kg), SpO2 95 %.  ECOG PERFORMANCE STATUS: 1 - Symptomatic but completely ambulatory  Physical Exam  Constitutional: Oriented to person, place, and time and well-developed, well-nourished, and in no distress. No distress.  HENT:  Head: Normocephalic and atraumatic.  Mouth/Throat: Oropharynx is clear and moist. No oropharyngeal exudate.  Eyes: Conjunctivae are normal. Right eye exhibits no discharge. Left eye exhibits no discharge. No scleral icterus.  Neck: Normal range of motion. Neck supple.  Cardiovascular: Normal rate, regular rhythm, normal heart sounds and intact distal pulses.   Pulmonary/Chest: Effort normal and breath sounds normal. No respiratory distress. No wheezes. No rales.  Abdominal: Soft. Bowel sounds are normal. Exhibits no distension and no mass. There is no tenderness.   Musculoskeletal: Normal range of motion. Exhibits no edema.  Lymphadenopathy:    No cervical adenopathy.  Neurological: Alert and oriented to person, place, and time. Exhibits normal muscle tone. Gait normal. Coordination normal.  Skin: Skin is warm and dry. No rash noted. Not diaphoretic. No erythema. No pallor.  Psychiatric: Mood, memory and judgment normal.  Vitals reviewed.  LABORATORY DATA: Lab Results  Component Value Date   WBC 16.4 (H) 06/17/2017   HGB 11.0 (L) 06/17/2017   HCT 33.4 (L) 06/17/2017   MCV 85.0 06/17/2017   PLT 208 06/17/2017      Chemistry      Component Value Date/Time   NA 139 06/17/2017 0935   NA 144 01/05/2017 1017   K 4.1 06/17/2017 0935   K 3.8 01/05/2017 1017   CL 108 06/17/2017 0935   CO2 23 06/17/2017 0935   CO2 23 01/05/2017 1017   BUN 14 06/17/2017 0935   BUN 21.5 01/05/2017 1017   CREATININE 0.97 06/17/2017 0935   CREATININE 1.2 (H) 01/05/2017 1017      Component Value Date/Time   CALCIUM 8.9 06/17/2017 0935   CALCIUM 9.6 01/05/2017 1017   ALKPHOS 94 06/17/2017 0935   ALKPHOS 104 01/05/2017 1017   AST 15 06/17/2017 0935   AST 15 01/05/2017 1017   ALT 13 06/17/2017 0935   ALT 9 01/05/2017 1017   BILITOT 0.5 06/17/2017 0935   BILITOT 0.37 01/05/2017 1017       RADIOGRAPHIC STUDIES:  Dg Chest 2 View  Result Date: 06/09/2017 CLINICAL DATA:  Lymphoma.  Preoperative. EXAM: CHEST - 2 VIEW COMPARISON:  05/11/2017 PET-CT.  01/07/2017 chest radiograph FINDINGS: Stable cardiomediastinal silhouette with normal heart size. No pneumothorax. Stable left costophrenic angle pleural-parenchymal scarring. No pleural effusions. No pulmonary edema. Asymmetric prominence of the right hilum, increased since 01/07/2017. Hazy patchy right midlung opacity appears chronic. No acute consolidative airspace disease. IMPRESSION: 1. Asymmetric prominence of the right hilum correlating with known hypermetabolic right hilar adenopathy as seen on recent PET-CT  study. 2. No acute cardiopulmonary disease. Stable chronic hazy right mid lung opacity suggesting scarring. Stable left costophrenic angle pleural-parenchymal scarring. Electronically Signed   By: Ilona Sorrel M.D.   On: 06/09/2017 08:02   Mr Jeri Cos BZ Contrast  Result Date: 05/25/2017 CLINICAL DATA:  Primary cancer of left upper lobe. Stage IIA non-small cell lung cancer. EXAM: MRI HEAD WITHOUT AND WITH CONTRAST TECHNIQUE: Multiplanar, multiecho pulse sequences of the brain and surrounding structures were obtained without and with intravenous contrast. CONTRAST:  62mL MULTIHANCE GADOBENATE DIMEGLUMINE 529 MG/ML IV SOLN COMPARISON:  MRI brain 04/17/2014 FINDINGS: Brain: Atrophy and white matter changes are mildly advanced for age. No acute infarct, hemorrhage, or mass lesion  is present. There is no focal enhancing lesion to suggest metastatic disease of the brain or meninges. The internal auditory canals are within normal limits bilaterally. Brainstem and cerebellum are normal. Vascular: Flow is present in the major intracranial arteries. Skull and upper cervical spine: Cystic changes at the right petrous apex is stable. There is no associated enhancement. The skull base is normal. Grade 1 anterolisthesis at C2-3 with deformity of the C2 base likely reflecting remote fracture is stable. Degenerative changes are present at C3-4. Sinuses/Orbits: The paranasal sinuses and mastoid air cells are clear. Bilateral lens replacements are present. Globes and orbits are otherwise within normal limits. IMPRESSION: 1. No metastatic disease to the brain or meninges. 2. Stable atrophy and white matter disease. 3. Stable posttraumatic changes of the upper cervical spine at C2. No acute abnormality in the cervical spine. Electronically Signed   By: San Morelle M.D.   On: 05/25/2017 08:06   Dg Fluoro Guide Cv Line-no Report  Result Date: 06/10/2017 Fluoroscopy was utilized by the requesting physician.  No  radiographic interpretation.   [name] Core Biopsy (lymph Nodes)  Result Date: 05/20/2017 INDICATION: 84 year old female with a past history of lung cancer. Recent PET imaging is concerning for local and axillary nodal recurrence. She presents for ultrasound-guided core biopsy of the right axillary lymph node. EXAM: Ultrasound-guided core biopsy lymph node MEDICATIONS: None. ANESTHESIA/SEDATION: Moderate (conscious) sedation was employed during this procedure. A total of Versed 1.5 mg and Fentanyl 25 mcg was administered intravenously. Moderate Sedation Time: 11 minutes. The patient's level of consciousness and vital signs were monitored continuously by radiology nursing throughout the procedure under my direct supervision. FLUOROSCOPY TIME:  Fluoroscopy Time: 0 minutes 0 seconds (0 mGy). COMPLICATIONS: None immediate. PROCEDURE: Informed written consent was obtained from the patient after a thorough discussion of the procedural risks, benefits and alternatives. All questions were addressed. Maximal Sterile Barrier Technique was utilized including caps, mask, sterile gowns, sterile gloves, sterile drape, hand hygiene and skin antiseptic. A timeout was performed prior to the initiation of the procedure. The right axillary region was interrogated with ultrasound. A 1.8 x 0.8 cm lymph node was identified. A suitable skin entry site was selected and marked. Local anesthesia was attained by infiltration with 1% lidocaine. A small dermatotomy was made. Under real-time sonographic guidance, multiple 18 gauge core biopsies were obtained of the cortex of the lymph node. Biopsy specimens were placed in saline and delivered to pathology for further analysis. Post biopsy ultrasound imaging demonstrates no evidence of immediate complication. The patient tolerated the procedure well. IMPRESSION: Technically successful ultrasound-guided core biopsy of right axillary lymph node. Signed, Criselda Peaches, MD [facility] Electronically Signed   By: Jacqulynn Cadet M.D.   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  She is currently on treatment with R-CHOP.  Status post 1 cycle which she tolerated fairly well with the exception of fatigue, increased reflux symptoms, constipation, and arthralgias related to Neulasta. Labs reviewed with the patient and her family and are stable.  We will continue to watch her labs weekly.  Recommend that she take Nexium daily on the 5 days that she takes prednisone and as needed thereafter.  We discussed that she should take her prednisone with food and not on empty stomach.  For arthralgias related to Neulasta, recommend  she continue Claritin.  We also discussed that she may use Tylenol or ibuprofen if needed.  She has prescription tramadol already and she was advised that she may use this if Tylenol or ibuprofen are not effective.  The patient will follow-up in 2 weeks for evaluation prior to cycle #2 of her treatment.  Primary cancer of left upper lobe of lung ([place]) Stage IIA non-small cell lung cancer status post left upper lobectomy with lymph node dissection followed by 4 cycles of adjuvant systemic chemotherapy with carboplatin and paclitaxel The patient has been on observation since July 2016. She had repeat CT scan of the chest performed recentlywhichshowed progressive right hilar and infrahilar nodes suspicious for disease recurrence. She had a recent PET scan and subsequent biopsy which showed diffuse large B-cell lymphoma. The patient will remain on observation for her lung cancer.  We will be performing periodic CT scans while she is receiving treatment for her lymphoma.  For COPD, she will continue her current follow-up visit and  evaluation by Dr. Elsworth Soho.  For hypertension, I strongly encouraged the patient to take her medication as prescribed and to check it regularly at home and report to her primary care physician for adjustment of her medications.  She was advised to call immediately if she has any concerning symptoms in the interval. The patient voices understanding of current disease status and treatment options and is in agreement with the current care plan. All questions were answered. The patient knows to call the clinic with any problems, questions or concerns. We can certainly see the patient much sooner if necessary.   No orders of the defined types were placed in this encounter.  Mikey Bussing, DNP, AGPCNP-BC, AOCNP 06/17/17

## 2017-06-17 NOTE — Assessment & Plan Note (Addendum)
This is a pleasant 84 year old African-American female with newly diagnosed diffuse large B-cell lymphoma.  She is currently on treatment with R-CHOP.  Status post 1 cycle which she tolerated fairly well with the exception of fatigue, increased reflux symptoms, constipation, and arthralgias related to Neulasta. Labs reviewed with the patient and her family and are stable.  We will continue to watch her labs weekly.  Recommend that she take Nexium daily on the 5 days that she takes prednisone and as needed thereafter.  We discussed that she should take her prednisone with food and not on empty stomach.  For arthralgias related to Neulasta, recommend she continue Claritin.  We also discussed that she may use Tylenol or ibuprofen if needed.  She has prescription tramadol already and she was advised that she may use this if Tylenol or ibuprofen are not effective.  The patient will follow-up in 2 weeks for evaluation prior to cycle #2 of her treatment.

## 2017-06-17 NOTE — Telephone Encounter (Signed)
Scheduled appt per 5/8 los - pt to get an updated schedule next visit.

## 2017-06-17 NOTE — Assessment & Plan Note (Signed)
Stage IIA non-small cell lung cancer status post left upper lobectomy with lymph node dissection followed by 4 cycles of adjuvant systemic chemotherapy with carboplatin and paclitaxel The patient has been on observation since July 2016. She had repeat CT scan of the chest performed recentlywhichshowed progressive right hilar and infrahilar nodes suspicious for disease recurrence. She had a recent PET scan and subsequent biopsy which showed diffuse large B-cell lymphoma. The patient will remain on observation for her lung cancer.  We will be performing periodic CT scans while she is receiving treatment for her lymphoma.  For COPD, she will continue her current follow-up visit and evaluation by Dr. Elsworth Soho.  For hypertension, I strongly encouraged the patient to take her medication as prescribed and to check it regularly at home and report to her primary care physician for adjustment of her medications.  She was advised to call immediately if she has any concerning symptoms in the interval. The patient voices understanding of current disease status and treatment options and is in agreement with the current care plan. All questions were answered. The patient knows to call the clinic with any problems, questions or concerns. We can certainly see the patient much sooner if necessary.

## 2017-06-24 ENCOUNTER — Inpatient Hospital Stay: Payer: Medicare HMO

## 2017-06-24 ENCOUNTER — Other Ambulatory Visit: Payer: Medicare HMO

## 2017-06-24 DIAGNOSIS — C8332 Diffuse large B-cell lymphoma, intrathoracic lymph nodes: Secondary | ICD-10-CM

## 2017-06-24 DIAGNOSIS — Z978 Presence of other specified devices: Secondary | ICD-10-CM | POA: Insufficient documentation

## 2017-06-24 DIAGNOSIS — Z95828 Presence of other vascular implants and grafts: Secondary | ICD-10-CM

## 2017-06-24 DIAGNOSIS — Z5111 Encounter for antineoplastic chemotherapy: Secondary | ICD-10-CM | POA: Diagnosis not present

## 2017-06-24 LAB — URIC ACID: URIC ACID, SERUM: 5 mg/dL (ref 2.6–7.4)

## 2017-06-24 LAB — CMP (CANCER CENTER ONLY)
ALK PHOS: 107 U/L (ref 40–150)
ALT: 16 U/L (ref 0–55)
ANION GAP: 6 (ref 3–11)
AST: 19 U/L (ref 5–34)
Albumin: 2.8 g/dL — ABNORMAL LOW (ref 3.5–5.0)
BILIRUBIN TOTAL: 0.2 mg/dL (ref 0.2–1.2)
BUN: 12 mg/dL (ref 7–26)
CALCIUM: 9.3 mg/dL (ref 8.4–10.4)
CO2: 25 mmol/L (ref 22–29)
CREATININE: 1.27 mg/dL — AB (ref 0.60–1.10)
Chloride: 104 mmol/L (ref 98–109)
GFR, EST AFRICAN AMERICAN: 45 mL/min — AB (ref >60)
GFR, Estimated: 38 mL/min — ABNORMAL LOW (ref >60)
Glucose, Bld: 193 mg/dL — ABNORMAL HIGH (ref 70–140)
Potassium: 4 mmol/L (ref 3.5–5.1)
Sodium: 135 mmol/L — ABNORMAL LOW (ref 136–145)
TOTAL PROTEIN: 6.7 g/dL (ref 6.4–8.3)

## 2017-06-24 LAB — CBC WITH DIFFERENTIAL (CANCER CENTER ONLY)
Basophils Absolute: 0.1 10*3/uL (ref 0.0–0.1)
Basophils Relative: 0 %
Eosinophils Absolute: 0 10*3/uL (ref 0.0–0.5)
Eosinophils Relative: 0 %
HCT: 29.6 % — ABNORMAL LOW (ref 34.8–46.6)
HEMOGLOBIN: 10 g/dL — AB (ref 11.6–15.9)
LYMPHS ABS: 1.5 10*3/uL (ref 0.9–3.3)
LYMPHS PCT: 8 %
MCH: 28.1 pg (ref 25.1–34.0)
MCHC: 33.8 g/dL (ref 31.5–36.0)
MCV: 83.1 fL (ref 79.5–101.0)
MONOS PCT: 8 %
Monocytes Absolute: 1.4 10*3/uL — ABNORMAL HIGH (ref 0.1–0.9)
NEUTROS ABS: 15.1 10*3/uL — AB (ref 1.5–6.5)
NEUTROS PCT: 84 %
Platelet Count: 181 10*3/uL (ref 145–400)
RBC: 3.56 MIL/uL — AB (ref 3.70–5.45)
RDW: 16.5 % — ABNORMAL HIGH (ref 11.2–14.5)
WBC: 18.1 10*3/uL — AB (ref 3.9–10.3)

## 2017-06-24 LAB — LACTATE DEHYDROGENASE: LDH: 308 U/L — ABNORMAL HIGH (ref 125–245)

## 2017-06-24 MED ORDER — HEPARIN SOD (PORK) LOCK FLUSH 100 UNIT/ML IV SOLN
500.0000 [IU] | Freq: Once | INTRAVENOUS | Status: AC
  Administered 2017-06-24: 500 [IU]
  Filled 2017-06-24: qty 5

## 2017-06-24 MED ORDER — SODIUM CHLORIDE 0.9% FLUSH
10.0000 mL | Freq: Once | INTRAVENOUS | Status: AC
  Administered 2017-06-24: 10 mL
  Filled 2017-06-24: qty 10

## 2017-06-26 ENCOUNTER — Ambulatory Visit (INDEPENDENT_AMBULATORY_CARE_PROVIDER_SITE_OTHER): Payer: Medicare HMO | Admitting: Pulmonary Disease

## 2017-06-26 ENCOUNTER — Encounter: Payer: Self-pay | Admitting: Pulmonary Disease

## 2017-06-26 VITALS — BP 116/78 | HR 98 | Ht 61.0 in | Wt 200.2 lb

## 2017-06-26 DIAGNOSIS — J301 Allergic rhinitis due to pollen: Secondary | ICD-10-CM

## 2017-06-26 DIAGNOSIS — C3412 Malignant neoplasm of upper lobe, left bronchus or lung: Secondary | ICD-10-CM

## 2017-06-26 DIAGNOSIS — J449 Chronic obstructive pulmonary disease, unspecified: Secondary | ICD-10-CM

## 2017-06-26 DIAGNOSIS — J9611 Chronic respiratory failure with hypoxia: Secondary | ICD-10-CM | POA: Diagnosis not present

## 2017-06-26 MED ORDER — MONTELUKAST SODIUM 10 MG PO TABS: 10.0000 mg | ORAL_TABLET | Freq: Every day | ORAL | 11 refills | Status: AC

## 2017-06-26 MED ORDER — FLUTICASONE-UMECLIDIN-VILANT 100-62.5-25 MCG/INH IN AEPB: 1.0000 | INHALATION_SPRAY | Freq: Every day | RESPIRATORY_TRACT | 0 refills | Status: AC

## 2017-06-26 MED ORDER — FLUTICASONE-UMECLIDIN-VILANT 100-62.5-25 MCG/INH IN AEPB: 1.0000 | INHALATION_SPRAY | Freq: Every day | RESPIRATORY_TRACT | 6 refills | Status: AC

## 2017-06-26 MED ORDER — VENTOLIN HFA 108 (90 BASE) MCG/ACT IN AERS: 1.0000 | INHALATION_SPRAY | RESPIRATORY_TRACT | 5 refills | Status: AC | PRN

## 2017-06-26 NOTE — Progress Notes (Signed)
@Patient  ID: [name] [name], female    DOB: 05-08-1933, 84 y.o.   MRN: [id number]  Chief Complaint  Patient presents with  . Follow-up    COPD - out of Trelegy    Referring provider: Nolene Ebbs, MD  HPI: 84 year old with mild COPD and lung cancer.  Currently being managed by Dr. Elsworth Soho.  Has previously been followed by Dr. Melvyn Novas and Dr. Ashok Cordia.  Status post left upper lobectomy 05/04/2014 by [name].  Stage IIa (T2b, N0, M0) treated with adjuvant chemotherapy and carboplatin and paclitaxel. Diffuse large B-cell lymphoma diagnosed in April 2019  Former smoker.  Quit date 2016.  52.5 pack years.  Previous Recent [facility] encounters:   02/25/2017 office visit Dr. Elsworth Soho Doing well on 3 L with exertion, not using oxygen when sleeping.  VQ scan on 12/2016 was low probability.  CT chest 12/2016 was reviewed showing mild interstitial lung disease with some honeycombing changes, previous nodule noted does not look nodular any more significant right apical emphysema..  12/26/2016-office visit-[facility]- reporting that her breathing has improved since starting trilogy.  Not noticing any symptomatic relief with her albuterol inhaler.  Viewed CT scan with her today showing significant emphysema bilaterally.  VQ scan ordered to help evaluate hypoxia.  Instructed patient to have follow-up with cardiology.  Continue on trilogy.  3 L of oxygen with exertion.  Continue Claritin started on Singulair.  Continue follow-up with medical oncology for NSCLC   Tests:  PFT 11/26/16: FVC 2.42 L (140%) FEV1 1.51 (114%) FEV1/FVC0.62 FEF 25-75  0.80 L (71%) negative bronchodilator response  3.48 L (74%) RV 47% DLCO corrected 34% 03/22/14: FVC 2.77 L (132%) FEV1 1.57 L (102%) FEV1/FVC 0.57 FEF 25-75 0.72 L (47%)   6MWT 12/26/16:  Walked 144 meters / Baseline Sat 93% on RA / Nadir Sat 85% on RA @ 4:52 (required 3 L/m to maintain with exertion)  IMAGING 06/08/2017-chest x-ray- asymmetric prominence of the right  ilium correlating with known hypermetabolic right hilar adenopathy as seen on recent PET CT study, no acute cardiopulmonary disease right mid lung opacity suggesting scarring 05/25/2017-MRI brain-no metastatic disease to the brain or meninges, stable atrophy and white matter disease  05/11/2017-PET- right hilar mediastinal adenopathy demonstrating marked hypermetabolism and consistent with recurrent or new lung cancer, no findings for recurrent left lung cancer, hypermetabolic left and right axillary lymph nodes, left rectum suspicious for polyp CT CHEST W/ CONTRAST 10/03/16:  Status post left upper lobectomy. 1.2 cm partially groundglass nodule in right lower lobe noted which appears more solid compared with imaging performed previously and shows signs of some progression. Subcentimeter nodules in right lung also noted without obvious enlargement. No pathologic mediastinal adenopathy. Pleural thickening bilaterally but most notably on the right. No obvious pleural effusion or pericardial effusion. Atypical predominant emphysematous changes. CT CHEST W/ CONTRAST 03/31/16:  Volume loss on the left with apical predominant emphysematous changes. Right hilar lymph node appears enlarged. No pathologic mediastinal adenopathy however. No pleural effusion or thickening. No pericardial effusion. Slight nodular density within right upper lobe appreciated.  CARDIAC 06/08/2017 echocardiogram-LV ejection fraction 60 to 64%, grade 1 diastolic dysfunction, mild LVH TTE (01/21/12): LV with mild LVH & EF 55-60%. LA & RA normal in size. RV normal in size and function. No aortic stenosis or regurgitation. Aortic root normal in size. Trivial mitral regurgitation without stenosis. No significant pulmonic regurgitation. Trivial tricuspid regurgitation. No pericardial effusion.  PATHOLOGY Left Upper Lobe Resection & Lymph Node Dissection (05/04/14): Invasive moderately to poorly  differentiated squamous cell carcinoma spanning 6 cm in  greatest dimension. Margins negative. Focal superficial visceropleural invasion identified. Levels 10L, 11L, 7, & 5 lymph nodes biopsied without malignancy or atypia. pT2b, pN0.  LABS 11/17/16 - Alpha-1 antitrypsin: MM (217) 11/17/16 - RAST Panel:  Dog 0.13, D farine 0.18, & D pternoyssinus 0.31  11/17/16 - IgE:  173 05/02/14- ABG on RA:  7.42 / 35 / 69 / 93%  Other:  06/10/2017-insertion of Port-A-Cath 01/07/2017-VQ scan-low probability for PE  06/26/17 Office Visit She presents the office today with daughter.  Complaining of worsening shortness of breath over the last week.  Patient admits that she has been out of her trilogy for the last week as well as her Ventolin inhaler is almost out.  Patient has also stopped taking Singulair.  Patient has been having close follow-up with oncology.  Recent PET scan in April diagnosed with diffuse large B-cell lymphoma.  Patient using oxygen as needed with exertion.  Patient arrived to office today and oxygen was off and oxygen saturation was 84%.  Patient also reported unsure she is been taking the trilogy correctly.  Will review all inhalers, and how to take today.  Emphasized importance of wearing oxygen.  Patient going to buy SPO2 monitor from pharmacy in order to check oxygen saturations at home.  Pulmonary rehab is currently on hold due to close follow-up with oncology will resume pulmonary rehab once oncology appointment slow and patient reaches out.   Allergies  Allergen Reactions  . Lipitor [Atorvastatin] Other (See Comments)    myalgia's  . Darvon [Propoxyphene Hcl]     UNSPECIFIED REACTION     Immunization History  Administered Date(s) Administered  . Influenza,inj,Quad PF,6+ Mos 03/22/2013  . Influenza-Unspecified 03/07/2016, 04/17/2017  . Pneumococcal Polysaccharide-23 03/22/2013    Past Medical History:  Diagnosis Date  . Allergic rhinitis   . Anxiety   . Arthritis   . Cancer ([place])   . COPD (chronic obstructive pulmonary disease)  ([place])   . Diabetes mellitus without complication (H[facility])    no meds     . Dyspnea   . Emphysema of lung ([place])   . GERD (gastroesophageal reflux disease)   . History of positive PPD, untreated   . HTN (hypertension)   . Palpitations   . Primary cancer of left upper lobe of lung ([place]) 05/23/2014   Stage IIA- T2b,N0- thoracoscopic left upper lobectomy 05/04/14  . Requires supplemental oxygen    3 liters  prn  . SOB (shortness of breath)     Tobacco History: Social History   Tobacco Use  Smoking Status Former Smoker  . Packs/day: 0.75  . Years: 70.00  . Pack years: 52.50  . Types: Cigarettes  . Start date: 01/02/1945  . Last attempt to quit: 05/03/2014  . Years since quitting: 3.1  Smokeless Tobacco Former User  Tobacco Comment   Quit 05/03/14   Counseling given: Not Answered Comment: Quit 05/03/14   Outpatient Encounter Medications as of 06/26/2017  Medication Sig  . allopurinol (ZYLOPRIM) 100 MG tablet Take 1 tablet (100 mg total) by mouth 2 (two) times daily.  Marland Kitchen esomeprazole (NEXIUM) 40 MG capsule Take 40 mg by mouth daily as needed.   . Fluticasone-Umeclidin-Vilant (TRELEGY ELLIPTA) 100-62.5-25 MCG/INH AEPB Inhale 1 puff into the lungs daily.  Marland Kitchen lidocaine-prilocaine (EMLA) cream Apply 1 application topically as needed.  . loratadine (CLARITIN) 10 MG tablet Take 10 mg by mouth daily.  Marland Kitchen losartan (COZAAR) 50 MG tablet Take 100 mg  by mouth daily.  . metoprolol tartrate (LOPRESSOR) 25 MG tablet Take 25 mg by mouth 2 (two) times daily.  . montelukast (SINGULAIR) 10 MG tablet Take 1 tablet (10 mg total) by mouth at bedtime.  . multivitamin-iron-minerals-folic acid (CENTRUM) chewable tablet Chew 1 tablet by mouth daily.  Marland Kitchen oxyCODONE (OXY IR/ROXICODONE) 5 MG immediate release tablet Take 1 tablet (5 mg total) by mouth every 6 (six) hours as needed for moderate pain or severe pain.  . potassium chloride (K-DUR,KLOR-CON) 10 MEQ tablet Take 1 tablet by mouth daily.  . predniSONE  (DELTASONE) 50 MG tablet Take 2 tabs (100 mg) daily X5 days with each cycle of chemo.  . prochlorperazine (COMPAZINE) 10 MG tablet Take 1 tablet (10 mg total) by mouth every 6 (six) hours as needed for nausea or vomiting.  . traMADol (ULTRAM) 50 MG tablet Take 50 mg by mouth 2 (two) times daily as needed.  . VENTOLIN HFA 108 (90 Base) MCG/ACT inhaler Inhale 1-2 puffs into the lungs every 4 (four) hours as needed for shortness of breath.  . [DISCONTINUED] Fluticasone-Umeclidin-Vilant (TRELEGY ELLIPTA) 100-62.5-25 MCG/INH AEPB Inhale 1 puff daily into the lungs.  . [DISCONTINUED] montelukast (SINGULAIR) 10 MG tablet Take 1 tablet (10 mg total) at bedtime by mouth. (Patient taking differently: Take 10 mg by mouth at bedtime as needed. )  . [DISCONTINUED] VENTOLIN HFA 108 (90 BASE) MCG/ACT inhaler Inhale 1-2 puffs into the lungs every 4 (four) hours as needed for shortness of breath.   . ALPRAZolam (XANAX) 0.5 MG tablet Take 0.5 mg by mouth 3 (three) times daily as needed for anxiety. Reported on 03/28/2015  . tiZANidine (ZANAFLEX) 2 MG tablet Take 1 tablet by mouth daily as needed.  . [DISCONTINUED] fluorometholone (FML) 0.1 % ophthalmic suspension Place 1 drop into both eyes daily.  . [DISCONTINUED] Fluticasone-Salmeterol (ADVAIR DISKUS) 250-50 MCG/DOSE AEPB Take 2 puffs by mouth 2 (two) times daily as needed.   No facility-administered encounter medications on file as of 06/26/2017.      Review of Systems  Constitutional:   +occasional fatigue, No  weight loss, night sweats,  Fevers, chills  HEENT: +nasal congestion  No headaches,  Difficulty swallowing,  Tooth/dental problems, or  Sore throat, No sneezing, itching, ear ache  CV:  +occasional chest pain with sob with exertion without oxygen - resolves with rest,  No Orthopnea, PND, swelling in lower extremities, anasarca, dizziness, palpitations, syncope.   GI:  No heartburn, indigestion, abdominal pain, nausea, vomiting, diarrhea, change in  bowel habits, loss of appetite, bloody stools.   Resp: + Occasional start shortness of breath with exertion increased over the last week no shortness of breath with exertion or at rest. Intermittant wheezing  No excess mucus, no productive cough,  No non-productive cough,  No coughing up of blood.  No change in color of mucus. No chest wall deformity   Skin: no rash or lesions.  GU: no dysuria, change in color of urine, no urgency or frequency.  No flank pain, no hematuria   MS:  No joint pain or swelling.  No decreased range of motion.  No back pain.    Physical Exam  BP 116/78 (BP Location: Left Arm, Cuff Size: Normal)   Pulse 98   Ht 5\' 1"  (1.549 m)   Wt 200 lb 3.2 oz (90.8 kg)   SpO2 98%   BMI 37.83 kg/m   GEN: A/Ox3; pleasant , NAD, well nourished    HEENT:  [place]/AT,  EACs-clear -moderate cerumen  bilaterally, TMs-wnl, NOSE- +erythematous, THROAT- +postnasal drip, no lesions,   NECK:  Supple w/ fair ROM; no JVD; no thyromegaly or nodules palpated; no lymphadenopathy.    RESP : +inspiratory and expiratory wheezes scattered in upper lobes, diminished sounds in the bases, no accessory muscle use, no dullness to percussion  CARD:  RRR, no m/r/g, no peripheral edema, pulses intact, no cyanosis or clubbing.  GI:   Soft & nt; nml bowel sounds; no organomegaly or masses detected.   Musco: Warm bil, no deformities or joint swelling noted.   Neuro: alert, no focal deficits noted.    Skin: Warm, no lesions or rashes    Lab Results:  CBC    Component Value Date/Time   WBC 18.1 (H) 06/24/2017 1051   WBC 12.4 (H) 06/08/2017 1400   RBC 3.56 (L) 06/24/2017 1051   HGB 10.0 (L) 06/24/2017 1051   HGB 11.6 01/05/2017 1017   HCT 29.6 (L) 06/24/2017 1051   HCT 35.4 01/05/2017 1017   PLT 181 06/24/2017 1051   PLT 245 01/05/2017 1017   MCV 83.1 06/24/2017 1051   MCV 85.9 01/05/2017 1017   MCH 28.1 06/24/2017 1051   MCHC 33.8 06/24/2017 1051   RDW 16.5 (H) 06/24/2017 1051   RDW  15.3 (H) 01/05/2017 1017   LYMPHSABS 1.5 06/24/2017 1051   LYMPHSABS 3.2 01/05/2017 1017   MONOABS 1.4 (H) 06/24/2017 1051   MONOABS 0.8 01/05/2017 1017   EOSABS 0.0 06/24/2017 1051   EOSABS 0.4 01/05/2017 1017   BASOSABS 0.1 06/24/2017 1051   BASOSABS 0.0 01/05/2017 1017    BMET    Component Value Date/Time   NA 135 (L) 06/24/2017 1051   NA 144 01/05/2017 1017   K 4.0 06/24/2017 1051   K 3.8 01/05/2017 1017   CL 104 06/24/2017 1051   CO2 25 06/24/2017 1051   CO2 23 01/05/2017 1017   GLUCOSE 193 (H) 06/24/2017 1051   GLUCOSE 116 01/05/2017 1017   BUN 12 06/24/2017 1051   BUN 21.5 01/05/2017 1017   CREATININE 1.27 (H) 06/24/2017 1051   CREATININE 1.2 (H) 01/05/2017 1017   CALCIUM 9.3 06/24/2017 1051   CALCIUM 9.6 01/05/2017 1017   GFRNONAA 38 (L) 06/24/2017 1051   GFRAA 45 (L) 06/24/2017 1051    BNP No results found for: BNP  ProBNP No results found for: PROBNP  Imaging: Dg Chest 2 View  Result Date: 06/09/2017 CLINICAL DATA:  Lymphoma.  Preoperative. EXAM: CHEST - 2 VIEW COMPARISON:  05/11/2017 PET-CT.  01/07/2017 chest radiograph FINDINGS: Stable cardiomediastinal silhouette with normal heart size. No pneumothorax. Stable left costophrenic angle pleural-parenchymal scarring. No pleural effusions. No pulmonary edema. Asymmetric prominence of the right hilum, increased since 01/07/2017. Hazy patchy right midlung opacity appears chronic. No acute consolidative airspace disease. IMPRESSION: 1. Asymmetric prominence of the right hilum correlating with known hypermetabolic right hilar adenopathy as seen on recent PET-CT study. 2. No acute cardiopulmonary disease. Stable chronic hazy right mid lung opacity suggesting scarring. Stable left costophrenic angle pleural-parenchymal scarring. Electronically Signed   By: Ilona Sorrel M.D.   On: 06/09/2017 08:02   Dg Fluoro Guide Cv Line-no Report  Result Date: 06/10/2017 Fluoroscopy was utilized by the requesting physician.  No  radiographic interpretation.     Assessment & Plan:   Primary cancer of left upper lobe of lung ([place]) Continue close follow-up with oncology  COPD GOLD I  Trelegy sample provided today  >>>refill sent in to Pharmacy  Ventolin inhaler refilled  Continue oxygen  use purchase SPO2 monitor from pharmacy to check saturations when on outings as well as at home >>> Maintain oxygen saturations greater than 90% Follow-up with Dr. Elsworth Soho in 3 months [facility] Follow Up  >>> Order for new oxygen tubing >>> Patient to get contact information for [facility] and put on refrigerator for close follow-up  Chronic respiratory failure with hypoxia [facility]) Trelegy sample provided today  >>>refill sent in to Pharmacy  Ventolin inhaler refilled  Continue oxygen use Daughter to purchase SPO2 monitor from pharmacy to check saturations when on outings as well as at home >>> Maintain oxygen saturations greater than 90% Follow-up with Dr. Elsworth Soho in 3 months [facility] Follow Up  >>> Order for new oxygen tubing >>> Patient to get contact information for [facility] and put on refrigerator for close follow-up  Allergic rhinitis Restart Singulair today  Saline rinses as needed Can do fluticasone nasal spray 1 spray in each nostril daily as needed   This appointment was 40 minutes long.  With over 50% of that time being spent with direct patient care: Education, reviewing inhalers, medication education, plan of care discussion, and assessment.  Patient stated they had no more questions and understood current plan of care.  Emphasized importance of follow-up if unable to pay or obtain medications.  Reviewed and reemphasized how to use inhalers correctly.   Lauraine Rinne, NP 06/26/2017

## 2017-06-26 NOTE — Assessment & Plan Note (Signed)
Restart Singulair today  Saline rinses as needed Can do fluticasone nasal spray 1 spray in each nostril daily as needed

## 2017-06-26 NOTE — Assessment & Plan Note (Signed)
Trelegy sample provided today  >>>refill sent in to Pharmacy  Ventolin inhaler refilled  Continue oxygen use purchase SPO2 monitor from pharmacy to check saturations when on outings as well as at home >>> Maintain oxygen saturations greater than 90% Follow-up with Dr. Elsworth Soho in 3 months [facility] Follow Up  >>> Order for new oxygen tubing >>> Patient to get contact information for [facility] and put on refrigerator for close follow-up

## 2017-06-26 NOTE — Assessment & Plan Note (Signed)
Trelegy sample provided today  >>>refill sent in to Pharmacy  Ventolin inhaler refilled  Continue oxygen use Daughter to purchase SPO2 monitor from pharmacy to check saturations when on outings as well as at home >>> Maintain oxygen saturations greater than 90% Follow-up with Dr. Elsworth Soho in 3 months [facility] Follow Up  >>> Order for new oxygen tubing >>> Patient to get contact information for [facility] and put on refrigerator for close follow-up

## 2017-06-26 NOTE — Assessment & Plan Note (Signed)
Continue close follow-up with oncology

## 2017-06-26 NOTE — Addendum Note (Signed)
Addended by: Amado Coe on: 06/26/2017 11:36 AM   Modules accepted: Orders

## 2017-06-26 NOTE — Patient Instructions (Addendum)
Continue close follow-up with oncology Trelegy sample provided today  >>>refill sent in to Pharmacy  Ventolin inhaler refilled  Continue oxygen use Daughter to purchase SPO2 monitor from pharmacy to check saturations when on outings as well as at home >>> Maintain oxygen saturations greater than 90% Follow-up with Dr. Elsworth Soho in 3 months Restart Singulair today    [facility] Follow Up  >>> Order for new oxygen tubing >>> Patient to get contact information for [facility] and put on refrigerator for close follow-up  Please contact the office if your symptoms worsen or you have concerns that you are not improving.   Thank you for choosing [facility] for your healthcare, and for allowing [name] to partner with you on your healthcare journey. I am thankful to be able to provide care to you today.   Wyn Quaker FNP-C

## 2017-06-30 NOTE — Progress Notes (Signed)
Reviewed & agree with plan  

## 2017-07-01 ENCOUNTER — Inpatient Hospital Stay: Payer: Medicare HMO

## 2017-07-01 ENCOUNTER — Other Ambulatory Visit: Payer: Self-pay | Admitting: Internal Medicine

## 2017-07-01 ENCOUNTER — Telehealth: Payer: Self-pay | Admitting: Internal Medicine

## 2017-07-01 ENCOUNTER — Inpatient Hospital Stay (HOSPITAL_BASED_OUTPATIENT_CLINIC_OR_DEPARTMENT_OTHER): Payer: Medicare HMO | Admitting: Internal Medicine

## 2017-07-01 VITALS — BP 150/74 | HR 91 | Temp 98.3°F | Resp 17 | Ht 61.0 in | Wt 199.9 lb

## 2017-07-01 VITALS — BP 103/67 | HR 85 | Temp 99.1°F | Resp 14

## 2017-07-01 DIAGNOSIS — C8332 Diffuse large B-cell lymphoma, intrathoracic lymph nodes: Secondary | ICD-10-CM

## 2017-07-01 DIAGNOSIS — R300 Dysuria: Secondary | ICD-10-CM | POA: Diagnosis not present

## 2017-07-01 DIAGNOSIS — N390 Urinary tract infection, site not specified: Secondary | ICD-10-CM | POA: Insufficient documentation

## 2017-07-01 DIAGNOSIS — R350 Frequency of micturition: Secondary | ICD-10-CM | POA: Diagnosis not present

## 2017-07-01 DIAGNOSIS — R0602 Shortness of breath: Secondary | ICD-10-CM | POA: Diagnosis not present

## 2017-07-01 DIAGNOSIS — N3000 Acute cystitis without hematuria: Secondary | ICD-10-CM

## 2017-07-01 DIAGNOSIS — C3412 Malignant neoplasm of upper lobe, left bronchus or lung: Secondary | ICD-10-CM

## 2017-07-01 DIAGNOSIS — Z5111 Encounter for antineoplastic chemotherapy: Secondary | ICD-10-CM

## 2017-07-01 DIAGNOSIS — Z95828 Presence of other vascular implants and grafts: Secondary | ICD-10-CM

## 2017-07-01 LAB — CBC WITH DIFFERENTIAL (CANCER CENTER ONLY)
BASOS PCT: 0 %
Basophils Absolute: 0.1 10*3/uL (ref 0.0–0.1)
EOS ABS: 0.1 10*3/uL (ref 0.0–0.5)
EOS PCT: 0 %
HCT: 32.2 % — ABNORMAL LOW (ref 34.8–46.6)
Hemoglobin: 10.7 g/dL — ABNORMAL LOW (ref 11.6–15.9)
LYMPHS ABS: 2.1 10*3/uL (ref 0.9–3.3)
Lymphocytes Relative: 14 %
MCH: 27.8 pg (ref 25.1–34.0)
MCHC: 33.2 g/dL (ref 31.5–36.0)
MCV: 83.6 fL (ref 79.5–101.0)
Monocytes Absolute: 1.7 10*3/uL — ABNORMAL HIGH (ref 0.1–0.9)
Monocytes Relative: 11 %
NRBC: 1 /100{WBCs} — AB
Neutro Abs: 11.2 10*3/uL — ABNORMAL HIGH (ref 1.5–6.5)
Neutrophils Relative %: 75 %
PLATELETS: 600 10*3/uL — AB (ref 145–400)
RBC: 3.85 MIL/uL (ref 3.70–5.45)
RDW: 17.2 % — AB (ref 11.2–14.5)
WBC: 15.1 10*3/uL — AB (ref 3.9–10.3)

## 2017-07-01 LAB — URINALYSIS, COMPLETE (UACMP) WITH MICROSCOPIC
BILIRUBIN URINE: NEGATIVE
GLUCOSE, UA: NEGATIVE mg/dL
Ketones, ur: NEGATIVE mg/dL
NITRITE: NEGATIVE
Protein, ur: 30 mg/dL — AB
Specific Gravity, Urine: 1.011 (ref 1.005–1.030)
pH: 5 (ref 5.0–8.0)

## 2017-07-01 LAB — CMP (CANCER CENTER ONLY)
ALK PHOS: 114 U/L (ref 40–150)
ALT: 14 U/L (ref 0–55)
AST: 15 U/L (ref 5–34)
Albumin: 2.9 g/dL — ABNORMAL LOW (ref 3.5–5.0)
Anion gap: 11 (ref 3–11)
BUN: 18 mg/dL (ref 7–26)
CALCIUM: 9.6 mg/dL (ref 8.4–10.4)
CO2: 20 mmol/L — ABNORMAL LOW (ref 22–29)
CREATININE: 1.1 mg/dL (ref 0.60–1.10)
Chloride: 108 mmol/L (ref 98–109)
GFR, EST AFRICAN AMERICAN: 53 mL/min — AB (ref >60)
GFR, EST NON AFRICAN AMERICAN: 46 mL/min — AB (ref >60)
Glucose, Bld: 161 mg/dL — ABNORMAL HIGH (ref 70–140)
Potassium: 4.2 mmol/L (ref 3.5–5.1)
Sodium: 139 mmol/L (ref 136–145)
Total Bilirubin: 0.2 mg/dL (ref 0.2–1.2)
Total Protein: 7.3 g/dL (ref 6.4–8.3)

## 2017-07-01 MED ORDER — SODIUM CHLORIDE 0.9 % IV SOLN
Freq: Once | INTRAVENOUS | Status: AC
  Administered 2017-07-01: 10:00:00 via INTRAVENOUS

## 2017-07-01 MED ORDER — ACETAMINOPHEN 325 MG PO TABS
ORAL_TABLET | ORAL | Status: AC
  Filled 2017-07-01: qty 2

## 2017-07-01 MED ORDER — VINCRISTINE SULFATE CHEMO INJECTION 1 MG/ML
2.0000 mg | Freq: Once | INTRAVENOUS | Status: AC
  Administered 2017-07-01: 2 mg via INTRAVENOUS
  Filled 2017-07-01: qty 2

## 2017-07-01 MED ORDER — PALONOSETRON HCL INJECTION 0.25 MG/5ML
0.2500 mg | Freq: Once | INTRAVENOUS | Status: AC
  Administered 2017-07-01: 0.25 mg via INTRAVENOUS

## 2017-07-01 MED ORDER — DOXORUBICIN HCL CHEMO IV INJECTION 2 MG/ML
50.0000 mg/m2 | Freq: Once | INTRAVENOUS | Status: AC
  Administered 2017-07-01: 100 mg via INTRAVENOUS
  Filled 2017-07-01: qty 50

## 2017-07-01 MED ORDER — CIPROFLOXACIN HCL 500 MG PO TABS: 500.0000 mg | ORAL_TABLET | Freq: Two times a day (BID) | ORAL | 0 refills | Status: AC

## 2017-07-01 MED ORDER — HEPARIN SOD (PORK) LOCK FLUSH 100 UNIT/ML IV SOLN
500.0000 [IU] | Freq: Once | INTRAVENOUS | Status: AC | PRN
Start: 2017-07-01 — End: 2017-07-01
  Administered 2017-07-01: 500 [IU]
  Filled 2017-07-01: qty 5

## 2017-07-01 MED ORDER — SODIUM CHLORIDE 0.9 % IV SOLN
750.0000 mg/m2 | Freq: Once | INTRAVENOUS | Status: AC
  Administered 2017-07-01: 1500 mg via INTRAVENOUS
  Filled 2017-07-01: qty 75

## 2017-07-01 MED ORDER — SODIUM CHLORIDE 0.9 % IV SOLN
375.0000 mg/m2 | Freq: Once | INTRAVENOUS | Status: AC
  Administered 2017-07-01: 800 mg via INTRAVENOUS
  Filled 2017-07-01: qty 50

## 2017-07-01 MED ORDER — ACETAMINOPHEN 325 MG PO TABS
650.0000 mg | ORAL_TABLET | Freq: Once | ORAL | Status: AC
  Administered 2017-07-01: 650 mg via ORAL

## 2017-07-01 MED ORDER — PALONOSETRON HCL INJECTION 0.25 MG/5ML
INTRAVENOUS | Status: AC
  Filled 2017-07-01: qty 5

## 2017-07-01 MED ORDER — DEXAMETHASONE SODIUM PHOSPHATE 10 MG/ML IJ SOLN
INTRAMUSCULAR | Status: AC
  Filled 2017-07-01: qty 1

## 2017-07-01 MED ORDER — SODIUM CHLORIDE 0.9% FLUSH
10.0000 mL | Freq: Once | INTRAVENOUS | Status: AC
  Administered 2017-07-01: 10 mL
  Filled 2017-07-01: qty 10

## 2017-07-01 MED ORDER — DIPHENHYDRAMINE HCL 25 MG PO CAPS
ORAL_CAPSULE | ORAL | Status: AC
Start: 2017-07-01 — End: ?
  Filled 2017-07-01: qty 2

## 2017-07-01 MED ORDER — DEXAMETHASONE SODIUM PHOSPHATE 10 MG/ML IJ SOLN
10.0000 mg | Freq: Once | INTRAMUSCULAR | Status: AC
  Administered 2017-07-01: 10 mg via INTRAVENOUS

## 2017-07-01 MED ORDER — SODIUM CHLORIDE 0.9% FLUSH
10.0000 mL | INTRAVENOUS | Status: DC | PRN
  Administered 2017-07-01: 10 mL
  Filled 2017-07-01: qty 10

## 2017-07-01 MED ORDER — SODIUM CHLORIDE 0.9 % IV SOLN: 375.0000 mg/m2 | Freq: Once | INTRAVENOUS | Status: DC

## 2017-07-01 MED ORDER — DIPHENHYDRAMINE HCL 25 MG PO CAPS
50.0000 mg | ORAL_CAPSULE | Freq: Once | ORAL | Status: AC
  Administered 2017-07-01: 50 mg via ORAL

## 2017-07-01 NOTE — Telephone Encounter (Signed)
Scheduled appt per 5/22 los - pt to get an updated schedule next visit.

## 2017-07-01 NOTE — Progress Notes (Signed)
[name] [name] [phone]   [phone]  OFFICE PROGRESS NOTE  Nolene Ebbs, MD [place] [address]   DIAGNOSIS: 1)Stage IIA (T2b, N0, M0)non-small cell lung cancer,moderately to poorly differentiated squamous cell carcinoma diagnosed in April 2016. 2) Diffuse large B-cell lymphoma diagnosed in April 2019.  PRIOR THERAPY: 1) Status post left upper lobectomy with lymph node dissection under the care of [name] on 05/04/2014. 2) Adjuvant chemotherapy with carboplatin for an AUC of 5 and paclitaxel 175 mg/m given every 3 weeks with Neulasta support. Status post 4 cycles. Last dose was given 08/26/2014.  CURRENT THERAPY: Systemic chemotherapy with CHOP/Rituxan (Rituxan 375 mg meter squared, Cytoxan 750 mg meter squared, Adriamycin 50 mg/m, vincristine 2 mg, and prednisone 100 mg days 1 through 5 of each cycle of treatment). Chemotherapy will be given every 3 weeks. First dose 06/12/2017.  Status post 1 cycle.  INTERVAL HISTORY: [name] [name] 84 y.o. female returns to the clinic today for follow-up visit accompanied by her daughter and son.  The patient is feeling fine today with no specific complaints except for increased urine frequency and dysuria.  She has no hematuria.  This is started a few days ago.  She denied having any fever or chills.  She has no nausea, vomiting, diarrhea or constipation.  She denied having any chest pain but continues to have shortness of breath at baseline increased with exertion and she is currently on home oxygen.  The patient denied having any recent weight loss or night sweats.  She tolerated the first cycle of her treatment with a CHOP/Rituxan fairly well.  She is here today for evaluation before starting cycle #2.  MEDICAL HISTORY: Past Medical History:  Diagnosis Date  . Allergic rhinitis   . Anxiety   . Arthritis   . Cancer ([facility])   . COPD (chronic obstructive pulmonary disease)  ([facility])   . Diabetes mellitus without complication ([facility])    no meds     . Dyspnea   . Emphysema of lung ([facility])   . GERD (gastroesophageal reflux disease)   . History of positive PPD, untreated   . HTN (hypertension)   . Palpitations   . Primary cancer of left upper lobe of lung ([facility]) 05/23/2014   Stage IIA- T2b,N0- thoracoscopic left upper lobectomy 05/04/14  . Requires supplemental oxygen    3 liters  prn  . SOB (shortness of breath)     ALLERGIES:  is allergic to lipitor [atorvastatin] and darvon [propoxyphene hcl].  MEDICATIONS:  Current Outpatient Medications  Medication Sig Dispense Refill  . allopurinol (ZYLOPRIM) 100 MG tablet Take 1 tablet (100 mg total) by mouth 2 (two) times daily. 60 tablet 1  . ALPRAZolam (XANAX) 0.5 MG tablet Take 0.5 mg by mouth 3 (three) times daily as needed for anxiety. Reported on 03/28/2015    . esomeprazole (NEXIUM) 40 MG capsule Take 40 mg by mouth daily as needed.     . Fluticasone-Umeclidin-Vilant (TRELEGY ELLIPTA) 100-62.5-25 MCG/INH AEPB Inhale 1 puff into the lungs daily. 1 each 6  . Fluticasone-Umeclidin-Vilant (TRELEGY ELLIPTA) 100-62.5-25 MCG/INH AEPB Inhale 1 puff into the lungs daily. 1 each 0  . lidocaine-prilocaine (EMLA) cream Apply 1 application topically as needed. 30 g 0  . loratadine (CLARITIN) 10 MG tablet Take 10 mg by mouth daily.    Marland Kitchen losartan (COZAAR) 50 MG tablet Take 100 mg by mouth daily.    . metoprolol tartrate (LOPRESSOR) 25 MG tablet Take  25 mg by mouth 2 (two) times daily.    . montelukast (SINGULAIR) 10 MG tablet Take 1 tablet (10 mg total) by mouth at bedtime. 30 tablet 11  . multivitamin-iron-minerals-folic acid (CENTRUM) chewable tablet Chew 1 tablet by mouth daily.    Marland Kitchen oxyCODONE (OXY IR/ROXICODONE) 5 MG immediate release tablet Take 1 tablet (5 mg total) by mouth every 6 (six) hours as needed for moderate pain or severe pain. 30 tablet 0  . potassium chloride (K-DUR,KLOR-CON) 10 MEQ tablet Take 1 tablet by mouth  daily.    . predniSONE (DELTASONE) 50 MG tablet Take 2 tabs (100 mg) daily X5 days with each cycle of chemo. 30 tablet 1  . prochlorperazine (COMPAZINE) 10 MG tablet Take 1 tablet (10 mg total) by mouth every 6 (six) hours as needed for nausea or vomiting. 30 tablet 0  . tiZANidine (ZANAFLEX) 2 MG tablet Take 1 tablet by mouth daily as needed.    . traMADol (ULTRAM) 50 MG tablet Take 50 mg by mouth 2 (two) times daily as needed.    . VENTOLIN HFA 108 (90 Base) MCG/ACT inhaler Inhale 1-2 puffs into the lungs every 4 (four) hours as needed for shortness of breath. 1 Inhaler 5   No current facility-administered medications for this visit.     SURGICAL HISTORY:  Past Surgical History:  Procedure Laterality Date  . CATARACT EXTRACTION W/ INTRAOCULAR LENS  IMPLANT, BILATERAL    . LEAD REMOVAL Left 05/04/2014   Procedure: CRYO INTERCOSTAL NERVE BLOCK;  Surgeon: [name], MD;  Location: [facility];  Service: Thoracic;  Laterality: Left;  . LUNG LOBECTOMY  05/04/14   left upper lobectomy  . NODE DISSECTION Left 05/04/2014   Procedure: NODE DISSECTION;  Surgeon: [name], MD;  Location: [facility];  Service: Thoracic;  Laterality: Left;  . PORTACATH PLACEMENT Left 06/10/2017   Procedure: INSERTION PORT-A-CATH;  Surgeon: [name], MD;  Location: [facility];  Service: Thoracic;  Laterality: Left;  Marland Kitchen VAGINAL HYSTERECTOMY  1966  . VIDEO ASSISTED THORACOSCOPY (VATS)/ LOBECTOMY Left 05/04/2014   Procedure: LEFT VIDEO ASSISTED THORACOSCOPY (VATS)/LEFT UPPER LOBECTOMY;  Surgeon: [name], MD;  Location: [facility];  Service: Thoracic;  Laterality: Left;    REVIEW OF SYSTEMS:  Constitutional: negative Eyes: negative Ears, nose, mouth, throat, and face: negative Respiratory: positive for dyspnea on exertion Cardiovascular: negative Gastrointestinal: negative Genitourinary:positive for dysuria and frequency Integument/breast: negative Hematologic/lymphatic:  negative Musculoskeletal:negative Neurological: negative Behavioral/Psych: negative Endocrine: negative Allergic/Immunologic: negative   PHYSICAL EXAMINATION: General appearance: alert, cooperative, fatigued and no distress Head: Normocephalic, without obvious abnormality, atraumatic Neck: no adenopathy, no JVD, supple, symmetrical, trachea midline and thyroid not enlarged, symmetric, no tenderness/mass/nodules Lymph nodes: Cervical, supraclavicular, and axillary nodes normal. Resp: clear to auscultation bilaterally Back: symmetric, no curvature. ROM normal. No CVA tenderness. Cardio: regular rate and rhythm, S1, S2 normal, no murmur, click, rub or gallop GI: soft, non-tender; bowel sounds normal; no masses,  no organomegaly Extremities: extremities normal, atraumatic, no cyanosis or edema Neurologic: Alert and oriented X 3, normal strength and tone. Normal symmetric reflexes. Normal coordination and gait  ECOG PERFORMANCE STATUS: 1 - Symptomatic but completely ambulatory  Blood pressure (!) 150/74, pulse 91, temperature 98.3 F (36.8 C), temperature source Oral, resp. rate 17, height 5\' 1"  (1.549 m), weight 199 lb 14.4 oz (90.7 kg), SpO2 95 %.  LABORATORY DATA: Lab Results  Component Value Date   WBC 18.1 (H) 06/24/2017   HGB 10.0 (L) 06/24/2017  HCT 29.6 (L) 06/24/2017   MCV 83.1 06/24/2017   PLT 181 06/24/2017      Chemistry      Component Value Date/Time   NA 135 (L) 06/24/2017 1051   NA 144 01/05/2017 1017   K 4.0 06/24/2017 1051   K 3.8 01/05/2017 1017   CL 104 06/24/2017 1051   CO2 25 06/24/2017 1051   CO2 23 01/05/2017 1017   BUN 12 06/24/2017 1051   BUN 21.5 01/05/2017 1017   CREATININE 1.27 (H) 06/24/2017 1051   CREATININE 1.2 (H) 01/05/2017 1017      Component Value Date/Time   CALCIUM 9.3 06/24/2017 1051   CALCIUM 9.6 01/05/2017 1017   ALKPHOS 107 06/24/2017 1051   ALKPHOS 104 01/05/2017 1017   AST 19 06/24/2017 1051   AST 15 01/05/2017 1017   ALT  16 06/24/2017 1051   ALT 9 01/05/2017 1017   BILITOT 0.2 06/24/2017 1051   BILITOT 0.37 01/05/2017 1017       RADIOGRAPHIC STUDIES: Dg Chest 2 View  Result Date: 06/09/2017 CLINICAL DATA:  Lymphoma.  Preoperative. EXAM: CHEST - 2 VIEW COMPARISON:  05/11/2017 PET-CT.  01/07/2017 chest radiograph FINDINGS: Stable cardiomediastinal silhouette with normal heart size. No pneumothorax. Stable left costophrenic angle pleural-parenchymal scarring. No pleural effusions. No pulmonary edema. Asymmetric prominence of the right hilum, increased since 01/07/2017. Hazy patchy right midlung opacity appears chronic. No acute consolidative airspace disease. IMPRESSION: 1. Asymmetric prominence of the right hilum correlating with known hypermetabolic right hilar adenopathy as seen on recent PET-CT study. 2. No acute cardiopulmonary disease. Stable chronic hazy right mid lung opacity suggesting scarring. Stable left costophrenic angle pleural-parenchymal scarring. Electronically Signed   By: Ilona Sorrel M.D.   On: 06/09/2017 08:02   Dg Fluoro Guide Cv Line-no Report  Result Date: 06/10/2017 Fluoroscopy was utilized by the requesting physician.  No radiographic interpretation.    ASSESSMENT AND PLAN: This is a very pleasant 84 years old African-American female with history of stage IIa non-small cell lung cancer status post left upper lobectomy with lymph node dissection followed by adjuvant chemotherapy. The patient recently diagnosed with large B cell non-Hodgkin lymphoma. She is currently undergoing systemic chemotherapy with a CHOP/Rituxan status post 1 cycle.  She tolerated the first cycle of her treatment well with no concerning complaints except for fatigue. I recommended for her to proceed with cycle #2 today as scheduled. For the dysuria and increased frequency, I will check urinalysis today and if there is evidence for urinary tract infection, I will start the patient on antibiotics. She will come back  for follow-up visit in 3 weeks for evaluation before starting cycle #3. For COPD, she will continue her current inhaler as prescribed by her pulmonologist. The patient was advised to call immediately if she has any concerning symptoms in the interval. The patient voices understanding of current disease status and treatment options and is in agreement with the current care plan.  All questions were answered. The patient knows to call the clinic with any problems, questions or concerns. We can certainly see the patient much sooner if necessary.  I spent 15 minutes counseling the patient face to face. The total time spent in the appointment was 25 minutes.  Disclaimer: This note was dictated with voice recognition software. Similar sounding words can inadvertently be transcribed and may not be corrected upon review.

## 2017-07-01 NOTE — Patient Instructions (Signed)
[facility] Discharge Instructions for Patients Receiving Chemotherapy  Today you received the following chemotherapy agents:  Doxorubicin, Vincristine, Cytoxan, Rituxan  To help prevent nausea and vomiting after your treatment, we encourage you to take your nausea medication as prescribed.   If you develop nausea and vomiting that is not controlled by your nausea medication, call the clinic.   BELOW ARE SYMPTOMS THAT SHOULD BE REPORTED IMMEDIATELY:  *FEVER GREATER THAN 100.5 F  *CHILLS WITH OR WITHOUT FEVER  NAUSEA AND VOMITING THAT IS NOT CONTROLLED WITH YOUR NAUSEA MEDICATION  *UNUSUAL SHORTNESS OF BREATH  *UNUSUAL BRUISING OR BLEEDING  TENDERNESS IN MOUTH AND THROAT WITH OR WITHOUT PRESENCE OF ULCERS  *URINARY PROBLEMS  *BOWEL PROBLEMS  UNUSUAL RASH Items with * indicate a potential emergency and should be followed up as soon as possible.  Feel free to call the clinic should you have any questions or concerns. The clinic phone number is [phone].  Please show the [facility] at check-in to the Emergency Department and triage nurse.

## 2017-07-03 ENCOUNTER — Inpatient Hospital Stay: Payer: Medicare HMO

## 2017-07-03 DIAGNOSIS — Z5111 Encounter for antineoplastic chemotherapy: Secondary | ICD-10-CM | POA: Diagnosis not present

## 2017-07-03 DIAGNOSIS — C8332 Diffuse large B-cell lymphoma, intrathoracic lymph nodes: Secondary | ICD-10-CM

## 2017-07-03 MED ORDER — PEGFILGRASTIM-CBQV 6 MG/0.6ML ~~LOC~~ SOSY
6.0000 mg | PREFILLED_SYRINGE | Freq: Once | SUBCUTANEOUS | Status: AC
Start: 2017-07-03 — End: 2017-07-03
  Administered 2017-07-03: 6 mg via SUBCUTANEOUS

## 2017-07-03 MED ORDER — PEGFILGRASTIM-CBQV 6 MG/0.6ML ~~LOC~~ SOSY
PREFILLED_SYRINGE | SUBCUTANEOUS | Status: AC
  Filled 2017-07-03: qty 0.6

## 2017-07-07 ENCOUNTER — Other Ambulatory Visit: Payer: Self-pay | Admitting: Oncology

## 2017-07-08 ENCOUNTER — Telehealth: Payer: Self-pay

## 2017-07-08 ENCOUNTER — Inpatient Hospital Stay: Payer: Medicare HMO

## 2017-07-08 DIAGNOSIS — Z5111 Encounter for antineoplastic chemotherapy: Secondary | ICD-10-CM | POA: Diagnosis not present

## 2017-07-08 DIAGNOSIS — C8332 Diffuse large B-cell lymphoma, intrathoracic lymph nodes: Secondary | ICD-10-CM

## 2017-07-08 DIAGNOSIS — Z95828 Presence of other vascular implants and grafts: Secondary | ICD-10-CM

## 2017-07-08 LAB — CBC WITH DIFFERENTIAL (CANCER CENTER ONLY)
Basophils Absolute: 0 10*3/uL (ref 0.0–0.1)
Basophils Relative: 1 %
EOS ABS: 0 10*3/uL (ref 0.0–0.5)
Eosinophils Relative: 2 %
HEMATOCRIT: 29.7 % — AB (ref 34.8–46.6)
HEMOGLOBIN: 9.8 g/dL — AB (ref 11.6–15.9)
LYMPHS ABS: 0.4 10*3/uL — AB (ref 0.9–3.3)
LYMPHS PCT: 40 %
MCH: 27.5 pg (ref 25.1–34.0)
MCHC: 33 g/dL (ref 31.5–36.0)
MCV: 83.2 fL (ref 79.5–101.0)
Monocytes Absolute: 0.1 10*3/uL (ref 0.1–0.9)
Monocytes Relative: 6 %
NEUTROS ABS: 0.5 10*3/uL — AB (ref 1.5–6.5)
NEUTROS PCT: 51 %
Platelet Count: 178 10*3/uL (ref 145–400)
RBC: 3.57 MIL/uL — AB (ref 3.70–5.45)
RDW: 16.4 % — ABNORMAL HIGH (ref 11.2–14.5)
WBC: 1 10*3/uL — AB (ref 3.9–10.3)

## 2017-07-08 LAB — CMP (CANCER CENTER ONLY)
ALT: 16 U/L (ref 0–55)
ANION GAP: 9 (ref 3–11)
AST: 15 U/L (ref 5–34)
Albumin: 2.9 g/dL — ABNORMAL LOW (ref 3.5–5.0)
Alkaline Phosphatase: 94 U/L (ref 40–150)
BUN: 22 mg/dL (ref 7–26)
CHLORIDE: 105 mmol/L (ref 98–109)
CO2: 24 mmol/L (ref 22–29)
CREATININE: 0.94 mg/dL (ref 0.60–1.10)
Calcium: 9.4 mg/dL (ref 8.4–10.4)
GFR, EST NON AFRICAN AMERICAN: 55 mL/min — AB (ref >60)
Glucose, Bld: 195 mg/dL — ABNORMAL HIGH (ref 70–140)
POTASSIUM: 4.1 mmol/L (ref 3.5–5.1)
Sodium: 138 mmol/L (ref 136–145)
Total Bilirubin: 0.3 mg/dL (ref 0.2–1.2)
Total Protein: 6.2 g/dL — ABNORMAL LOW (ref 6.4–8.3)

## 2017-07-08 MED ORDER — SODIUM CHLORIDE 0.9% FLUSH
10.0000 mL | Freq: Once | INTRAVENOUS | Status: AC
  Administered 2017-07-08: 10 mL
  Filled 2017-07-08: qty 10

## 2017-07-08 MED ORDER — HEPARIN SOD (PORK) LOCK FLUSH 100 UNIT/ML IV SOLN
500.0000 [IU] | Freq: Once | INTRAVENOUS | Status: AC
  Administered 2017-07-08: 500 [IU]
  Filled 2017-07-08: qty 5

## 2017-07-08 NOTE — Telephone Encounter (Signed)
Spoke to patient per Mikey Bussing, NP and reviewed neutropenic precautions due to her low ANC. Pt verbalizes understanding and agrees with plan of care. Pt states that she will call [facility] with any fevers.

## 2017-07-08 NOTE — Telephone Encounter (Signed)
-----   Message from Maryanna Shape, NP sent at 07/08/2017  3:45 PM EDT ----- Call patient.  Let her know that her [place] is low at 0.5.  Review neutropenic precautions with the patient.  The patient already received Neulasta so I anticipate that this will begin to increase soon.  Thanks

## 2017-07-15 ENCOUNTER — Inpatient Hospital Stay: Payer: Medicare HMO

## 2017-07-15 ENCOUNTER — Inpatient Hospital Stay: Payer: Medicare HMO | Attending: Internal Medicine

## 2017-07-15 VITALS — BP 133/69 | HR 107 | Temp 98.5°F | Resp 20 | Ht 61.0 in | Wt 199.7 lb

## 2017-07-15 DIAGNOSIS — R5383 Other fatigue: Secondary | ICD-10-CM | POA: Diagnosis not present

## 2017-07-15 DIAGNOSIS — M1990 Unspecified osteoarthritis, unspecified site: Secondary | ICD-10-CM | POA: Diagnosis not present

## 2017-07-15 DIAGNOSIS — R066 Hiccough: Secondary | ICD-10-CM | POA: Diagnosis not present

## 2017-07-15 DIAGNOSIS — Z5111 Encounter for antineoplastic chemotherapy: Secondary | ICD-10-CM | POA: Diagnosis not present

## 2017-07-15 DIAGNOSIS — K219 Gastro-esophageal reflux disease without esophagitis: Secondary | ICD-10-CM | POA: Insufficient documentation

## 2017-07-15 DIAGNOSIS — C3412 Malignant neoplasm of upper lobe, left bronchus or lung: Secondary | ICD-10-CM | POA: Diagnosis not present

## 2017-07-15 DIAGNOSIS — T451X5S Adverse effect of antineoplastic and immunosuppressive drugs, sequela: Secondary | ICD-10-CM | POA: Insufficient documentation

## 2017-07-15 DIAGNOSIS — Z79899 Other long term (current) drug therapy: Secondary | ICD-10-CM | POA: Insufficient documentation

## 2017-07-15 DIAGNOSIS — Z7952 Long term (current) use of systemic steroids: Secondary | ICD-10-CM | POA: Diagnosis not present

## 2017-07-15 DIAGNOSIS — C8332 Diffuse large B-cell lymphoma, intrathoracic lymph nodes: Secondary | ICD-10-CM | POA: Insufficient documentation

## 2017-07-15 DIAGNOSIS — R600 Localized edema: Secondary | ICD-10-CM | POA: Diagnosis not present

## 2017-07-15 DIAGNOSIS — J449 Chronic obstructive pulmonary disease, unspecified: Secondary | ICD-10-CM | POA: Diagnosis not present

## 2017-07-15 DIAGNOSIS — I10 Essential (primary) hypertension: Secondary | ICD-10-CM | POA: Diagnosis not present

## 2017-07-15 DIAGNOSIS — D6481 Anemia due to antineoplastic chemotherapy: Secondary | ICD-10-CM | POA: Insufficient documentation

## 2017-07-15 DIAGNOSIS — Z95828 Presence of other vascular implants and grafts: Secondary | ICD-10-CM

## 2017-07-15 DIAGNOSIS — Z452 Encounter for adjustment and management of vascular access device: Secondary | ICD-10-CM | POA: Diagnosis not present

## 2017-07-15 DIAGNOSIS — Z9981 Dependence on supplemental oxygen: Secondary | ICD-10-CM | POA: Diagnosis not present

## 2017-07-15 DIAGNOSIS — E119 Type 2 diabetes mellitus without complications: Secondary | ICD-10-CM | POA: Diagnosis not present

## 2017-07-15 DIAGNOSIS — Z5112 Encounter for antineoplastic immunotherapy: Secondary | ICD-10-CM | POA: Diagnosis present

## 2017-07-15 LAB — URIC ACID: URIC ACID, SERUM: 4.9 mg/dL (ref 2.6–7.4)

## 2017-07-15 LAB — CBC WITH DIFFERENTIAL (CANCER CENTER ONLY)
Basophils Absolute: 0.1 10*3/uL (ref 0.0–0.1)
Basophils Relative: 0 %
Eosinophils Absolute: 0 10*3/uL (ref 0.0–0.5)
Eosinophils Relative: 0 %
HCT: 27.4 % — ABNORMAL LOW (ref 34.8–46.6)
HEMOGLOBIN: 9.1 g/dL — AB (ref 11.6–15.9)
LYMPHS ABS: 1.4 10*3/uL (ref 0.9–3.3)
Lymphocytes Relative: 7 %
MCH: 27.7 pg (ref 25.1–34.0)
MCHC: 33.2 g/dL (ref 31.5–36.0)
MCV: 83.3 fL (ref 79.5–101.0)
MONOS PCT: 5 %
Monocytes Absolute: 1 10*3/uL — ABNORMAL HIGH (ref 0.1–0.9)
NEUTROS ABS: 16.8 10*3/uL — AB (ref 1.5–6.5)
NEUTROS PCT: 88 %
NRBC: 1 /100{WBCs} — AB
Platelet Count: 113 10*3/uL — ABNORMAL LOW (ref 145–400)
RBC: 3.29 MIL/uL — AB (ref 3.70–5.45)
RDW: 17.2 % — ABNORMAL HIGH (ref 11.2–14.5)
WBC Count: 19.2 10*3/uL — ABNORMAL HIGH (ref 3.9–10.3)

## 2017-07-15 LAB — CMP (CANCER CENTER ONLY)
ALT: 20 U/L (ref 0–55)
ANION GAP: 9 (ref 3–11)
AST: 34 U/L (ref 5–34)
Albumin: 2.6 g/dL — ABNORMAL LOW (ref 3.5–5.0)
Alkaline Phosphatase: 127 U/L (ref 40–150)
BUN: 14 mg/dL (ref 7–26)
CHLORIDE: 103 mmol/L (ref 98–109)
CO2: 23 mmol/L (ref 22–29)
Calcium: 8.8 mg/dL (ref 8.4–10.4)
Creatinine: 1.14 mg/dL — ABNORMAL HIGH (ref 0.60–1.10)
GFR, EST AFRICAN AMERICAN: 51 mL/min — AB (ref >60)
GFR, Estimated: 44 mL/min — ABNORMAL LOW (ref >60)
Glucose, Bld: 210 mg/dL — ABNORMAL HIGH (ref 70–140)
POTASSIUM: 3.8 mmol/L (ref 3.5–5.1)
SODIUM: 135 mmol/L — AB (ref 136–145)
Total Bilirubin: 0.5 mg/dL (ref 0.2–1.2)
Total Protein: 6.4 g/dL (ref 6.4–8.3)

## 2017-07-15 LAB — LACTATE DEHYDROGENASE: LDH: 299 U/L — ABNORMAL HIGH (ref 125–245)

## 2017-07-15 MED ORDER — SODIUM CHLORIDE 0.9% FLUSH
10.0000 mL | Freq: Once | INTRAVENOUS | Status: AC
  Administered 2017-07-15: 10 mL
  Filled 2017-07-15: qty 10

## 2017-07-15 MED ORDER — HEPARIN SOD (PORK) LOCK FLUSH 100 UNIT/ML IV SOLN
500.0000 [IU] | Freq: Once | INTRAVENOUS | Status: AC
  Administered 2017-07-15: 500 [IU]
  Filled 2017-07-15: qty 5

## 2017-07-15 NOTE — Patient Instructions (Signed)
Implanted Port Home Guide An implanted port is a type of central line that is placed under the skin. Central lines are used to provide IV access when treatment or nutrition needs to be given through a person's veins. Implanted ports are used for long-term IV access. An implanted port may be placed because:  You need IV medicine that would be irritating to the small veins in your hands or arms.  You need long-term IV medicines, such as antibiotics.  You need IV nutrition for a long period.  You need frequent blood draws for lab tests.  You need dialysis.  Implanted ports are usually placed in the chest area, but they can also be placed in the upper arm, the abdomen, or the leg. An implanted port has two main parts:  Reservoir. The reservoir is round and will appear as a small, raised area under your skin. The reservoir is the part where a needle is inserted to give medicines or draw blood.  Catheter. The catheter is a thin, flexible tube that extends from the reservoir. The catheter is placed into a large vein. Medicine that is inserted into the reservoir goes into the catheter and then into the vein.  How will I care for my incision site? Do not get the incision site wet. Bathe or shower as directed by your health care provider. How is my port accessed? Special steps must be taken to access the port:  Before the port is accessed, a numbing cream can be placed on the skin. This helps numb the skin over the port site.  Your health care provider uses a sterile technique to access the port. ? Your health care provider must put on a mask and sterile gloves. ? The skin over your port is cleaned carefully with an antiseptic and allowed to dry. ? The port is gently pinched between sterile gloves, and a needle is inserted into the port.  Only "non-coring" port needles should be used to access the port. Once the port is accessed, a blood return should be checked. This helps ensure that the port  is in the vein and is not clogged.  If your port needs to remain accessed for a constant infusion, a clear (transparent) bandage will be placed over the needle site. The bandage and needle will need to be changed every week, or as directed by your health care provider.  Keep the bandage covering the needle clean and dry. Do not get it wet. Follow your health care provider's instructions on how to take a shower or bath while the port is accessed.  If your port does not need to stay accessed, no bandage is needed over the port.  What is flushing? Flushing helps keep the port from getting clogged. Follow your health care provider's instructions on how and when to flush the port. Ports are usually flushed with saline solution or a medicine called heparin. The need for flushing will depend on how the port is used.  If the port is used for intermittent medicines or blood draws, the port will need to be flushed: ? After medicines have been given. ? After blood has been drawn. ? As part of routine maintenance.  If a constant infusion is running, the port may not need to be flushed.  How long will my port stay implanted? The port can stay in for as long as your health care provider thinks it is needed. When it is time for the port to come out, surgery will be   done to remove it. The procedure is similar to the one performed when the port was put in. When should I seek immediate medical care? When you have an implanted port, you should seek immediate medical care if:  You notice a bad smell coming from the incision site.  You have swelling, redness, or drainage at the incision site.  You have more swelling or pain at the port site or the surrounding area.  You have a fever that is not controlled with medicine.  This information is not intended to replace advice given to you by your health care provider. Make sure you discuss any questions you have with your health care provider. Document  Released: 01/27/2005 Document Revised: 07/05/2015 Document Reviewed: 10/04/2012 Elsevier Interactive Patient Education  2017 [employer]  

## 2017-07-22 ENCOUNTER — Inpatient Hospital Stay (HOSPITAL_BASED_OUTPATIENT_CLINIC_OR_DEPARTMENT_OTHER): Payer: Medicare HMO | Admitting: Oncology

## 2017-07-22 ENCOUNTER — Inpatient Hospital Stay: Payer: Medicare HMO

## 2017-07-22 ENCOUNTER — Encounter: Payer: Self-pay | Admitting: Oncology

## 2017-07-22 ENCOUNTER — Telehealth: Payer: Self-pay | Admitting: Oncology

## 2017-07-22 VITALS — BP 95/69 | HR 97 | Temp 98.5°F | Resp 16

## 2017-07-22 VITALS — BP 116/65 | HR 96 | Temp 97.8°F | Resp 17 | Ht 61.0 in | Wt 198.0 lb

## 2017-07-22 DIAGNOSIS — M1990 Unspecified osteoarthritis, unspecified site: Secondary | ICD-10-CM | POA: Diagnosis not present

## 2017-07-22 DIAGNOSIS — R5383 Other fatigue: Secondary | ICD-10-CM

## 2017-07-22 DIAGNOSIS — R600 Localized edema: Secondary | ICD-10-CM | POA: Diagnosis not present

## 2017-07-22 DIAGNOSIS — J449 Chronic obstructive pulmonary disease, unspecified: Secondary | ICD-10-CM | POA: Diagnosis not present

## 2017-07-22 DIAGNOSIS — Z95828 Presence of other vascular implants and grafts: Secondary | ICD-10-CM

## 2017-07-22 DIAGNOSIS — Z5111 Encounter for antineoplastic chemotherapy: Secondary | ICD-10-CM | POA: Diagnosis not present

## 2017-07-22 DIAGNOSIS — C3412 Malignant neoplasm of upper lobe, left bronchus or lung: Secondary | ICD-10-CM

## 2017-07-22 DIAGNOSIS — Z79899 Other long term (current) drug therapy: Secondary | ICD-10-CM

## 2017-07-22 DIAGNOSIS — I10 Essential (primary) hypertension: Secondary | ICD-10-CM | POA: Diagnosis not present

## 2017-07-22 DIAGNOSIS — T451X5S Adverse effect of antineoplastic and immunosuppressive drugs, sequela: Secondary | ICD-10-CM

## 2017-07-22 DIAGNOSIS — C8332 Diffuse large B-cell lymphoma, intrathoracic lymph nodes: Secondary | ICD-10-CM | POA: Diagnosis not present

## 2017-07-22 DIAGNOSIS — R066 Hiccough: Secondary | ICD-10-CM

## 2017-07-22 DIAGNOSIS — T451X5A Adverse effect of antineoplastic and immunosuppressive drugs, initial encounter: Secondary | ICD-10-CM

## 2017-07-22 DIAGNOSIS — Z7952 Long term (current) use of systemic steroids: Secondary | ICD-10-CM

## 2017-07-22 DIAGNOSIS — Z9981 Dependence on supplemental oxygen: Secondary | ICD-10-CM

## 2017-07-22 DIAGNOSIS — E119 Type 2 diabetes mellitus without complications: Secondary | ICD-10-CM | POA: Diagnosis not present

## 2017-07-22 DIAGNOSIS — K219 Gastro-esophageal reflux disease without esophagitis: Secondary | ICD-10-CM | POA: Diagnosis not present

## 2017-07-22 DIAGNOSIS — D6481 Anemia due to antineoplastic chemotherapy: Secondary | ICD-10-CM

## 2017-07-22 LAB — MAGNESIUM: MAGNESIUM: 1.8 mg/dL (ref 1.7–2.4)

## 2017-07-22 LAB — CMP (CANCER CENTER ONLY)
ALBUMIN: 2.7 g/dL — AB (ref 3.5–5.0)
ALK PHOS: 105 U/L (ref 40–150)
ALT: 12 U/L (ref 0–55)
AST: 21 U/L (ref 5–34)
Anion gap: 10 (ref 3–11)
BILIRUBIN TOTAL: 0.3 mg/dL (ref 0.2–1.2)
BUN: 12 mg/dL (ref 7–26)
CALCIUM: 9.2 mg/dL (ref 8.4–10.4)
CO2: 22 mmol/L (ref 22–29)
Chloride: 105 mmol/L (ref 98–109)
Creatinine: 0.96 mg/dL (ref 0.60–1.10)
GFR, Est AFR Am: >60 mL/min (ref >60)
GFR, Estimated: 54 mL/min — ABNORMAL LOW (ref >60)
GLUCOSE: 177 mg/dL — AB (ref 70–140)
Potassium: 4.1 mmol/L (ref 3.5–5.1)
SODIUM: 137 mmol/L (ref 136–145)
TOTAL PROTEIN: 6.7 g/dL (ref 6.4–8.3)

## 2017-07-22 LAB — CBC WITH DIFFERENTIAL (CANCER CENTER ONLY)
Basophils Absolute: 0 10*3/uL (ref 0.0–0.1)
Basophils Relative: 0 %
Eosinophils Absolute: 0.2 10*3/uL (ref 0.0–0.5)
Eosinophils Relative: 1 %
HCT: 27.8 % — ABNORMAL LOW (ref 34.8–46.6)
HEMOGLOBIN: 9.1 g/dL — AB (ref 11.6–15.9)
LYMPHS ABS: 2.6 10*3/uL (ref 0.9–3.3)
Lymphocytes Relative: 21 %
MCH: 27.8 pg (ref 25.1–34.0)
MCHC: 32.7 g/dL (ref 31.5–36.0)
MCV: 84.9 fL (ref 79.5–101.0)
Monocytes Absolute: 0.8 10*3/uL (ref 0.1–0.9)
Monocytes Relative: 6 %
Neutro Abs: 9 10*3/uL — ABNORMAL HIGH (ref 1.5–6.5)
Neutrophils Relative %: 72 %
Platelet Count: 424 10*3/uL — ABNORMAL HIGH (ref 145–400)
RBC: 3.27 MIL/uL — AB (ref 3.70–5.45)
RDW: 17.4 % — ABNORMAL HIGH (ref 11.2–14.5)
WBC Count: 12.6 10*3/uL — ABNORMAL HIGH (ref 3.9–10.3)

## 2017-07-22 MED ORDER — HEPARIN SOD (PORK) LOCK FLUSH 100 UNIT/ML IV SOLN
500.0000 [IU] | Freq: Once | INTRAVENOUS | Status: AC | PRN
  Administered 2017-07-22: 500 [IU]
  Filled 2017-07-22: qty 5

## 2017-07-22 MED ORDER — SODIUM CHLORIDE 0.9% FLUSH
10.0000 mL | Freq: Once | INTRAVENOUS | Status: AC
  Administered 2017-07-22: 10 mL
  Filled 2017-07-22: qty 10

## 2017-07-22 MED ORDER — SODIUM CHLORIDE 0.9 % IV SOLN
375.0000 mg/m2 | Freq: Once | INTRAVENOUS | Status: AC
  Administered 2017-07-22: 800 mg via INTRAVENOUS
  Filled 2017-07-22: qty 50

## 2017-07-22 MED ORDER — DIPHENHYDRAMINE HCL 50 MG/ML IJ SOLN
INTRAMUSCULAR | Status: AC
  Filled 2017-07-22: qty 1

## 2017-07-22 MED ORDER — SODIUM CHLORIDE 0.9 % IV SOLN
750.0000 mg/m2 | Freq: Once | INTRAVENOUS | Status: AC
  Administered 2017-07-22: 1500 mg via INTRAVENOUS
  Filled 2017-07-22: qty 75

## 2017-07-22 MED ORDER — DOXORUBICIN HCL CHEMO IV INJECTION 2 MG/ML
50.0000 mg/m2 | Freq: Once | INTRAVENOUS | Status: AC
  Administered 2017-07-22: 100 mg via INTRAVENOUS
  Filled 2017-07-22: qty 50

## 2017-07-22 MED ORDER — ACETAMINOPHEN 325 MG PO TABS
650.0000 mg | ORAL_TABLET | Freq: Once | ORAL | Status: AC
  Administered 2017-07-22: 650 mg via ORAL

## 2017-07-22 MED ORDER — ACETAMINOPHEN 325 MG PO TABS
ORAL_TABLET | ORAL | Status: AC
Start: 2017-07-22 — End: ?
  Filled 2017-07-22: qty 2

## 2017-07-22 MED ORDER — PALONOSETRON HCL INJECTION 0.25 MG/5ML
0.2500 mg | Freq: Once | INTRAVENOUS | Status: AC
  Administered 2017-07-22: 0.25 mg via INTRAVENOUS

## 2017-07-22 MED ORDER — DEXAMETHASONE SODIUM PHOSPHATE 10 MG/ML IJ SOLN
10.0000 mg | Freq: Once | INTRAMUSCULAR | Status: AC
  Administered 2017-07-22: 10 mg via INTRAVENOUS

## 2017-07-22 MED ORDER — SODIUM CHLORIDE 0.9% FLUSH
10.0000 mL | INTRAVENOUS | Status: DC | PRN
  Administered 2017-07-22: 10 mL
  Filled 2017-07-22: qty 10

## 2017-07-22 MED ORDER — PALONOSETRON HCL INJECTION 0.25 MG/5ML
INTRAVENOUS | Status: AC
  Filled 2017-07-22: qty 5

## 2017-07-22 MED ORDER — DEXAMETHASONE SODIUM PHOSPHATE 10 MG/ML IJ SOLN
INTRAMUSCULAR | Status: AC
  Filled 2017-07-22: qty 1

## 2017-07-22 MED ORDER — BACLOFEN 10 MG PO TABS: 10.0000 mg | ORAL_TABLET | Freq: Three times a day (TID) | ORAL | 1 refills | Status: AC | PRN

## 2017-07-22 MED ORDER — DIPHENHYDRAMINE HCL 50 MG/ML IJ SOLN
50.0000 mg | Freq: Once | INTRAMUSCULAR | Status: AC
  Administered 2017-07-22: 50 mg via INTRAVENOUS

## 2017-07-22 MED ORDER — SODIUM CHLORIDE 0.9 % IV SOLN
Freq: Once | INTRAVENOUS | Status: AC
  Administered 2017-07-22: 11:00:00 via INTRAVENOUS

## 2017-07-22 MED ORDER — VINCRISTINE SULFATE CHEMO INJECTION 1 MG/ML
2.0000 mg | Freq: Once | INTRAVENOUS | Status: AC
  Administered 2017-07-22: 2 mg via INTRAVENOUS
  Filled 2017-07-22: qty 2

## 2017-07-22 MED ORDER — DIPHENHYDRAMINE HCL 25 MG PO CAPS: 50.0000 mg | ORAL_CAPSULE | Freq: Once | ORAL | Status: DC

## 2017-07-22 NOTE — Progress Notes (Signed)
[facility] OFFICE PROGRESS NOTE  Nolene Ebbs, MD [place] [id number]  DIAGNOSIS:  1)Stage IIA (T2b, N0, M0)non-small cell lung cancer,moderately to poorly differentiated squamous cell carcinoma diagnosed in April 2016. 2) Diffuse large B-cell lymphoma diagnosed in April 2019.  PRIOR THERAPY: 1) Status post left upper lobectomy with lymph node dissection under the care of [name] on 05/04/2014. 2) Adjuvant chemotherapy with carboplatin for an AUC of 5 and paclitaxel 175 mg/m given every 3 weeks with Neulasta support. Status post 4 cycles. Last dose was given 08/26/2014.  CURRENT THERAPY: Systemic chemotherapy with CHOP/Rituxan (Rituxan 375 mg meter squared, Cytoxan 750 mg meter squared, Adriamycin 50 mg/m, vincristine 2 mg, and prednisone 100 mg days 1 through 5 of each cycle of treatment). Chemotherapy will be given every 3 weeks. First dose 06/12/2017.Status post 2 cycles.  INTERVAL HISTORY: [name] [name] 84 y.o. female returns for routine follow-up visit accompanied by her daughter.  Patient has noticed that she is having more fatigue while on treatment.  She also is using her oxygen more since she has more shortness of breath with exertion.  She denies having any fevers or chills.  Denies chest pain and hemoptysis.  She has her baseline cough which is unchanged.  She notices lower extremity edema at night.  This improves when she elevates her legs.  She has had intermittent nausea but does not use her antiemetics on a regular basis.  Denies vomiting, constipation, diarrhea.  Reports that she has hiccups which are bothering her and also that her hands and feet are cramping at times.  Denies recent weight loss or night sweats.  The patient is here for evaluation prior to cycle #3 of her chemotherapy.  MEDICAL HISTORY: Past Medical History:  Diagnosis Date  . Allergic rhinitis   . Anxiety   . Arthritis   . Cancer ([facility])   . COPD (chronic  obstructive pulmonary disease) ([facility])   . Diabetes mellitus without complication (HCC)    no meds     . Dyspnea   . Emphysema of lung ([facility])   . GERD (gastroesophageal reflux disease)   . History of positive PPD, untreated   . HTN (hypertension)   . Palpitations   . Primary cancer of left upper lobe of lung ([facility]) 05/23/2014   Stage IIA- T2b,N0- thoracoscopic left upper lobectomy 05/04/14  . Requires supplemental oxygen    3 liters  prn  . SOB (shortness of breath)     ALLERGIES:  is allergic to lipitor [atorvastatin] and darvon [propoxyphene hcl].  MEDICATIONS:  Current Outpatient Medications  Medication Sig Dispense Refill  . allopurinol (ZYLOPRIM) 100 MG tablet TAKE ONE TABLET BY MOUTH TWICE A DAY 60 tablet 1  . ALPRAZolam (XANAX) 0.5 MG tablet Take 0.5 mg by mouth 3 (three) times daily as needed for anxiety. Reported on 03/28/2015    . baclofen (LIORESAL) 10 MG tablet Take 1 tablet (10 mg total) by mouth every 8 (eight) hours as needed for muscle spasms. 30 each 1  . ciprofloxacin (CIPRO) 500 MG tablet Take 1 tablet (500 mg total) by mouth 2 (two) times daily. 10 tablet 0  . esomeprazole (NEXIUM) 40 MG capsule Take 40 mg by mouth daily as needed.     . Fluticasone-Umeclidin-Vilant (TRELEGY ELLIPTA) 100-62.5-25 MCG/INH AEPB Inhale 1 puff into the lungs daily. 1 each 6  . Fluticasone-Umeclidin-Vilant (TRELEGY ELLIPTA) 100-62.5-25 MCG/INH AEPB Inhale 1 puff into the lungs daily. 1 each 0  . lidocaine-prilocaine (EMLA)  cream Apply 1 application topically as needed. 30 g 0  . loratadine (CLARITIN) 10 MG tablet Take 10 mg by mouth daily.    Marland Kitchen losartan (COZAAR) 50 MG tablet Take 100 mg by mouth daily.    . metoprolol tartrate (LOPRESSOR) 25 MG tablet Take 25 mg by mouth 2 (two) times daily.    . montelukast (SINGULAIR) 10 MG tablet Take 1 tablet (10 mg total) by mouth at bedtime. 30 tablet 11  . multivitamin-iron-minerals-folic acid (CENTRUM) chewable tablet Chew 1 tablet by mouth daily.     Marland Kitchen oxyCODONE (OXY IR/ROXICODONE) 5 MG immediate release tablet Take 1 tablet (5 mg total) by mouth every 6 (six) hours as needed for moderate pain or severe pain. 30 tablet 0  . potassium chloride (K-DUR,KLOR-CON) 10 MEQ tablet Take 1 tablet by mouth daily.    . predniSONE (DELTASONE) 50 MG tablet TAKE TWO TABLETS BY MOUTH FOR 5 DAYS WITH EACH CYCLE OF CHEMO 30 tablet 1  . prochlorperazine (COMPAZINE) 10 MG tablet Take 1 tablet (10 mg total) by mouth every 6 (six) hours as needed for nausea or vomiting. 30 tablet 0  . tiZANidine (ZANAFLEX) 2 MG tablet Take 1 tablet by mouth daily as needed.    . traMADol (ULTRAM) 50 MG tablet Take 50 mg by mouth 2 (two) times daily as needed.    . VENTOLIN HFA 108 (90 Base) MCG/ACT inhaler Inhale 1-2 puffs into the lungs every 4 (four) hours as needed for shortness of breath. 1 Inhaler 5   No current facility-administered medications for this visit.    Facility-Administered Medications Ordered in Other Visits  Medication Dose Route Frequency Provider Last Rate Last Dose  . cyclophosphamide (CYTOXAN) 1,500 mg in sodium chloride 0.9 % 250 mL chemo infusion  750 mg/m2 (Treatment Plan Recorded) Intravenous Once [name], MD 650 mL/hr at 07/22/17 1234 1,500 mg at 07/22/17 1234  . heparin lock flush 100 unit/mL  500 Units Intracatheter Once PRN [name], MD      . riTUXimab (RITUXAN) 800 mg in sodium chloride 0.9 % 170 mL infusion  375 mg/m2 (Treatment Plan Recorded) Intravenous Once [name], MD      . sodium chloride flush (NS) 0.9 % injection 10 mL  10 mL Intracatheter PRN [name], MD        SURGICAL HISTORY:  Past Surgical History:  Procedure Laterality Date  . CATARACT EXTRACTION W/ INTRAOCULAR LENS  IMPLANT, BILATERAL    . LEAD REMOVAL Left 05/04/2014   Procedure: CRYO INTERCOSTAL NERVE BLOCK;  Surgeon: [name], MD;  Location: [facility];  Service: Thoracic;  Laterality: Left;  . LUNG LOBECTOMY  05/04/14   left upper  lobectomy  . NODE DISSECTION Left 05/04/2014   Procedure: NODE DISSECTION;  Surgeon: [name], MD;  Location: [facility];  Service: Thoracic;  Laterality: Left;  . PORTACATH PLACEMENT Left 06/10/2017   Procedure: INSERTION PORT-A-CATH;  Surgeon: [name], MD;  Location: [facility];  Service: Thoracic;  Laterality: Left;  Marland Kitchen VAGINAL HYSTERECTOMY  1966  . VIDEO ASSISTED THORACOSCOPY (VATS)/ LOBECTOMY Left 05/04/2014   Procedure: LEFT VIDEO ASSISTED THORACOSCOPY (VATS)/LEFT UPPER LOBECTOMY;  Surgeon: [name], MD;  Location: [facility];  Service: Thoracic;  Laterality: Left;    REVIEW OF SYSTEMS:   Review of Systems  Constitutional: Negative for appetite change, chills, fever and unexpected weight change. Positive for fatigue. HENT:   Negative for mouth sores, nosebleeds, sore throat and trouble swallowing.   Eyes: Negative  for eye problems and icterus.  Respiratory: Negative for hemoptysis and wheezing.  Positive for her baseline cough and shortness of breath with exertion.  She wears home oxygen.  Cardiovascular: Negative for chest pain. She notes lower extremity edema bilaterally at night. Gastrointestinal: Negative for abdominal pain, constipation, diarrhea, and vomiting. Positive for intermittent nausea. Genitourinary: Negative for bladder incontinence, difficulty urinating, dysuria, frequency and hematuria.   Musculoskeletal: Negative for back pain, gait problem, neck pain and neck stiffness.  Skin: Negative for itching and rash.  Neurological: Negative for dizziness, extremity weakness, gait problem, headaches, light-headedness and seizures.  Hematological: Negative for adenopathy. Does not bruise/bleed easily.  Psychiatric/Behavioral: Negative for confusion, depression and sleep disturbance. The patient is not nervous/anxious.     PHYSICAL EXAMINATION:  Blood pressure 116/65, pulse 96, temperature 97.8 F (36.6 C), temperature source Oral, resp. rate 17, height 5\' 1"   (1.549 m), weight 198 lb (89.8 kg), SpO2 92 %.  ECOG PERFORMANCE STATUS: 1 - Symptomatic but completely ambulatory  Physical Exam  Constitutional: Oriented to person, place, and time and well-developed, well-nourished, and in no distress. No distress.  HENT:  Head: Normocephalic and atraumatic.  Mouth/Throat: Oropharynx is clear and moist. No oropharyngeal exudate.  Eyes: Conjunctivae are normal. Right eye exhibits no discharge. Left eye exhibits no discharge. No scleral icterus.  Neck: Normal range of motion. Neck supple.  Cardiovascular: Normal rate, regular rhythm, normal heart sounds and intact distal pulses.   Pulmonary/Chest: Effort normal and breath sounds normal. No respiratory distress. No wheezes. No rales.  Abdominal: Soft. Bowel sounds are normal. Exhibits no distension and no mass. There is no tenderness.  Musculoskeletal: Normal range of motion. Exhibits no edema.  Lymphadenopathy:    No cervical adenopathy.  Neurological: Alert and oriented to person, place, and time. Exhibits normal muscle tone. Gait normal. Coordination normal.  Skin: Skin is warm and dry. No rash noted. Not diaphoretic. No erythema. No pallor.  Psychiatric: Mood, memory and judgment normal.  Vitals reviewed.  LABORATORY DATA: Lab Results  Component Value Date   WBC 12.6 (H) 07/22/2017   HGB 9.1 (L) 07/22/2017   HCT 27.8 (L) 07/22/2017   MCV 84.9 07/22/2017   PLT 424 (H) 07/22/2017      Chemistry      Component Value Date/Time   NA 137 07/22/2017 1025   NA 144 01/05/2017 1017   K 4.1 07/22/2017 1025   K 3.8 01/05/2017 1017   CL 105 07/22/2017 1025   CO2 22 07/22/2017 1025   CO2 23 01/05/2017 1017   BUN 12 07/22/2017 1025   BUN 21.5 01/05/2017 1017   CREATININE 0.96 07/22/2017 1025   CREATININE 1.2 (H) 01/05/2017 1017      Component Value Date/Time   CALCIUM 9.2 07/22/2017 1025   CALCIUM 9.6 01/05/2017 1017   ALKPHOS 105 07/22/2017 1025   ALKPHOS 104 01/05/2017 1017   AST 21  07/22/2017 1025   AST 15 01/05/2017 1017   ALT 12 07/22/2017 1025   ALT 9 01/05/2017 1017   BILITOT 0.3 07/22/2017 1025   BILITOT 0.37 01/05/2017 1017       RADIOGRAPHIC STUDIES:  No results found.   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 The patient recently diagnosed with large B cell non-Hodgkin lymphoma. She is currently undergoing  systemic chemotherapy with a CHOP/Rituxan status post 2 cycles.  She is tolerating her treatment well with the exception of fatigue, anemia, hiccups, cramping, and lower extremity edema. Recommend for the patient to proceed with cycle 3 of her treatment as scheduled today.  She will have a restaging CT scan of the chest, abdomen, pelvis prior to her next visit.  We will also check a CT of the neck given the hypermetabolic area in the uvula for close monitoring.  She will follow-up in 3 weeks for evaluation prior to cycle #4 and to discuss her restaging CT scan results.  The patient has fatigue which is likely related to her chemotherapy and her anemia.  We discussed that we will continue to monitor her labs very closely and if her hemoglobin is 8.0 or less we will consider a blood transfusion.  For the patient's hand and lower extremity cramping we will also add a magnesium level today.  If abnormal we will replace.  Potassium level is normal today.  She was encouraged to remain hydrated.  For the patient's hiccups she was given a prescription for baclofen 10 mg every 8 hours as needed.  This may also help her cramping.  The patient has no lower extremity edema on exam today.  Her edema worsens at the end of the day when she has been upright.  We discussed reducing her sodium intake and elevating her legs as  much as possible.  She admits to adding salt to her food because it does not taste very good.  I also discussed that her prednisone may be playing a factor into some of her lower extremity edema.  For COPD, she will continue her current inhaler as prescribed by her pulmonologist.  The patient was advised to call immediately if she has any concerning symptoms in the interval. The patient voices understanding of current disease status and treatment options and is in agreement with the current care plan.  All questions were answered. The patient knows to call the clinic with any problems, questions or concerns. We can certainly see the patient much sooner if necessary.   Orders Placed This Encounter  Procedures  . CT CHEST W CONTRAST    Standing Status:   Future    Standing Expiration Date:   07/23/2018    Order Specific Question:   If indicated for the ordered procedure, I authorize the administration of contrast media per Radiology protocol    Answer:   Yes    Order Specific Question:   Preferred imaging location?    Answer:   [facility]    Order Specific Question:   Radiology Contrast Protocol - do NOT remove file path    Answer:   \\[email or web address]    Order Specific Question:   Reason for Exam additional comments    Answer:   Diffuse large B cel lymphoma. Restaging.  Marland Kitchen CT ABDOMEN PELVIS W CONTRAST    Standing Status:   Future    Standing Expiration Date:   07/23/2018    Order Specific Question:   If indicated for the ordered procedure, I authorize the administration of contrast media per Radiology protocol    Answer:   Yes    Order Specific Question:   Preferred imaging location?    Answer:   [facility]    Order Specific Question:   Radiology Contrast Protocol - do NOT remove file path    Answer:   [email or web address]    Order Specific Question:  Reason for Exam additional comments    Answer:   Diffuse large B  cel lymphoma. Restaging.  . CT Soft Tissue Neck W Contrast    Standing Status:   Future    Standing Expiration Date:   07/22/2018    Order Specific Question:   If indicated for the ordered procedure, I authorize the administration of contrast media per Radiology protocol    Answer:   Yes    Order Specific Question:   Preferred imaging location?    Answer:   [facility]    Order Specific Question:   Radiology Contrast Protocol - do NOT remove file path    Answer:   \\[email or web address]    Order Specific Question:   Reason for Exam additional comments    Answer:   hypermetabolic lesion in uvula on PET scan. F/U.  . Magnesium    Standing Status:   Future    Number of Occurrences:   1    Standing Expiration Date:   07/23/2018   Mikey Bussing, DNP, AGPCNP-BC, AOCNP 07/22/17

## 2017-07-22 NOTE — Patient Instructions (Signed)
[facility] Discharge Instructions for Patients Receiving Chemotherapy  Today you received the following chemotherapy agents:  Doxorubicin, Vincristine, Cytoxan, Rituxan  To help prevent nausea and vomiting after your treatment, we encourage you to take your nausea medication as prescribed.   If you develop nausea and vomiting that is not controlled by your nausea medication, call the clinic.   BELOW ARE SYMPTOMS THAT SHOULD BE REPORTED IMMEDIATELY:  *FEVER GREATER THAN 100.5 F  *CHILLS WITH OR WITHOUT FEVER  NAUSEA AND VOMITING THAT IS NOT CONTROLLED WITH YOUR NAUSEA MEDICATION  *UNUSUAL SHORTNESS OF BREATH  *UNUSUAL BRUISING OR BLEEDING  TENDERNESS IN MOUTH AND THROAT WITH OR WITHOUT PRESENCE OF ULCERS  *URINARY PROBLEMS  *BOWEL PROBLEMS  UNUSUAL RASH Items with * indicate a potential emergency and should be followed up as soon as possible.  Feel free to call the clinic should you have any questions or concerns. The clinic phone number is [phone].  Please show the [facility] at check-in to the Emergency Department and triage nurse.

## 2017-07-22 NOTE — Assessment & Plan Note (Addendum)
This is a very pleasant 84 year old African-American female with history of stage IIa non-small cell lung cancer status post left upper lobectomy with lymph node dissection followed by adjuvant chemotherapy. The patient recently diagnosed with large B cell non-Hodgkin lymphoma. She is currently undergoing systemic chemotherapy with a CHOP/Rituxan status post 2 cycles.  She is tolerating her treatment well with the exception of fatigue, anemia, hiccups, cramping, and lower extremity edema. Recommend for the patient to proceed with cycle 3 of her treatment as scheduled today.  She will have a restaging CT scan of the chest, abdomen, pelvis prior to her next visit.  We will also check a CT of the neck given the hypermetabolic area in the uvula for close monitoring.  She will follow-up in 3 weeks for evaluation prior to cycle #4 and to discuss her restaging CT scan results.  The patient has fatigue which is likely related to her chemotherapy and her anemia.  We discussed that we will continue to monitor her labs very closely and if her hemoglobin is 8.0 or less we will consider a blood transfusion.  For the patient's hand and lower extremity cramping we will also add a magnesium level today.  If abnormal we will replace.  Potassium level is normal today.  She was encouraged to remain hydrated.  For the patient's hiccups she was given a prescription for baclofen 10 mg every 8 hours as needed.  This may also help her cramping.  The patient has no lower extremity edema on exam today.  Her edema worsens at the end of the day when she has been upright.  We discussed reducing her sodium intake and elevating her legs as much as possible.  She admits to adding salt to her food because it does not taste very good.  I also discussed that her prednisone may be playing a factor into some of her lower extremity edema.  For COPD, she will continue her current inhaler as prescribed by her pulmonologist.  The patient was  advised to call immediately if she has any concerning symptoms in the interval. The patient voices understanding of current disease status and treatment options and is in agreement with the current care plan.  All questions were answered. The patient knows to call the clinic with any problems, questions or concerns. We can certainly see the patient much sooner if necessary.

## 2017-07-22 NOTE — Telephone Encounter (Signed)
Scheduled appt per 6/12 los - pt to get an updated scheduled next visit.

## 2017-07-24 ENCOUNTER — Inpatient Hospital Stay: Payer: Medicare HMO

## 2017-07-24 VITALS — BP 161/79 | HR 89

## 2017-07-24 DIAGNOSIS — Z5111 Encounter for antineoplastic chemotherapy: Secondary | ICD-10-CM | POA: Diagnosis not present

## 2017-07-24 DIAGNOSIS — C8332 Diffuse large B-cell lymphoma, intrathoracic lymph nodes: Secondary | ICD-10-CM

## 2017-07-24 MED ORDER — PEGFILGRASTIM-CBQV 6 MG/0.6ML ~~LOC~~ SOSY
PREFILLED_SYRINGE | SUBCUTANEOUS | Status: AC
  Filled 2017-07-24: qty 0.6

## 2017-07-24 MED ORDER — PEGFILGRASTIM-CBQV 6 MG/0.6ML ~~LOC~~ SOSY
6.0000 mg | PREFILLED_SYRINGE | Freq: Once | SUBCUTANEOUS | Status: AC
  Administered 2017-07-24: 6 mg via SUBCUTANEOUS

## 2017-07-29 ENCOUNTER — Inpatient Hospital Stay: Payer: Medicare HMO

## 2017-07-29 ENCOUNTER — Telehealth: Payer: Self-pay

## 2017-07-29 DIAGNOSIS — Z95828 Presence of other vascular implants and grafts: Secondary | ICD-10-CM

## 2017-07-29 DIAGNOSIS — C8332 Diffuse large B-cell lymphoma, intrathoracic lymph nodes: Secondary | ICD-10-CM

## 2017-07-29 LAB — CMP (CANCER CENTER ONLY)
ALK PHOS: 93 U/L (ref 40–150)
ALT: 8 U/L (ref 0–55)
ANION GAP: 8 (ref 3–11)
AST: 11 U/L (ref 5–34)
Albumin: 2.8 g/dL — ABNORMAL LOW (ref 3.5–5.0)
BILIRUBIN TOTAL: 0.7 mg/dL (ref 0.2–1.2)
BUN: 16 mg/dL (ref 7–26)
CALCIUM: 9.9 mg/dL (ref 8.4–10.4)
CO2: 24 mmol/L (ref 22–29)
CREATININE: 0.9 mg/dL (ref 0.60–1.10)
Chloride: 108 mmol/L (ref 98–109)
GFR, Estimated: 58 mL/min — ABNORMAL LOW (ref >60)
GLUCOSE: 169 mg/dL — AB (ref 70–140)
Potassium: 4 mmol/L (ref 3.5–5.1)
Sodium: 140 mmol/L (ref 136–145)
TOTAL PROTEIN: 6.3 g/dL — AB (ref 6.4–8.3)

## 2017-07-29 LAB — CBC WITH DIFFERENTIAL (CANCER CENTER ONLY)
Basophils Absolute: 0 10*3/uL (ref 0.0–0.1)
Basophils Relative: 1 %
Eosinophils Absolute: 0 10*3/uL (ref 0.0–0.5)
Eosinophils Relative: 3 %
HEMATOCRIT: 25.1 % — AB (ref 34.8–46.6)
HEMOGLOBIN: 8.2 g/dL — AB (ref 11.6–15.9)
LYMPHS ABS: 0.3 10*3/uL — AB (ref 0.9–3.3)
LYMPHS PCT: 43 %
MCH: 27.8 pg (ref 25.1–34.0)
MCHC: 32.9 g/dL (ref 31.5–36.0)
MCV: 84.4 fL (ref 79.5–101.0)
Monocytes Absolute: 0 10*3/uL — ABNORMAL LOW (ref 0.1–0.9)
Monocytes Relative: 6 %
NEUTROS ABS: 0.4 10*3/uL — AB (ref 1.5–6.5)
NEUTROS PCT: 47 %
Platelet Count: 141 10*3/uL — ABNORMAL LOW (ref 145–400)
RBC: 2.97 MIL/uL — ABNORMAL LOW (ref 3.70–5.45)
RDW: 17.7 % — ABNORMAL HIGH (ref 11.2–14.5)
WBC Count: 0.7 10*3/uL — CL (ref 3.9–10.3)

## 2017-07-29 MED ORDER — HEPARIN SOD (PORK) LOCK FLUSH 100 UNIT/ML IV SOLN
250.0000 [IU] | Freq: Once | INTRAVENOUS | Status: AC
  Administered 2017-07-29: 250 [IU]
  Filled 2017-07-29: qty 5

## 2017-07-29 MED ORDER — SODIUM CHLORIDE 0.9% FLUSH
10.0000 mL | Freq: Once | INTRAVENOUS | Status: AC
  Administered 2017-07-29: 10 mL
  Filled 2017-07-29: qty 10

## 2017-07-29 NOTE — Telephone Encounter (Signed)
Received call from [facility] in the lab with critical result of WBC 0.7. Mikey Bussing NP made ware.

## 2017-07-29 NOTE — Telephone Encounter (Signed)
Contacted patient per request of Mikey Bussing NP to inform of low WBC and Hbg. Patient made aware of neutropenic precautions and to call the cancer center with a fever of 100.5 or higher. Upon further questioning patient denied any SOB or fatigue different from her baseline. She stated that she wears 3L O2 continuous and is always SOB with exertion. She stated that she does not feel that she needs a blood transfusion at this time. She verbalized understanding of next appointment on 6/26 and her labs will be drawn and reviewed again at that time. She had no other questions or concerns and made aware how to make contact if any questions or concerns arise.

## 2017-07-31 ENCOUNTER — Other Ambulatory Visit: Payer: Self-pay

## 2017-07-31 ENCOUNTER — Emergency Department (HOSPITAL_COMMUNITY): Payer: Medicare HMO

## 2017-07-31 ENCOUNTER — Encounter (HOSPITAL_COMMUNITY): Payer: Self-pay

## 2017-07-31 ENCOUNTER — Other Ambulatory Visit (HOSPITAL_COMMUNITY): Payer: Self-pay

## 2017-07-31 ENCOUNTER — Inpatient Hospital Stay (HOSPITAL_COMMUNITY)
Admission: EM | Admit: 2017-07-31 | Discharge: 2017-08-10 | DRG: 871 | Disposition: E | Payer: Medicare HMO | Attending: Pulmonary Disease | Admitting: Pulmonary Disease

## 2017-07-31 DIAGNOSIS — M1990 Unspecified osteoarthritis, unspecified site: Secondary | ICD-10-CM | POA: Diagnosis present

## 2017-07-31 DIAGNOSIS — A419 Sepsis, unspecified organism: Secondary | ICD-10-CM | POA: Diagnosis not present

## 2017-07-31 DIAGNOSIS — D638 Anemia in other chronic diseases classified elsewhere: Secondary | ICD-10-CM | POA: Diagnosis present

## 2017-07-31 DIAGNOSIS — Z85118 Personal history of other malignant neoplasm of bronchus and lung: Secondary | ICD-10-CM

## 2017-07-31 DIAGNOSIS — Z79899 Other long term (current) drug therapy: Secondary | ICD-10-CM

## 2017-07-31 DIAGNOSIS — G9341 Metabolic encephalopathy: Secondary | ICD-10-CM | POA: Diagnosis present

## 2017-07-31 DIAGNOSIS — R0602 Shortness of breath: Secondary | ICD-10-CM

## 2017-07-31 DIAGNOSIS — N170 Acute kidney failure with tubular necrosis: Secondary | ICD-10-CM | POA: Diagnosis present

## 2017-07-31 DIAGNOSIS — J441 Chronic obstructive pulmonary disease with (acute) exacerbation: Secondary | ICD-10-CM | POA: Diagnosis present

## 2017-07-31 DIAGNOSIS — Z01818 Encounter for other preprocedural examination: Secondary | ICD-10-CM

## 2017-07-31 DIAGNOSIS — J9600 Acute respiratory failure, unspecified whether with hypoxia or hypercapnia: Secondary | ICD-10-CM

## 2017-07-31 DIAGNOSIS — Z9841 Cataract extraction status, right eye: Secondary | ICD-10-CM

## 2017-07-31 DIAGNOSIS — Z9911 Dependence on respirator [ventilator] status: Secondary | ICD-10-CM

## 2017-07-31 DIAGNOSIS — T451X5A Adverse effect of antineoplastic and immunosuppressive drugs, initial encounter: Secondary | ICD-10-CM | POA: Diagnosis present

## 2017-07-31 DIAGNOSIS — I10 Essential (primary) hypertension: Secondary | ICD-10-CM | POA: Diagnosis present

## 2017-07-31 DIAGNOSIS — E119 Type 2 diabetes mellitus without complications: Secondary | ICD-10-CM

## 2017-07-31 DIAGNOSIS — I469 Cardiac arrest, cause unspecified: Secondary | ICD-10-CM | POA: Diagnosis not present

## 2017-07-31 DIAGNOSIS — K219 Gastro-esophageal reflux disease without esophagitis: Secondary | ICD-10-CM | POA: Diagnosis present

## 2017-07-31 DIAGNOSIS — Z9221 Personal history of antineoplastic chemotherapy: Secondary | ICD-10-CM

## 2017-07-31 DIAGNOSIS — Z978 Presence of other specified devices: Secondary | ICD-10-CM

## 2017-07-31 DIAGNOSIS — Z7951 Long term (current) use of inhaled steroids: Secondary | ICD-10-CM

## 2017-07-31 DIAGNOSIS — E872 Acidosis: Secondary | ICD-10-CM | POA: Diagnosis not present

## 2017-07-31 DIAGNOSIS — K922 Gastrointestinal hemorrhage, unspecified: Secondary | ICD-10-CM

## 2017-07-31 DIAGNOSIS — J440 Chronic obstructive pulmonary disease with acute lower respiratory infection: Secondary | ICD-10-CM | POA: Diagnosis present

## 2017-07-31 DIAGNOSIS — Y95 Nosocomial condition: Secondary | ICD-10-CM | POA: Diagnosis present

## 2017-07-31 DIAGNOSIS — J449 Chronic obstructive pulmonary disease, unspecified: Secondary | ICD-10-CM | POA: Diagnosis present

## 2017-07-31 DIAGNOSIS — Z87891 Personal history of nicotine dependence: Secondary | ICD-10-CM

## 2017-07-31 DIAGNOSIS — Z888 Allergy status to other drugs, medicaments and biological substances status: Secondary | ICD-10-CM

## 2017-07-31 DIAGNOSIS — Z7952 Long term (current) use of systemic steroids: Secondary | ICD-10-CM

## 2017-07-31 DIAGNOSIS — Z961 Presence of intraocular lens: Secondary | ICD-10-CM

## 2017-07-31 DIAGNOSIS — J9621 Acute and chronic respiratory failure with hypoxia: Secondary | ICD-10-CM

## 2017-07-31 DIAGNOSIS — D62 Acute posthemorrhagic anemia: Secondary | ICD-10-CM | POA: Diagnosis present

## 2017-07-31 DIAGNOSIS — E876 Hypokalemia: Secondary | ICD-10-CM | POA: Diagnosis present

## 2017-07-31 DIAGNOSIS — J9601 Acute respiratory failure with hypoxia: Secondary | ICD-10-CM

## 2017-07-31 DIAGNOSIS — E875 Hyperkalemia: Secondary | ICD-10-CM | POA: Diagnosis not present

## 2017-07-31 DIAGNOSIS — J181 Lobar pneumonia, unspecified organism: Secondary | ICD-10-CM | POA: Diagnosis present

## 2017-07-31 DIAGNOSIS — J189 Pneumonia, unspecified organism: Secondary | ICD-10-CM

## 2017-07-31 DIAGNOSIS — J9622 Acute and chronic respiratory failure with hypercapnia: Secondary | ICD-10-CM | POA: Diagnosis present

## 2017-07-31 DIAGNOSIS — J9690 Respiratory failure, unspecified, unspecified whether with hypoxia or hypercapnia: Secondary | ICD-10-CM

## 2017-07-31 DIAGNOSIS — Z9981 Dependence on supplemental oxygen: Secondary | ICD-10-CM

## 2017-07-31 DIAGNOSIS — Z4659 Encounter for fitting and adjustment of other gastrointestinal appliance and device: Secondary | ICD-10-CM

## 2017-07-31 DIAGNOSIS — T380X5A Adverse effect of glucocorticoids and synthetic analogues, initial encounter: Secondary | ICD-10-CM | POA: Diagnosis present

## 2017-07-31 DIAGNOSIS — F419 Anxiety disorder, unspecified: Secondary | ICD-10-CM | POA: Diagnosis present

## 2017-07-31 DIAGNOSIS — I5030 Unspecified diastolic (congestive) heart failure: Secondary | ICD-10-CM | POA: Diagnosis present

## 2017-07-31 DIAGNOSIS — R6521 Severe sepsis with septic shock: Secondary | ICD-10-CM | POA: Diagnosis not present

## 2017-07-31 DIAGNOSIS — R40241 Glasgow coma scale score 13-15, unspecified time: Secondary | ICD-10-CM | POA: Diagnosis present

## 2017-07-31 DIAGNOSIS — R195 Other fecal abnormalities: Secondary | ICD-10-CM | POA: Diagnosis present

## 2017-07-31 DIAGNOSIS — Z9842 Cataract extraction status, left eye: Secondary | ICD-10-CM

## 2017-07-31 DIAGNOSIS — E1165 Type 2 diabetes mellitus with hyperglycemia: Secondary | ICD-10-CM | POA: Diagnosis present

## 2017-07-31 DIAGNOSIS — M109 Gout, unspecified: Secondary | ICD-10-CM | POA: Diagnosis present

## 2017-07-31 DIAGNOSIS — C8332 Diffuse large B-cell lymphoma, intrathoracic lymph nodes: Secondary | ICD-10-CM | POA: Diagnosis present

## 2017-07-31 DIAGNOSIS — N390 Urinary tract infection, site not specified: Secondary | ICD-10-CM | POA: Diagnosis present

## 2017-07-31 DIAGNOSIS — I110 Hypertensive heart disease with heart failure: Secondary | ICD-10-CM | POA: Diagnosis present

## 2017-07-31 LAB — COMPREHENSIVE METABOLIC PANEL
ALT: 21 U/L (ref 14–54)
AST: 26 U/L (ref 15–41)
Albumin: 2.6 g/dL — ABNORMAL LOW (ref 3.5–5.0)
Alkaline Phosphatase: 95 U/L (ref 38–126)
Anion gap: 12 (ref 5–15)
BUN: 18 mg/dL (ref 6–20)
CO2: 21 mmol/L — ABNORMAL LOW (ref 22–32)
Calcium: 9 mg/dL (ref 8.9–10.3)
Chloride: 105 mmol/L (ref 101–111)
Creatinine, Ser: 1.17 mg/dL — ABNORMAL HIGH (ref 0.44–1.00)
GFR calc Af Amer: 49 mL/min — ABNORMAL LOW (ref >60)
GFR calc non Af Amer: 43 mL/min — ABNORMAL LOW (ref >60)
Glucose, Bld: 293 mg/dL — ABNORMAL HIGH (ref 65–99)
Potassium: 3.2 mmol/L — ABNORMAL LOW (ref 3.5–5.1)
Sodium: 138 mmol/L (ref 135–145)
Total Bilirubin: 1.1 mg/dL (ref 0.3–1.2)
Total Protein: 6.5 g/dL (ref 6.5–8.1)

## 2017-07-31 LAB — I-STAT CG4 LACTIC ACID, ED
Lactic Acid, Venous: 4.4 mmol/L (ref 0.5–1.9)
Lactic Acid, Venous: 6.89 mmol/L (ref 0.5–1.9)

## 2017-07-31 LAB — CBC WITH DIFFERENTIAL/PLATELET
Band Neutrophils: 11 %
Basophils Absolute: 0 10*3/uL (ref 0.0–0.1)
Basophils Relative: 1 %
Eosinophils Absolute: 0 10*3/uL (ref 0.0–0.7)
Eosinophils Relative: 0 %
HCT: 22.4 % — ABNORMAL LOW (ref 36.0–46.0)
Hemoglobin: 7.6 g/dL — ABNORMAL LOW (ref 12.0–15.0)
Lymphocytes Relative: 30 %
Lymphs Abs: 1.2 10*3/uL (ref 0.7–4.0)
MCH: 28.3 pg (ref 26.0–34.0)
MCHC: 33.9 g/dL (ref 30.0–36.0)
MCV: 83.3 fL (ref 78.0–100.0)
Monocytes Absolute: 0.1 10*3/uL (ref 0.1–1.0)
Monocytes Relative: 2 %
Myelocytes: 2 %
Neutro Abs: 2.8 10*3/uL (ref 1.7–7.7)
Neutrophils Relative %: 54 %
Platelets: 80 10*3/uL — ABNORMAL LOW (ref 150–400)
RBC: 2.69 MIL/uL — ABNORMAL LOW (ref 3.87–5.11)
RDW: 16.8 % — ABNORMAL HIGH (ref 11.5–15.5)
WBC: 4.1 10*3/uL (ref 4.0–10.5)
nRBC: 2 /100 WBC — ABNORMAL HIGH

## 2017-07-31 LAB — BRAIN NATRIURETIC PEPTIDE: B Natriuretic Peptide: 326.6 pg/mL — ABNORMAL HIGH (ref 0.0–100.0)

## 2017-07-31 LAB — I-STAT TROPONIN, ED: Troponin i, poc: 0.11 ng/mL (ref 0.00–0.08)

## 2017-07-31 MED ORDER — SODIUM CHLORIDE 0.9 % IV BOLUS (SEPSIS)
1000.0000 mL | Freq: Once | INTRAVENOUS | Status: AC
  Administered 2017-08-01: 1000 mL via INTRAVENOUS

## 2017-07-31 MED ORDER — SODIUM CHLORIDE 0.9 % IV BOLUS
1000.0000 mL | Freq: Once | INTRAVENOUS | Status: AC
  Administered 2017-07-31: 1000 mL via INTRAVENOUS

## 2017-07-31 MED ORDER — SODIUM CHLORIDE 0.9 % IV SOLN
1.0000 g | Freq: Once | INTRAVENOUS | Status: AC
  Administered 2017-07-31: 1 g via INTRAVENOUS
  Filled 2017-07-31: qty 1

## 2017-07-31 MED ORDER — IOPAMIDOL (ISOVUE-370) INJECTION 76%
INTRAVENOUS | Status: AC
  Administered 2017-07-31: 22:00:00
  Filled 2017-07-31: qty 100

## 2017-07-31 MED ORDER — VANCOMYCIN HCL 10 G IV SOLR
2000.0000 mg | Freq: Once | INTRAVENOUS | Status: AC
  Administered 2017-07-31: 2000 mg via INTRAVENOUS
  Filled 2017-07-31: qty 2000

## 2017-07-31 MED ORDER — IOPAMIDOL (ISOVUE-370) INJECTION 76%
100.0000 mL | Freq: Once | INTRAVENOUS | Status: AC | PRN
  Administered 2017-07-31: 100 mL via INTRAVENOUS

## 2017-07-31 NOTE — ED Notes (Addendum)
Pt aware urine sample is needed. Purewick in place.

## 2017-07-31 NOTE — ED Triage Notes (Signed)
Pt BIB [facility] from home c/o SOB. Hx lung cancer and partial left lobectomy. Reports general malaise since last chemo on Wednesday. Increased dyspnea especially with exertion. Normally on 3L at home. 84% on home O2 when EMS arrived. Lung sounds diminished. Pt given 1 duoneb, 1 regular neb, 125 solumedrol and 2 of mag. Daughter at bedside.

## 2017-07-31 NOTE — ED Notes (Signed)
Patient transported to CT 

## 2017-07-31 NOTE — Progress Notes (Signed)
A consult was received from an ED physician for Vancomycin per pharmacy dosing.  The patient's profile has been reviewed for ht/wt/allergies/indication/available labs.   A one time order has been placed for Vancomycin 2gm IV x1.  Further antibiotics/pharmacy consults should be ordered by admitting physician if indicated.                       Thank you, Biagio Borg 08/07/2017  9:55 PM

## 2017-07-31 NOTE — ED Notes (Signed)
Blood cultures ordered after antibiotics started

## 2017-07-31 NOTE — ED Notes (Signed)
Bed: RESB Expected date:  Expected time:  Means of arrival:  Comments: 29 F Resp Disress (lung cancer)

## 2017-08-01 ENCOUNTER — Encounter (HOSPITAL_COMMUNITY): Payer: Self-pay

## 2017-08-01 DIAGNOSIS — A419 Sepsis, unspecified organism: Secondary | ICD-10-CM | POA: Diagnosis present

## 2017-08-01 DIAGNOSIS — E1165 Type 2 diabetes mellitus with hyperglycemia: Secondary | ICD-10-CM | POA: Diagnosis present

## 2017-08-01 DIAGNOSIS — J189 Pneumonia, unspecified organism: Secondary | ICD-10-CM | POA: Diagnosis not present

## 2017-08-01 DIAGNOSIS — R6521 Severe sepsis with septic shock: Secondary | ICD-10-CM | POA: Diagnosis not present

## 2017-08-01 DIAGNOSIS — J440 Chronic obstructive pulmonary disease with acute lower respiratory infection: Secondary | ICD-10-CM | POA: Diagnosis present

## 2017-08-01 DIAGNOSIS — J9621 Acute and chronic respiratory failure with hypoxia: Secondary | ICD-10-CM

## 2017-08-01 DIAGNOSIS — N170 Acute kidney failure with tubular necrosis: Secondary | ICD-10-CM | POA: Diagnosis present

## 2017-08-01 DIAGNOSIS — C8332 Diffuse large B-cell lymphoma, intrathoracic lymph nodes: Secondary | ICD-10-CM | POA: Diagnosis present

## 2017-08-01 DIAGNOSIS — E876 Hypokalemia: Secondary | ICD-10-CM | POA: Diagnosis present

## 2017-08-01 DIAGNOSIS — R0602 Shortness of breath: Secondary | ICD-10-CM | POA: Diagnosis present

## 2017-08-01 DIAGNOSIS — R40241 Glasgow coma scale score 13-15, unspecified time: Secondary | ICD-10-CM | POA: Diagnosis present

## 2017-08-01 DIAGNOSIS — G9341 Metabolic encephalopathy: Secondary | ICD-10-CM | POA: Diagnosis present

## 2017-08-01 DIAGNOSIS — I10 Essential (primary) hypertension: Secondary | ICD-10-CM | POA: Diagnosis not present

## 2017-08-01 DIAGNOSIS — M1990 Unspecified osteoarthritis, unspecified site: Secondary | ICD-10-CM | POA: Diagnosis present

## 2017-08-01 DIAGNOSIS — K922 Gastrointestinal hemorrhage, unspecified: Secondary | ICD-10-CM

## 2017-08-01 DIAGNOSIS — E872 Acidosis: Secondary | ICD-10-CM | POA: Diagnosis not present

## 2017-08-01 DIAGNOSIS — J441 Chronic obstructive pulmonary disease with (acute) exacerbation: Secondary | ICD-10-CM | POA: Diagnosis present

## 2017-08-01 DIAGNOSIS — J9601 Acute respiratory failure with hypoxia: Secondary | ICD-10-CM | POA: Diagnosis not present

## 2017-08-01 DIAGNOSIS — G9340 Encephalopathy, unspecified: Secondary | ICD-10-CM | POA: Diagnosis not present

## 2017-08-01 DIAGNOSIS — J181 Lobar pneumonia, unspecified organism: Secondary | ICD-10-CM | POA: Diagnosis present

## 2017-08-01 DIAGNOSIS — F419 Anxiety disorder, unspecified: Secondary | ICD-10-CM | POA: Diagnosis present

## 2017-08-01 DIAGNOSIS — N390 Urinary tract infection, site not specified: Secondary | ICD-10-CM | POA: Diagnosis present

## 2017-08-01 DIAGNOSIS — E118 Type 2 diabetes mellitus with unspecified complications: Secondary | ICD-10-CM

## 2017-08-01 DIAGNOSIS — N179 Acute kidney failure, unspecified: Secondary | ICD-10-CM | POA: Diagnosis not present

## 2017-08-01 DIAGNOSIS — J9622 Acute and chronic respiratory failure with hypercapnia: Secondary | ICD-10-CM | POA: Diagnosis present

## 2017-08-01 DIAGNOSIS — Z978 Presence of other specified devices: Secondary | ICD-10-CM

## 2017-08-01 DIAGNOSIS — Y95 Nosocomial condition: Secondary | ICD-10-CM | POA: Diagnosis present

## 2017-08-01 DIAGNOSIS — I5030 Unspecified diastolic (congestive) heart failure: Secondary | ICD-10-CM | POA: Diagnosis present

## 2017-08-01 DIAGNOSIS — J449 Chronic obstructive pulmonary disease, unspecified: Secondary | ICD-10-CM

## 2017-08-01 DIAGNOSIS — D62 Acute posthemorrhagic anemia: Secondary | ICD-10-CM

## 2017-08-01 DIAGNOSIS — K219 Gastro-esophageal reflux disease without esophagitis: Secondary | ICD-10-CM | POA: Diagnosis present

## 2017-08-01 DIAGNOSIS — J9602 Acute respiratory failure with hypercapnia: Secondary | ICD-10-CM | POA: Diagnosis not present

## 2017-08-01 DIAGNOSIS — T451X5A Adverse effect of antineoplastic and immunosuppressive drugs, initial encounter: Secondary | ICD-10-CM | POA: Diagnosis present

## 2017-08-01 DIAGNOSIS — R195 Other fecal abnormalities: Secondary | ICD-10-CM | POA: Diagnosis present

## 2017-08-01 DIAGNOSIS — I110 Hypertensive heart disease with heart failure: Secondary | ICD-10-CM | POA: Diagnosis present

## 2017-08-01 DIAGNOSIS — I469 Cardiac arrest, cause unspecified: Secondary | ICD-10-CM | POA: Diagnosis not present

## 2017-08-01 LAB — GLUCOSE, CAPILLARY
GLUCOSE-CAPILLARY: 320 mg/dL — AB (ref 65–99)
GLUCOSE-CAPILLARY: 240 mg/dL — AB (ref 65–99)
GLUCOSE-CAPILLARY: 192 mg/dL — AB (ref 65–99)
Glucose-Capillary: 279 mg/dL — ABNORMAL HIGH (ref 65–99)
Glucose-Capillary: 249 mg/dL — ABNORMAL HIGH (ref 65–99)

## 2017-08-01 LAB — BLOOD GAS, ARTERIAL
Acid-base deficit: 8.6 mmol/L — ABNORMAL HIGH (ref 0.0–2.0)
Acid-base deficit: 12.7 mmol/L — ABNORMAL HIGH (ref 0.0–2.0)
Bicarbonate: 15 mmol/L — ABNORMAL LOW (ref 20.0–28.0)
Bicarbonate: 18.4 mmol/L — ABNORMAL LOW (ref 20.0–28.0)
Drawn by: 51425
Drawn by: 11249
FIO2: 100
LHR: 15 {breaths}/min
MECHVT: 400 mL
O2 Content: 6 L/min
O2 Saturation: 82.8 %
O2 Saturation: 85.4 %
PATIENT TEMPERATURE: 99.5
PEEP: 5 cmH2O
PO2 ART: 56.3 mmHg — AB (ref 83.0–108.0)
PO2 ART: 75.5 mmHg — AB (ref 83.0–108.0)
Patient temperature: 37.1
pCO2 arterial: 25.6 mmHg — ABNORMAL LOW (ref 32.0–48.0)
pCO2 arterial: 64.7 mmHg — ABNORMAL HIGH (ref 32.0–48.0)
pH, Arterial: 7.39 (ref 7.350–7.450)
pH, Arterial: 7.083 — CL (ref 7.350–7.450)

## 2017-08-01 LAB — URINALYSIS, ROUTINE W REFLEX MICROSCOPIC
Bilirubin Urine: NEGATIVE
Glucose, UA: NEGATIVE mg/dL
Ketones, ur: NEGATIVE mg/dL
Nitrite: NEGATIVE
Protein, ur: 30 mg/dL — AB
Specific Gravity, Urine: 1.038 — ABNORMAL HIGH (ref 1.005–1.030)
WBC, UA: >50 WBC/hpf — ABNORMAL HIGH (ref 0–5)
pH: 5 (ref 5.0–8.0)

## 2017-08-01 LAB — MRSA PCR SCREENING: MRSA by PCR: POSITIVE — AB

## 2017-08-01 LAB — HEMOGLOBIN AND HEMATOCRIT, BLOOD
HCT: 23.3 % — ABNORMAL LOW (ref 36.0–46.0)
Hemoglobin: 7.7 g/dL — ABNORMAL LOW (ref 12.0–15.0)

## 2017-08-01 LAB — TROPONIN I
TROPONIN I: 0.22 ng/mL — AB (ref <0.03)
Troponin I: 0.17 ng/mL (ref <0.03)

## 2017-08-01 LAB — ABO/RH: ABO/RH(D): O POS

## 2017-08-01 LAB — PROCALCITONIN: PROCALCITONIN: 1.23 ng/mL

## 2017-08-01 LAB — POC OCCULT BLOOD, ED: FECAL OCCULT BLD: POSITIVE — AB

## 2017-08-01 LAB — PREPARE RBC (CROSSMATCH)

## 2017-08-01 LAB — APTT: aPTT: 36 seconds (ref 24–36)

## 2017-08-01 LAB — PROTIME-INR
INR: 1.2
PROTHROMBIN TIME: 15.2 s (ref 11.4–15.2)

## 2017-08-01 LAB — LACTIC ACID, PLASMA: Lactic Acid, Venous: 2.8 mmol/L (ref 0.5–1.9)

## 2017-08-01 MED ORDER — IPRATROPIUM-ALBUTEROL 0.5-2.5 (3) MG/3ML IN SOLN: 3.0000 mL | RESPIRATORY_TRACT | Status: DC | PRN

## 2017-08-01 MED ORDER — LACTATED RINGERS IV SOLN
INTRAVENOUS | Status: DC
  Administered 2017-08-01 – 2017-08-02 (×2): via INTRAVENOUS

## 2017-08-01 MED ORDER — ALLOPURINOL 100 MG PO TABS
100.0000 mg | ORAL_TABLET | Freq: Two times a day (BID) | ORAL | Status: DC
  Administered 2017-08-01 – 2017-08-03 (×4): 100 mg via ORAL
  Filled 2017-08-01 (×4): qty 1

## 2017-08-01 MED ORDER — GUAIFENESIN ER 600 MG PO TB12
600.0000 mg | ORAL_TABLET | Freq: Two times a day (BID) | ORAL | Status: DC
  Administered 2017-08-01: 600 mg via ORAL
  Filled 2017-08-01: qty 1

## 2017-08-01 MED ORDER — METOPROLOL TARTRATE 25 MG PO TABS
25.0000 mg | ORAL_TABLET | Freq: Two times a day (BID) | ORAL | Status: DC
  Administered 2017-08-01 (×2): 25 mg via ORAL
  Filled 2017-08-01 (×3): qty 1

## 2017-08-01 MED ORDER — SODIUM CHLORIDE 0.9% FLUSH
3.0000 mL | Freq: Two times a day (BID) | INTRAVENOUS | Status: DC
  Administered 2017-08-01 – 2017-08-02 (×5): 3 mL via INTRAVENOUS
  Administered 2017-08-03: 6 mL via INTRAVENOUS
  Administered 2017-08-03: 3 mL via INTRAVENOUS

## 2017-08-01 MED ORDER — SODIUM CHLORIDE 0.9 % IV SOLN
500.0000 mg | INTRAVENOUS | Status: DC
  Administered 2017-08-01: 500 mg via INTRAVENOUS
  Filled 2017-08-01: qty 500

## 2017-08-01 MED ORDER — SODIUM CHLORIDE 0.9 % IV SOLN
10.0000 mL/h | Freq: Once | INTRAVENOUS | Status: AC
  Administered 2017-08-01: 10 mL/h via INTRAVENOUS

## 2017-08-01 MED ORDER — MONTELUKAST SODIUM 10 MG PO TABS
10.0000 mg | ORAL_TABLET | Freq: Every day | ORAL | Status: DC
  Filled 2017-08-01: qty 1

## 2017-08-01 MED ORDER — IPRATROPIUM-ALBUTEROL 0.5-2.5 (3) MG/3ML IN SOLN
3.0000 mL | Freq: Four times a day (QID) | RESPIRATORY_TRACT | Status: DC
  Administered 2017-08-01 – 2017-08-03 (×9): 3 mL via RESPIRATORY_TRACT
  Filled 2017-08-01 (×9): qty 3

## 2017-08-01 MED ORDER — INSULIN ASPART 100 UNIT/ML ~~LOC~~ SOLN
0.0000 [IU] | SUBCUTANEOUS | Status: DC
  Administered 2017-08-01: 7 [IU] via SUBCUTANEOUS
  Administered 2017-08-01: 3 [IU] via SUBCUTANEOUS
  Administered 2017-08-01: 2 [IU] via SUBCUTANEOUS
  Administered 2017-08-01: 3 [IU] via SUBCUTANEOUS
  Administered 2017-08-01: 5 [IU] via SUBCUTANEOUS
  Administered 2017-08-02: 7 [IU] via SUBCUTANEOUS
  Administered 2017-08-02: 2 [IU] via SUBCUTANEOUS
  Administered 2017-08-02: 5 [IU] via SUBCUTANEOUS
  Administered 2017-08-02 (×3): 9 [IU] via SUBCUTANEOUS
  Administered 2017-08-02: 3 [IU] via SUBCUTANEOUS
  Administered 2017-08-03: 7 [IU] via SUBCUTANEOUS
  Administered 2017-08-03: 2 [IU] via SUBCUTANEOUS

## 2017-08-01 MED ORDER — ENSURE ENLIVE PO LIQD
237.0000 mL | Freq: Two times a day (BID) | ORAL | Status: DC
  Administered 2017-08-01 (×2): 237 mL via ORAL

## 2017-08-01 MED ORDER — ACETAMINOPHEN 325 MG PO TABS: 650.0000 mg | ORAL_TABLET | Freq: Four times a day (QID) | ORAL | Status: DC | PRN

## 2017-08-01 MED ORDER — METHYLPREDNISOLONE SODIUM SUCC 125 MG IJ SOLR
60.0000 mg | Freq: Three times a day (TID) | INTRAMUSCULAR | Status: DC
  Administered 2017-08-01: 60 mg via INTRAVENOUS
  Filled 2017-08-01: qty 2

## 2017-08-01 MED ORDER — PANTOPRAZOLE SODIUM 40 MG PO TBEC
40.0000 mg | DELAYED_RELEASE_TABLET | Freq: Two times a day (BID) | ORAL | Status: DC
  Administered 2017-08-01 (×2): 40 mg via ORAL
  Filled 2017-08-01 (×3): qty 1

## 2017-08-01 MED ORDER — CHLORHEXIDINE GLUCONATE CLOTH 2 % EX PADS
6.0000 | MEDICATED_PAD | Freq: Every day | CUTANEOUS | Status: DC
  Administered 2017-08-01 – 2017-08-03 (×3): 6 via TOPICAL

## 2017-08-01 MED ORDER — FENTANYL 2500MCG IN NS 250ML (10MCG/ML) PREMIX INFUSION
25.0000 ug/h | INTRAVENOUS | Status: DC
  Administered 2017-08-01 – 2017-08-03 (×4): 50 ug/h via INTRAVENOUS
  Filled 2017-08-01 (×5): qty 250

## 2017-08-01 MED ORDER — TRAZODONE HCL 50 MG PO TABS: 50.0000 mg | ORAL_TABLET | Freq: Every evening | ORAL | Status: DC | PRN

## 2017-08-01 MED ORDER — SODIUM CHLORIDE 0.9 % IV SOLN
1.0000 g | INTRAVENOUS | Status: DC
  Administered 2017-08-01: 1 g via INTRAVENOUS
  Filled 2017-08-01: qty 1
  Filled 2017-08-01: qty 10

## 2017-08-01 MED ORDER — ONDANSETRON HCL 4 MG PO TABS: 4.0000 mg | ORAL_TABLET | Freq: Four times a day (QID) | ORAL | Status: DC | PRN

## 2017-08-01 MED ORDER — LIDOCAINE-PRILOCAINE 2.5-2.5 % EX CREA
1.0000 "application " | TOPICAL_CREAM | CUTANEOUS | Status: DC | PRN
  Filled 2017-08-01: qty 5

## 2017-08-01 MED ORDER — LORAZEPAM 2 MG/ML IJ SOLN
1.0000 mg | Freq: Once | INTRAMUSCULAR | Status: AC
  Administered 2017-08-01: 1 mg via INTRAVENOUS
  Filled 2017-08-01: qty 1

## 2017-08-01 MED ORDER — FENTANYL CITRATE (PF) 100 MCG/2ML IJ SOLN
50.0000 ug | Freq: Once | INTRAMUSCULAR | Status: AC
  Administered 2017-08-02: 50 ug via INTRAVENOUS

## 2017-08-01 MED ORDER — POTASSIUM CHLORIDE CRYS ER 20 MEQ PO TBCR
40.0000 meq | EXTENDED_RELEASE_TABLET | ORAL | Status: AC
Start: 2017-08-01 — End: 2017-08-01
  Administered 2017-08-01: 40 meq via ORAL
  Filled 2017-08-01: qty 2

## 2017-08-01 MED ORDER — GUAIFENESIN 100 MG/5ML PO SOLN
5.0000 mL | ORAL | Status: DC | PRN
  Administered 2017-08-01: 100 mg via ORAL
  Filled 2017-08-01: qty 10

## 2017-08-01 MED ORDER — IPRATROPIUM-ALBUTEROL 0.5-2.5 (3) MG/3ML IN SOLN
3.0000 mL | Freq: Once | RESPIRATORY_TRACT | Status: AC
  Administered 2017-08-01: 3 mL via RESPIRATORY_TRACT
  Filled 2017-08-01: qty 3

## 2017-08-01 MED ORDER — MIDAZOLAM HCL 2 MG/2ML IJ SOLN: 1.0000 mg | INTRAMUSCULAR | Status: DC | PRN

## 2017-08-01 MED ORDER — FENTANYL CITRATE (PF) 100 MCG/2ML IJ SOLN
INTRAMUSCULAR | Status: AC
  Administered 2017-08-01: 100 ug
  Filled 2017-08-01: qty 2

## 2017-08-01 MED ORDER — ONDANSETRON HCL 4 MG/2ML IJ SOLN
4.0000 mg | Freq: Four times a day (QID) | INTRAMUSCULAR | Status: DC | PRN
  Administered 2017-08-01: 4 mg via INTRAVENOUS
  Filled 2017-08-01: qty 2

## 2017-08-01 MED ORDER — ACETAMINOPHEN 650 MG RE SUPP: 650.0000 mg | Freq: Four times a day (QID) | RECTAL | Status: DC | PRN

## 2017-08-01 MED ORDER — SODIUM CHLORIDE 0.9 % IV SOLN: 1.0000 g | INTRAVENOUS | Status: DC

## 2017-08-01 MED ORDER — METHYLPREDNISOLONE SODIUM SUCC 40 MG IJ SOLR
40.0000 mg | Freq: Two times a day (BID) | INTRAMUSCULAR | Status: DC
  Administered 2017-08-01 – 2017-08-02 (×2): 40 mg via INTRAVENOUS
  Filled 2017-08-01 (×2): qty 1

## 2017-08-01 MED ORDER — VANCOMYCIN HCL IN DEXTROSE 1-5 GM/200ML-% IV SOLN: 1000.0000 mg | INTRAVENOUS | Status: DC

## 2017-08-01 MED ORDER — MIDAZOLAM HCL 2 MG/2ML IJ SOLN
INTRAMUSCULAR | Status: AC
  Filled 2017-08-01: qty 4

## 2017-08-01 MED ORDER — BACLOFEN 10 MG PO TABS: 10.0000 mg | ORAL_TABLET | Freq: Three times a day (TID) | ORAL | Status: DC | PRN

## 2017-08-01 MED ORDER — MIDAZOLAM HCL 2 MG/2ML IJ SOLN
1.0000 mg | INTRAMUSCULAR | Status: DC | PRN
  Administered 2017-08-02: 1 mg via INTRAVENOUS
  Filled 2017-08-01: qty 2

## 2017-08-01 MED ORDER — FAMOTIDINE IN NACL 20-0.9 MG/50ML-% IV SOLN
20.0000 mg | Freq: Two times a day (BID) | INTRAVENOUS | Status: DC
  Administered 2017-08-02 – 2017-08-03 (×4): 20 mg via INTRAVENOUS
  Filled 2017-08-01 (×3): qty 50

## 2017-08-01 MED ORDER — FENTANYL BOLUS VIA INFUSION
25.0000 ug | INTRAVENOUS | Status: DC | PRN
  Administered 2017-08-02 (×3): 25 ug via INTRAVENOUS
  Filled 2017-08-01: qty 25

## 2017-08-01 MED ORDER — SODIUM CHLORIDE 0.9 % IV SOLN
INTRAVENOUS | Status: DC | PRN
  Administered 2017-08-03: 1 mL/h via INTRA_ARTERIAL

## 2017-08-01 MED ORDER — GUAIFENESIN-DM 100-10 MG/5ML PO SYRP: 5.0000 mL | ORAL_SOLUTION | ORAL | Status: DC | PRN

## 2017-08-01 MED ORDER — ACETAMINOPHEN 325 MG PO TABS
650.0000 mg | ORAL_TABLET | Freq: Once | ORAL | Status: AC
  Administered 2017-08-01: 650 mg via ORAL
  Filled 2017-08-01: qty 2

## 2017-08-01 MED ORDER — MORPHINE SULFATE (PF) 2 MG/ML IV SOLN
INTRAVENOUS | Status: AC
  Filled 2017-08-01: qty 1

## 2017-08-01 MED ORDER — MORPHINE SULFATE (PF) 2 MG/ML IV SOLN
INTRAVENOUS | Status: AC
  Administered 2017-08-01: 1 mg via INTRAVENOUS
  Filled 2017-08-01: qty 1

## 2017-08-01 MED ORDER — MORPHINE SULFATE (PF) 2 MG/ML IV SOLN
2.0000 mg | INTRAVENOUS | Status: DC | PRN
  Administered 2017-08-01: 2 mg via INTRAVENOUS

## 2017-08-01 MED ORDER — DOCUSATE SODIUM 50 MG/5ML PO LIQD: 100.0000 mg | Freq: Two times a day (BID) | ORAL | Status: DC | PRN

## 2017-08-01 MED ORDER — MUPIROCIN 2 % EX OINT
1.0000 "application " | TOPICAL_OINTMENT | Freq: Two times a day (BID) | CUTANEOUS | Status: DC
  Administered 2017-08-01 – 2017-08-03 (×6): 1 via NASAL
  Filled 2017-08-01 (×2): qty 22

## 2017-08-01 MED ORDER — ARFORMOTEROL TARTRATE 15 MCG/2ML IN NEBU
15.0000 ug | INHALATION_SOLUTION | Freq: Two times a day (BID) | RESPIRATORY_TRACT | Status: DC
  Administered 2017-08-01 – 2017-08-03 (×5): 15 ug via RESPIRATORY_TRACT
  Filled 2017-08-01 (×5): qty 2

## 2017-08-01 MED ORDER — LORATADINE 10 MG PO TABS
10.0000 mg | ORAL_TABLET | Freq: Every day | ORAL | Status: DC
  Administered 2017-08-01: 10 mg via ORAL
  Filled 2017-08-01: qty 1

## 2017-08-01 NOTE — Consult Note (Addendum)
.. ..  Name: [name] [name] MRN: [id number] DOB: 1933/12/10    ADMISSION DATE:  07/28/2017 CONSULTATION DATE:  08/01/17  REFERRING MD :  Admitted under [employer] ( RN called E link)  CHIEF COMPLAINT:  SOB  BRIEF PATIENT DESCRIPTION:  84 yr old female with PMHx of emphysema ( oxygen dept at baseline- 3L), DLBCL s/p chem, HTN, NSCLC Status post left upper lobectomy with lymph node dissection in 2016 s/p chemo presents to [facility] with shortness of breath and was being managed by [facility] for multifocal pneumonia, suspected GI bleed and pancytopenia.  PCCM was called when pt went into Acute resp distress and could not tolerate NIPPV.   SIGNIFICANT EVENTS  Acute respiratory distress  STUDIES:  CT chest:  1. No evidence of significant pulmonary embolus although motion artifact limits evaluation of peripheral pulmonary arteries. 2. Motion artifact severely limits evaluation of the lungs. Patchy airspace disease is demonstrated throughout the right lung and in the left lower lung likely representing multifocal pneumonia. Edema is also possible. 3. Diffuse emphysematous changes in the lungs. 4. Aortic atherosclerosis.  Coronary artery calcification. 5. Diffuse heterogeneous bone sclerosis may be due to renal osteodystrophy, sickle cell changes, or metastatic disease. No change. 6. Right hilar lymph nodes seen previously are less well visualized due to motion artifact but appear to be decreased in size.  CXR: 1. Increased interstitial opacity bilaterally, suspect for acute interstitial inflammatory process or edema superimposed on underlying chronic interstitial disease. 2. Stable left pleural thickening or scar    HISTORY OF PRESENT ILLNESS:  (obtained from EMR, other providers and patient and patient's family)  84 yr old female with PMHx of emphysema (oxygen dept at baseline- 3L), DLBCL s/p chem, HTN, NSCLC Status post left upper lobectomy with lymph node dissection in 2016 s/p chemo presents to [facility] with  shortness of breath and was being managed by [facility] for multifocal pneumonia, suspected GI bleed and pancytopenia.   PCCM was called when pt went into Acute resp distress and could not tolerate NIPPV.  Upon my evaluation pt has increased work of breathing using accessory muscles still able to speak on a NRB and nasal cannula both at max Sat ranging between 80-86%  Pt states that she is not comfortable.  HR 120s BP 150 sys  RR 40s  PAST MEDICAL HISTORY :   has a past medical history of Allergic rhinitis, Anxiety, Arthritis, Cancer ([place]), COPD (chronic obstructive pulmonary disease) ([place]), Diabetes mellitus without complication ([place]), Dyspnea, Emphysema of lung ([place]), GERD (gastroesophageal reflux disease), History of positive PPD, untreated, HTN (hypertension), Palpitations, Primary cancer of left upper lobe of lung ([place]) (05/23/2014), Requires supplemental oxygen, and SOB (shortness of breath).  has a past surgical history that includes Vaginal hysterectomy (1966); Lung lobectomy (05/04/14); Video assisted thoracoscopy (vats)/ lobectomy (Left, 05/04/2014); Lead removal (Left, 05/04/2014); Node dissection (Left, 05/04/2014); Cataract extraction w/ intraocular lens  implant, bilateral; and Portacath placement (Left, 06/10/2017). Prior to Admission medications   Medication Sig Start Date End Date Taking? Authorizing Provider  allopurinol (ZYLOPRIM) 100 MG tablet TAKE ONE TABLET BY MOUTH TWICE A DAY 07/08/17  Yes Curcio, Roselie Awkward, NP  baclofen (LIORESAL) 10 MG tablet Take 1 tablet (10 mg total) by mouth every 8 (eight) hours as needed for muscle spasms. 07/22/17  Yes Curcio, Roselie Awkward, NP  esomeprazole (NEXIUM) 40 MG capsule Take 40 mg by mouth daily as needed.  09/18/15  Yes [provider]  Fluticasone-Umeclidin-Vilant (TRELEGY ELLIPTA) 100-62.5-25 MCG/INH AEPB Inhale 1 puff into the  lungs daily. 06/26/17  Yes Lauraine Rinne, NP  lidocaine-prilocaine (EMLA) cream Apply 1 application topically as needed.  05/28/17  Yes Curcio, Roselie Awkward, NP  loratadine (CLARITIN) 10 MG tablet Take 10 mg by mouth daily.   Yes [provider]  metoprolol tartrate (LOPRESSOR) 25 MG tablet Take 25 mg by mouth 2 (two) times daily.   Yes [provider]  montelukast (SINGULAIR) 10 MG tablet Take 1 tablet (10 mg total) by mouth at bedtime. 06/26/17  Yes Lauraine Rinne, NP  multivitamin-iron-minerals-folic acid (CENTRUM) chewable tablet Chew 1 tablet by mouth daily.   Yes [provider]  naproxen sodium (ALEVE) 220 MG tablet Take 220 mg by mouth daily as needed (pain).   Yes [provider]  predniSONE (DELTASONE) 50 MG tablet TAKE TWO TABLETS BY MOUTH FOR 5 DAYS WITH EACH CYCLE OF CHEMO 07/08/17  Yes Curcio, Roselie Awkward, NP  prochlorperazine (COMPAZINE) 10 MG tablet Take 1 tablet (10 mg total) by mouth every 6 (six) hours as needed for nausea or vomiting. 05/28/17  Yes Curcio, Roselie Awkward, NP  tiZANidine (ZANAFLEX) 2 MG tablet Take 1 tablet by mouth daily as needed. 05/15/17  Yes [provider]  VENTOLIN HFA 108 (90 Base) MCG/ACT inhaler Inhale 1-2 puffs into the lungs every 4 (four) hours as needed for shortness of breath. 06/26/17  Yes Lauraine Rinne, NP  ciprofloxacin (CIPRO) 500 MG tablet Take 1 tablet (500 mg total) by mouth 2 (two) times daily. Patient not taking: Reported on 08/01/2017 07/01/17   [name], MD  Fluticasone-Umeclidin-Vilant (TRELEGY ELLIPTA) 100-62.5-25 MCG/INH AEPB Inhale 1 puff into the lungs daily. Patient not taking: Reported on 07/30/2017 06/26/17   Lauraine Rinne, NP  oxyCODONE (OXY IR/ROXICODONE) 5 MG immediate release tablet Take 1 tablet (5 mg total) by mouth every 6 (six) hours as needed for moderate pain or severe pain. Patient not taking: Reported on 07/20/2017 06/10/17   [name], MD   Allergies  Allergen Reactions  . Lipitor [Atorvastatin] Other (See Comments)    myalgia's  . Darvon [Propoxyphene Hcl]     UNSPECIFIED REACTION      FAMILY HISTORY:  family history includes Angina in her maternal grandfather; Cervical cancer in her mother; Heart disease in her father; Lung cancer in her brother; Prostate cancer in her father and paternal uncle; Stroke in her maternal grandmother. SOCIAL HISTORY:  reports that she quit smoking about 3 years ago. Her smoking use included cigarettes. She started smoking about 72 years ago. She has a 52.50 pack-year smoking history. She has quit using smokeless tobacco. She reports that she does not drink alcohol or use drugs.  REVIEW OF SYSTEMS:  ( bolded items are pertinent positives) Constitutional: Negative for fever, chills, weight loss, malaise/fatigue and diaphoresis.  HENT: Negative for hearing loss, ear pain, nosebleeds, congestion, sore throat, neck pain, tinnitus and ear discharge.   Eyes: Negative for blurred vision, double vision, photophobia, pain, discharge and redness.  Respiratory: Negative for cough, hemoptysis, sputum production, shortness of breath, wheezing and stridor.   Cardiovascular: Negative for chest pain, palpitations, orthopnea, claudication, leg swelling and PND.  Gastrointestinal: Negative for heartburn, nausea, vomiting, abdominal pain, diarrhea, constipation, blood in stool and melena.  Genitourinary: Negative for dysuria, urgency, frequency, hematuria and flank pain.  Musculoskeletal: Negative for myalgias, back pain, joint pain and falls.  Skin: Negative for itching and rash.  Neurological: Negative for dizziness, tingling, tremors, sensory change, speech change, focal weakness, seizures, loss of consciousness,  weakness and headaches.  Endo/Heme/Allergies: Negative for environmental allergies and polydipsia. Does not bruise/bleed easily.  SUBJECTIVE:   VITAL SIGNS: Temp:  [97.2 F (36.2 C)-99.5 F (37.5 C)] 98.1 F (36.7 C) (06/22 1921) Pulse Rate:  [95-129] 125 (06/22 2011) Resp:  [19-37] 34 (06/22 2011) BP: (103-137)/(50-97) 137/97 (06/22  1600) SpO2:  [84 %-100 %] 99 % (06/22 2011) FiO2 (%):  [70 %] 70 % (06/22 1949)  PHYSICAL EXAMINATION: General:  Well nourished female in resp distress Neuro:  GCS 15 AAOx3 no focal deficits HEENT:  NCAT, NRB and nasal cannula in place Cardiovascular:  S1 and S2 appreciated tachy, no appreciable murmur Lungs:  Coarse breath sounds bilaterally  Abdomen:  Soft obese non tender abdomen not distended Musculoskeletal:  Port a cath in left anterior chest  Skin:  Grossly intact  Recent Labs  Lab 07/29/17 1011 07/17/2017 2017  NA 140 138  K 4.0 3.2*  CL 108 105  CO2 24 21*  BUN 16 18  CREATININE 0.90 1.17*  GLUCOSE 169* 293*   Recent Labs  Lab 07/29/17 1011 07/17/2017 2017 08/01/17 1553  HGB 8.2* 7.6* 7.7*  HCT 25.1* 22.4* 23.3*  WBC 0.7* 4.1  --   PLT 141* 80*  --    Ct Angio Chest Pe W/cm &/or Wo Cm  Result Date: 07/28/2017 CLINICAL DATA:  Shortness of breath. History of lung cancer with partial left lobectomy. General malaise since chemotherapy on Wednesday. EXAM: CT ANGIOGRAPHY CHEST WITH CONTRAST TECHNIQUE: Multidetector CT imaging of the chest was performed using the standard protocol during bolus administration of intravenous contrast. Multiplanar CT image reconstructions and MIPs were obtained to evaluate the vascular anatomy. CONTRAST:  168mL ISOVUE-370 IOPAMIDOL (ISOVUE-370) INJECTION 76% COMPARISON:  05/01/2017 FINDINGS: Cardiovascular: Good opacification of the central, motion artifact limits examination but there is moderately good opacification of the central and segmental pulmonary arteries. No focal filling defects demonstrated in the visualized vessels suggesting no evidence of significant central pulmonary embolus. Normal caliber thoracic aorta. Calcification of aorta and coronary arteries. Great vessel origins are patent. Normal heart size. No pericardial effusion. Left central venous catheter with tip in the mid SVC region. Mediastinum/Nodes: Right hilar lymph nodes seen  previously are less well visualized today due to motion artifact but appear to be decreasing in size. This suggests response to chemotherapy. Right pretracheal nodes measuring up to 9 mm in diameter are unchanged. Esophagus is decompressed. Lungs/Pleura: Evaluation is severely limited by motion artifact. There is evidence of diffuse emphysematous change throughout both lungs but more prominent on the right. There is interval development of patchy airspace disease throughout the right lung and in the left lower lung. This is likely to represent pneumonia but could also indicate edema. Left upper lobectomy. Airways are grossly patent. Upper Abdomen: No acute process demonstrated on limited imaging of the upper abdomen. Musculoskeletal: Degenerative changes in the spine. Diffuse heterogeneous bone sclerosis may indicate metabolic change, renal osteodystrophy, sickle cell changes, or metastatic disease. No change since prior study. Review of the MIP images confirms the above findings. IMPRESSION: 1. No evidence of significant pulmonary embolus although motion artifact limits evaluation of peripheral pulmonary arteries. 2. Motion artifact severely limits evaluation of the lungs. Patchy airspace disease is demonstrated throughout the right lung and in the left lower lung likely representing multifocal pneumonia. Edema is also possible. 3. Diffuse emphysematous changes in the lungs. 4. Aortic atherosclerosis.  Coronary artery calcification. 5. Diffuse heterogeneous bone sclerosis may be due to renal osteodystrophy, sickle cell changes,  or metastatic disease. No change. 6. Right hilar lymph nodes seen previously are less well visualized due to motion artifact but appear to be decreased in size. Aortic Atherosclerosis (ICD10-I70.0) and Emphysema (ICD10-J43.9). Electronically Signed   By: Lucienne Capers M.D.   On: 07/26/2017 22:46   Dg Chest Port 1 View  Result Date: 07/26/2017 CLINICAL DATA:  Shortness of breath EXAM:  PORTABLE CHEST 1 VIEW COMPARISON:  06/08/2017, CT chest 05/01/2017 FINDINGS: Left-sided central venous port tip overlies the SVC. Diffuse increased interstitial opacity compared to prior suspect for acute interstitial edema or inflammatory process on underlying chronic disease. Right hilar/infrahilar opacity, likely corresponding to adenopathy noted on prior. Aortic atherosclerosis. No pneumothorax. Mild left pleural thickening or scar IMPRESSION: 1. Increased interstitial opacity bilaterally, suspect for acute interstitial inflammatory process or edema superimposed on underlying chronic interstitial disease. 2. Stable left pleural thickening or scar. Electronically Signed   By: Donavan Foil M.D.   On: 08/08/2017 20:43    ASSESSMENT / PLAN: NEURO: Prior to intubation no neurological deficits AAOx4 GCS 15 Received Morphine 1mg  for resp difficulty Post intubation- Sedation started Continuous Fentanyl ggt prn Versed RASS goal -1 to -2  CARDIAC: Hemodynamically stable Not currently on Vasopressors Last Lactate 2.8 from prior 6.89 ECHO in April 2019: LVEF 60-65% [id number] Normal LV systolic function; mild diastolic dysfunction; mild MR;   global longitudinal strain -20.9%. Marland Kitchen.Cardiac Panel (last 3 results) Recent Labs    08/01/17 0942 08/01/17 1553  TROPONINI 0.22* 0.17*  continue to Trend troponins  PULMONARY: Acute on Chronic Hypoxic and Hypercapnic Respiratory Failure Intubated No evidence of a PE on recent CTA CXR post intubation - reviewed Worsening bilat infiltrates- recent transfusion of 1 unit PRBC + IVF Pulmonary edema most likely vs. Acute Lung Injury from transfusion Mechanical ventilation TV 8 cc/kg - 400cc If A-a gradient is decreased and pt requires higher PEEP to maintain PO2 >60 and O2 sat >92% Switch to ARDS protocol TV 6cc/kg  At time of this note ABG pending- A line being placed Pt had a small amt of aspiration during intubation.- suctioned quickly from  oropharynx Started on Pepcid IV HOB >30 degrees VAP ? Multilobar PNA- on empiric abx PCT 1.23--> >0.5 (will continue abx) Check resp cx and RVP Reviewed previous imaging and current Given her h/o chemotherapy->CT chest more suggestive of pneumonitis superimposed on emphysematous lungs Continue on bronchodilators Q 6 Continue solumedrol Based on ABG-if pt meets parameters start SBT/wean in AM  ID: Based on last CBC ANC 2665  Not currently neutropenic Sepsis: PCT >0.5- continue abx MRSA + nares Any positive results F/u blood cx on 6/21 RVP and Resp cx pending Vanc and Zosyn- anti-pseudomonal coverage given h/o neutropenia and Vancomycin given MRSA nare + and hospitalization history  Endocrine: DM Last BG 293 Start on ICU Phase 1 glycemic protocol adjust for BMI and Cr Check TSH and Cortiosl level  GI: NPO OGT placed post intubation- low int suction-> gravity Small amt of aspiration furing intubation On GI ppx No TF for now ? GI bleed-  Monitor CBC watch OGT output and observe for melena Also if pt has any acute drop in BP consider stat CBC Will switch Pepcid to PPI if needed   Heme: Anemia of Chronic Disease S/p 1 unit prbc transfusion since admission Hgb<7 transfuse PRBCs .Jenetta Downer POS Performed at [facility] [place] [address], [place], [place] [address] ? GI bleed- continue Monitor CBC Q 12, watch oGT output and melena Will switch Pepcid to  PPI if needed DVT PPx-> SCDs and Lovenox ( if GFR decreases further switch to heparin)  RENAL Acute Kidney Injury Lab Results  Component Value Date   CREATININE 1.17 (H) 07/14/2017   CREATININE 0.90 07/29/2017   CREATININE 0.96 07/22/2017  medications pt prev. On include Allopurinol  Acute rise in Creatinine noted Avoid nephrotoxic meds if possible Check Vanc level Check uric acid levels and CK Non Anion Gap with Anion Gap Metabolic Acidosis LA trending down on last check Awaiting ABG post intubation  and line placement to assess further Goal for electrolytes K+ 4, Mg 2 Phos 2,  Continue with indwelling foley catheter- strict Is and Os     I, Dr Seward Carol have personally reviewed patient's available data, including medical history, events of note, physical examination and test results as part of my evaluation. I have discussed with other care providers such as RT, RN and Elink. The patient is critically ill with multiple organ systems failure and requires high complexity decision making for assessment and support, frequent evaluation and titration of therapies, application of advanced monitoring technologies and extensive interpretation of multiple databases. Critical Care Time devoted to patient care services described in this note is 75 Minutes. This time reflects time of care of this signee Dr Seward Carol. This critical care time does not reflect procedure time, or teaching time or supervisory time but could involve care discussion time    DISPOSITION: ICU CODE STATUS: FULL CC TIME: 75 mins PROGNOSIS: Guarded FAMILY: children at bedside. Prior to intubation I had an extensive conversation with the patient and her family.  She wanted more time but does not want to be sustained by machines if her prognosis worsens. She is willing to try intubation with the hopes of being extubated. She wanted a guarantee that that would happen. I explained to her that I could not guarantee she would be liberated from the vent given the extent of her lung injury. She understood prior to intubation. Family was present for the conversation.   Signed Dr Seward Carol Pulmonary Critical Care Locums  08/01/2017, 10:43 PM

## 2017-08-01 NOTE — Progress Notes (Signed)
CRITICAL VALUE ALERT  Critical Value:  Lactic Acid  Date & Time Notied:  08/01/17 1019  Provider Notified: Dr. Riccardo Dubin Arrien  Orders Received/Actions taken:

## 2017-08-01 NOTE — Progress Notes (Signed)
Pt with increase in work of breathing. Sats 87 on heated high flow nasal canula 70% 40L/min. Pt states she feels tired. Arrien MD paged to make aware. Awaiting return call. RT made aware of pts increase in work of  Breathing. Will continue to monitor.

## 2017-08-01 NOTE — Progress Notes (Signed)
RT placed patient ob BIPAP. Patient did not tolerate BIPAP so RT took patient off BIPAP and placed her back on a non rebreather and the heated high flow.RT wuill continue to [place]

## 2017-08-01 NOTE — ED Notes (Signed)
ED TO INPATIENT HANDOFF REPORT  Name/Age/Gender [name] [name] 84 y.o. female  Code Status    Code Status Orders  (From admission, onward)        Start     Ordered   08/01/17 0211  Full code  Continuous     08/01/17 0214    Code Status History    Date Active Date Inactive Code Status Order ID Comments User Context   05/04/2014 1458 05/10/2014 1337 Full Code [id number]  John Giovanni, PA-C Inpatient      Home/SNF/Other Home  Chief Complaint Resp Distress (Lung Cancer)  Level of Care/Admitting Diagnosis ED Disposition    ED Disposition Condition Comment   [facility] Area: [facility] [[id number]]  Level of Care: Stepdown [14]  Admit to SDU based on following criteria: Respiratory Distress:  Frequent assessment and/or intervention to maintain adequate ventilation/respiration, pulmonary toilet, and respiratory treatment.  Admit to SDU based on following criteria: Hemodynamic compromise or significant risk of instability:  Patient requiring short term acute titration and management of vasoactive drips, and invasive monitoring (i.e., CVP and Arterial line).  Diagnosis: Acute respiratory failure ([place]) [518.81.ICD-9-CM]  Admitting Physician: Norval Morton [[id number]]  Attending Physician: Norval Morton [[id number]]  Estimated length of stay: 3 - 4 days  Certification:: I certify this patient will need inpatient services for at least 2 midnights  PT Class (Do Not Modify): Inpatient [101]  PT Acc Code (Do Not Modify): Private [1]       Medical History Past Medical History:  Diagnosis Date  . Allergic rhinitis   . Anxiety   . Arthritis   . Cancer ([place])   . COPD (chronic obstructive pulmonary disease) ([place])   . Diabetes mellitus without complication (HCC)    no meds     . Dyspnea   . Emphysema of lung ([place])   . GERD (gastroesophageal reflux disease)   . History of positive PPD, untreated   . HTN (hypertension)   . Palpitations   . Primary cancer  of left upper lobe of lung ([place]) 05/23/2014   Stage IIA- T2b,N0- thoracoscopic left upper lobectomy 05/04/14  . Requires supplemental oxygen    3 liters  prn  . SOB (shortness of breath)     Allergies Allergies  Allergen Reactions  . Lipitor [Atorvastatin] Other (See Comments)    myalgia's  . Darvon [Propoxyphene Hcl]     UNSPECIFIED REACTION     IV Location/Drains/Wounds Patient Lines/Drains/Airways Status   Active Line/Drains/Airways    Name:   Placement date:   Placement time:   Site:   Days:   Implanted Port 06/10/17 Left Chest   06/10/17    1521    Chest   52   Peripheral IV 08/21/14 Left Antecubital   08/21/14    1331    Antecubital   1076   Peripheral IV 11/20/14 Left Antecubital   11/20/14    1235    Antecubital   985   Peripheral IV 09/26/15 Left Antecubital   09/26/15    1310    Antecubital   675   Peripheral IV 03/31/16 Left Antecubital   03/31/16    1038    Antecubital   488   Incision (Closed) 05/04/14 Chest Left   05/04/14    1125     1185   Incision (Closed) 05/20/17 Other (Comment) Right   05/20/17    1335     73   Incision (Closed) 06/10/17 Chest Left  06/10/17    1521     52          Labs/Imaging Results for orders placed or performed during the hospital encounter of 08/01/2017 (from the past 48 hour(s))  Urinalysis, Routine w reflex microscopic     Status: Abnormal   Collection Time: 07/29/2017  8:17 PM  Result Value Ref Range   Color, Urine YELLOW YELLOW   APPearance CLOUDY (A) CLEAR   Specific Gravity, Urine 1.038 (H) 1.005 - 1.030   pH 5.0 5.0 - 8.0   Glucose, UA NEGATIVE NEGATIVE mg/dL   Hgb urine dipstick SMALL (A) NEGATIVE   Bilirubin Urine NEGATIVE NEGATIVE   Ketones, ur NEGATIVE NEGATIVE mg/dL   Protein, ur 30 (A) NEGATIVE mg/dL   Nitrite NEGATIVE NEGATIVE   Leukocytes, UA LARGE (A) NEGATIVE   RBC / HPF 6-10 0 - 5 RBC/hpf   WBC, UA >50 (H) 0 - 5 WBC/hpf   Bacteria, UA MANY (A) NONE SEEN   Squamous Epithelial / LPF 0-5 0 - 5   WBC Clumps  PRESENT    Mucus PRESENT    Hyaline Casts, UA PRESENT    Non Squamous Epithelial 0-5 (A) NONE SEEN    Comment: Performed at [facility], [place] [address] [place], [place] [address]  Comprehensive metabolic panel     Status: Abnormal   Collection Time: 07/19/2017  8:17 PM  Result Value Ref Range   Sodium 138 135 - 145 mmol/L   Potassium 3.2 (L) 3.5 - 5.1 mmol/L   Chloride 105 101 - 111 mmol/L   CO2 21 (L) 22 - 32 mmol/L   Glucose, Bld 293 (H) 65 - 99 mg/dL   BUN 18 6 - 20 mg/dL   Creatinine, Ser 1.17 (H) 0.44 - 1.00 mg/dL   Calcium 9.0 8.9 - 10.3 mg/dL   Total Protein 6.5 6.5 - 8.1 g/dL   Albumin 2.6 (L) 3.5 - 5.0 g/dL   AST 26 15 - 41 U/L   ALT 21 14 - 54 U/L   Alkaline Phosphatase 95 38 - 126 U/L   Total Bilirubin 1.1 0.3 - 1.2 mg/dL   GFR calc non Af Amer 43 (L) >60 mL/min   GFR calc Af Amer 49 (L) >60 mL/min    Comment: (NOTE) The eGFR has been calculated using the CKD EPI equation. This calculation has not been validated in all clinical situations. eGFR's persistently <60 mL/min signify possible Chronic Kidney Disease.    Anion gap 12 5 - 15    Comment: Performed at [facility], [place] [address], [place], [place] [address]  CBC with Differential     Status: Abnormal   Collection Time: 08/02/2017  8:17 PM  Result Value Ref Range   WBC 4.1 4.0 - 10.5 K/uL   RBC 2.69 (L) 3.87 - 5.11 MIL/uL   Hemoglobin 7.6 (L) 12.0 - 15.0 g/dL   HCT 22.4 (L) 36.0 - 46.0 %   MCV 83.3 78.0 - 100.0 fL   MCH 28.3 26.0 - 34.0 pg   MCHC 33.9 30.0 - 36.0 g/dL   RDW 16.8 (H) 11.5 - 15.5 %   Platelets 80 (L) 150 - 400 K/uL    Comment: SPECIMEN CHECKED FOR CLOTS REPEATED TO VERIFY PLATELET COUNT CONFIRMED BY SMEAR    Neutrophils Relative % 54 %   Lymphocytes Relative 30 %   Monocytes Relative 2 %   Eosinophils Relative 0 %   Basophils Relative 1 %   Band Neutrophils  11 %   Myelocytes 2 %   nRBC 2 (H) 0 /100 WBC   Neutro Abs 2.8 1.7 - 7.7 K/uL   Lymphs  Abs 1.2 0.7 - 4.0 K/uL   Monocytes Absolute 0.1 0.1 - 1.0 K/uL   Eosinophils Absolute 0.0 0.0 - 0.7 K/uL   Basophils Absolute 0.0 0.0 - 0.1 K/uL   RBC Morphology RARE NRBCs     Comment: POLYCHROMASIA PRESENT TEARDROP CELLS    WBC Morphology MILD LEFT SHIFT (1-5% METAS, OCC MYELO, OCC BANDS)     Comment: DOHLE BODIES Performed at [place] [address] [place], [place] [address]   I-stat troponin, ED     Status: Abnormal   Collection Time: 08/02/2017  8:44 PM  Result Value Ref Range   Troponin i, poc 0.11 (HH) 0.00 - 0.08 ng/mL   Comment NOTIFIED PHYSICIAN    Comment 3            Comment: Due to the release kinetics of cTnI, a negative result within the first hours of the onset of symptoms does not rule out myocardial infarction with certainty. If myocardial infarction is still suspected, repeat the test at appropriate intervals.   I-Stat CG4 Lactic Acid, ED     Status: Abnormal   Collection Time: 07/11/2017  8:47 PM  Result Value Ref Range   Lactic Acid, Venous 4.40 (HH) 0.5 - 1.9 mmol/L   Comment NOTIFIED PHYSICIAN   Brain natriuretic peptide     Status: Abnormal   Collection Time: 07/30/2017 10:20 PM  Result Value Ref Range   B Natriuretic Peptide 326.6 (H) 0.0 - 100.0 pg/mL    Comment: Performed at [facility], [place] [address], [place], [place] [address]  I-Stat CG4 Lactic Acid, ED     Status: Abnormal   Collection Time: 08/06/2017 10:30 PM  Result Value Ref Range   Lactic Acid, Venous 6.89 (HH) 0.5 - 1.9 mmol/L   Comment NOTIFIED PHYSICIAN   Type and screen [place]     Status: None (Preliminary result)   Collection Time: 08/01/17  1:27 AM  Result Value Ref Range   ABO/RH(D) O POS    Antibody Screen NEG    Sample Expiration 09/03/17    Unit Number [id number]    Blood Component Type RED CELLS,LR    Unit division 00    Status of Unit ALLOCATED    Transfusion Status OK TO TRANSFUSE    Crossmatch Result       Compatible Performed at [facility], [place] [address], [place], [place] [address]    Unit Number [id number]    Blood Component Type RBC LR PHER1    Unit division 00    Status of Unit ALLOCATED    Transfusion Status OK TO TRANSFUSE    Crossmatch Result Compatible   ABO/Rh     Status: None (Preliminary result)   Collection Time: 08/01/17  1:30 AM  Result Value Ref Range   ABO/RH(D)      O POS Performed at [facility], [place] [address] [place], [place] [address]   POC occult blood, ED RN will collect     Status: Abnormal   Collection Time: 08/01/17  1:39 AM  Result Value Ref Range   Fecal Occult Bld POSITIVE (A) NEGATIVE  Prepare RBC     Status: None   Collection Time: 08/01/17  1:48 AM  Result Value Ref Range   Order Confirmation  ORDER PROCESSED BY BLOOD BANK Performed at [facility], [place] [address] [place], [place] [address]   Blood gas, arterial     Status: Abnormal   Collection Time: 08/01/17  2:30 AM  Result Value Ref Range   O2 Content 6.0 L/min   Delivery systems HEATED NASAL CANNULA    pH, Arterial 7.390 7.350 - 7.450   pCO2 arterial 25.6 (L) 32.0 - 48.0 mmHg   pO2, Arterial 56.3 (L) 83.0 - 108.0 mmHg   Bicarbonate 15.0 (L) 20.0 - 28.0 mmol/L   Acid-base deficit 8.6 (H) 0.0 - 2.0 mmol/L   O2 Saturation 82.8 %   Patient temperature 99.5    Collection site RIGHT RADIAL    Drawn by [phone]    Sample type ARTERIAL DRAW    Allens test (pass/fail) PASS PASS    Comment: Performed at [facility], [place] [address], [place], [place] [address]   Ct Angio Chest Pe W/cm &/or Wo Cm  Result Date: 07/19/2017 CLINICAL DATA:  Shortness of breath. History of lung cancer with partial left lobectomy. General malaise since chemotherapy on Wednesday. EXAM: CT ANGIOGRAPHY CHEST WITH CONTRAST TECHNIQUE: Multidetector CT imaging of the chest was performed using the standard protocol during bolus  administration of intravenous contrast. Multiplanar CT image reconstructions and MIPs were obtained to evaluate the vascular anatomy. CONTRAST:  116m ISOVUE-370 IOPAMIDOL (ISOVUE-370) INJECTION 76% COMPARISON:  05/01/2017 FINDINGS: Cardiovascular: Good opacification of the central, motion artifact limits examination but there is moderately good opacification of the central and segmental pulmonary arteries. No focal filling defects demonstrated in the visualized vessels suggesting no evidence of significant central pulmonary embolus. Normal caliber thoracic aorta. Calcification of aorta and coronary arteries. Great vessel origins are patent. Normal heart size. No pericardial effusion. Left central venous catheter with tip in the mid SVC region. Mediastinum/Nodes: Right hilar lymph nodes seen previously are less well visualized today due to motion artifact but appear to be decreasing in size. This suggests response to chemotherapy. Right pretracheal nodes measuring up to 9 mm in diameter are unchanged. Esophagus is decompressed. Lungs/Pleura: Evaluation is severely limited by motion artifact. There is evidence of diffuse emphysematous change throughout both lungs but more prominent on the right. There is interval development of patchy airspace disease throughout the right lung and in the left lower lung. This is likely to represent pneumonia but could also indicate edema. Left upper lobectomy. Airways are grossly patent. Upper Abdomen: No acute process demonstrated on limited imaging of the upper abdomen. Musculoskeletal: Degenerative changes in the spine. Diffuse heterogeneous bone sclerosis may indicate metabolic change, renal osteodystrophy, sickle cell changes, or metastatic disease. No change since prior study. Review of the MIP images confirms the above findings. IMPRESSION: 1. No evidence of significant pulmonary embolus although motion artifact limits evaluation of peripheral pulmonary arteries. 2. Motion  artifact severely limits evaluation of the lungs. Patchy airspace disease is demonstrated throughout the right lung and in the left lower lung likely representing multifocal pneumonia. Edema is also possible. 3. Diffuse emphysematous changes in the lungs. 4. Aortic atherosclerosis.  Coronary artery calcification. 5. Diffuse heterogeneous bone sclerosis may be due to renal osteodystrophy, sickle cell changes, or metastatic disease. No change. 6. Right hilar lymph nodes seen previously are less well visualized due to motion artifact but appear to be decreased in size. Aortic Atherosclerosis (ICD10-I70.0) and Emphysema (ICD10-J43.9). Electronically Signed   By: WLucienne CapersM.D.   On: 08/05/2017 22:46   [facility] [address]  Result Date: 07/24/2017 CLINICAL DATA:  Shortness of breath EXAM: PORTABLE CHEST 1 VIEW COMPARISON:  06/08/2017, CT chest 05/01/2017 FINDINGS: Left-sided central venous port tip overlies the SVC. Diffuse increased interstitial opacity compared to prior suspect for acute interstitial edema or inflammatory process on underlying chronic disease. Right hilar/infrahilar opacity, likely corresponding to adenopathy noted on prior. Aortic atherosclerosis. No pneumothorax. Mild left pleural thickening or scar IMPRESSION: 1. Increased interstitial opacity bilaterally, suspect for acute interstitial inflammatory process or edema superimposed on underlying chronic interstitial disease. 2. Stable left pleural thickening or scar. Electronically Signed   By: Donavan Foil M.D.   On: 07/30/2017 20:43    Pending Labs Unresulted Labs (From admission, onward)   Start     Ordered   08/01/17 0500  CBC  Tomorrow morning,   R     08/01/17 0214   08/01/17 3329  Basic metabolic panel  Tomorrow morning,   R     08/01/17 0214   08/01/17 0214  APTT  Add-on,   R     08/01/17 0214   08/01/17 0214  Protime-INR  Add-on,   R     08/01/17 0214   08/01/17 0214  Troponin I  Now then every 6 hours,   R      08/01/17 0214   08/01/17 0213  Procalcitonin  Add-on,   R     08/01/17 0214   08/01/17 0213  Legionella Pneumophila Serogp 1 Ur Ag  Once,   R     08/01/17 0214   08/01/17 0213  Lactic acid, plasma  STAT Now then every 3 hours,   STAT     08/01/17 0214   08/01/17 0209  Culture, sputum-assessment  Once,   R    Question:  Patient immune status  Answer:  Immunocompromised   08/01/17 0214   08/01/17 0209  Gram stain  Once,   R    Question:  Patient immune status  Answer:  Immunocompromised   08/01/17 0214   08/01/17 0209  Strep pneumoniae urinary antigen  Once,   R     08/01/17 0214   08/01/17 0137  Urine culture  STAT,   STAT     08/01/17 0136   07/14/2017 2301  Blood Culture (routine x 2)  BLOOD CULTURE X 2,   STAT     07/16/2017 2302      Vitals/Pain Today's Vitals   08/01/17 0130 08/01/17 0200 08/01/17 0230 08/01/17 0259  BP: (!) 103/51 (!) 127/56 126/75   Pulse: (!) 122 (!) 127 (!) 126   Resp: (!) 34 (!) 30 (!) 30   Temp:      TempSrc:      SpO2: (!) 89% (!) 85% (!) 85% (!) 84%  Weight:      Height:      PainSc:        Isolation Precautions No active isolations  Medications Medications  iopamidol (ISOVUE-370) 76 % injection (has no administration in time range)  0.9 %  sodium chloride infusion (has no administration in time range)  arformoterol (BROVANA) nebulizer solution 15 mcg (has no administration in time range)  sodium chloride flush (NS) 0.9 % injection 3 mL (has no administration in time range)  ondansetron (ZOFRAN) tablet 4 mg (has no administration in time range)    Or  ondansetron (ZOFRAN) injection 4 mg (has no administration in time range)  acetaminophen (TYLENOL) tablet 650 mg (has no administration in time range)    Or  acetaminophen (TYLENOL) suppository  650 mg (has no administration in time range)  ipratropium-albuterol (DUONEB) 0.5-2.5 (3) MG/3ML nebulizer solution 3 mL (has no administration in time range)  potassium chloride SA (K-DUR,KLOR-CON) CR  tablet 40 mEq (has no administration in time range)  ipratropium-albuterol (DUONEB) 0.5-2.5 (3) MG/3ML nebulizer solution 3 mL (has no administration in time range)  methylPREDNISolone sodium succinate (SOLU-MEDROL) 125 mg/2 mL injection 60 mg (has no administration in time range)  iopamidol (ISOVUE-370) 76 % injection 100 mL (100 mLs Intravenous Contrast Given 07/26/2017 2155)  ceFEPIme (MAXIPIME) 1 g in sodium chloride 0.9 % 100 mL IVPB (0 g Intravenous Stopped 08/07/2017 2346)  vancomycin (VANCOCIN) 2,000 mg in sodium chloride 0.9 % 500 mL IVPB (0 mg Intravenous Stopped 08/01/17 0253)  sodium chloride 0.9 % bolus 1,000 mL (0 mLs Intravenous Stopped 08/01/17 0202)  sodium chloride 0.9 % bolus 1,000 mL (1,000 mLs Intravenous New Bag/Given 08/01/17 0017)  acetaminophen (TYLENOL) tablet 650 mg (650 mg Oral Given 08/01/17 0052)  ipratropium-albuterol (DUONEB) 0.5-2.5 (3) MG/3ML nebulizer solution 3 mL (3 mLs Nebulization Given 08/01/17 0259)    Mobility non-ambulatory

## 2017-08-01 NOTE — Procedures (Signed)
Intubation Procedure Note [name] [name] [id number] Jul 24, 1933  Procedure: Intubation Indications: Respiratory insufficiency  Procedure Details Consent: Risks of procedure as well as the alternatives and risks of each were explained to the (patient/caregiver).  Consent for procedure obtained. Time Out: Verified patient identification, verified procedure, site/side was marked, verified correct patient position, special equipment/implants available, medications/allergies/relevent history reviewed, required imaging and test results available.  Performed  Maximum sterile technique was used including gloves, hand hygiene and mask.  MAC and 4  ETT 7.5 cuffed at 25 at lip  Etomidate 20 Versed 4 Lidocaine 2% 5 cc Rocuronium 50  Evaluation Hemodynamic Status: BP stable throughout; O2 sats: transiently fell during during procedure Patient's Current Condition: stable Complications: No apparent complications Patient did tolerate procedure well. Chest X-ray ordered to verify placement.  CXR: pending.   [name] [name] 08/01/2017

## 2017-08-01 NOTE — ED Provider Notes (Signed)
[facility] DEPT Provider Note   CSN: [id number] Arrival date & time: 07/12/2017  1934     History   Chief Complaint No chief complaint on file.   HPI [name] [name] is a 84 y.o. female.  HPI Patient presents to the emergency department with increasing shortness of breath over the last 2 days.  The patient states that she did not take any medications prior to arrival for symptoms.  The patient states she is on 3 L of home oxygen which seemed did not be helping with the shortness of breath.  Patient states that she does not feel she is running a fever.  Patient states that she is been weaker since this last treatment.  The patient denies chest pain,  headache,blurred vision, neck pain, fever,  numbness, dizziness, anorexia, edema, abdominal pain, nausea, vomiting, diarrhea, rash, back pain, dysuria, hematemesis, bloody stool, near syncope, or syncope. Past Medical History:  Diagnosis Date  . Allergic rhinitis   . Anxiety   . Arthritis   . Cancer ([place])   . COPD (chronic obstructive pulmonary disease) ([place])   . Diabetes mellitus without complication (HCC)    no meds     . Dyspnea   . Emphysema of lung ([place])   . GERD (gastroesophageal reflux disease)   . History of positive PPD, untreated   . HTN (hypertension)   . Palpitations   . Primary cancer of left upper lobe of lung ([place]) 05/23/2014   Stage IIA- T2b,N0- thoracoscopic left upper lobectomy 05/04/14  . Requires supplemental oxygen    3 liters  prn  . SOB (shortness of breath)     Patient Active Problem List   Diagnosis Date Noted  . Anemia due to antineoplastic chemotherapy 07/22/2017  . UTI (urinary tract infection) 07/01/2017  . Port-A-Cath in place 06/24/2017  . 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 Goals of care, counseling/discussion 05/28/2017  . Encounter for antineoplastic chemotherapy 05/28/2017  . ILD (interstitial lung disease) ([place])  02/25/2017  . Chronic respiratory failure with hypoxia ([place]) 02/25/2017  . COPD, mild ([place]) 11/17/2016  . Pulmonary emphysema ([place]) 11/17/2016  . Allergic rhinitis 11/17/2016  . Primary cancer of left upper lobe of lung ([place]) 05/23/2014  . S/P lobectomy of lung 05/04/2014  . Bilateral carotid bruits 03/31/2014  . PAD (peripheral artery disease) ([place]) 03/31/2014  . Diabetes type 2, controlled ([place]) 03/31/2014  . Cigarette smoker 03/23/2014  . COPD GOLD I  03/22/2014  . Chest pain 03/22/2014    Past Surgical History:  Procedure Laterality Date  . CATARACT EXTRACTION W/ INTRAOCULAR LENS  IMPLANT, BILATERAL    . LEAD REMOVAL Left 05/04/2014   Procedure: CRYO INTERCOSTAL NERVE BLOCK;  Surgeon: [name], MD;  Location: [facility];  Service: Thoracic;  Laterality: Left;  . LUNG LOBECTOMY  05/04/14   left upper lobectomy  . NODE DISSECTION Left 05/04/2014   Procedure: NODE DISSECTION;  Surgeon: [name], MD;  Location: [facility];  Service: Thoracic;  Laterality: Left;  . PORTACATH PLACEMENT Left 06/10/2017   Procedure: INSERTION PORT-A-CATH;  Surgeon: [name], MD;  Location: [facility];  Service: Thoracic;  Laterality: Left;  Marland Kitchen VAGINAL HYSTERECTOMY  1966  . VIDEO ASSISTED THORACOSCOPY (VATS)/ LOBECTOMY Left 05/04/2014   Procedure: LEFT VIDEO ASSISTED THORACOSCOPY (VATS)/LEFT UPPER LOBECTOMY;  Surgeon: [name], MD;  Location: [facility];  Service: Thoracic;  Laterality: Left;     OB History   None  Home Medications    Prior to Admission medications   Medication Sig Start Date End Date Taking? Authorizing Provider  allopurinol (ZYLOPRIM) 100 MG tablet TAKE ONE TABLET BY MOUTH TWICE A DAY 07/08/17  Yes Curcio, Roselie Awkward, NP  baclofen (LIORESAL) 10 MG tablet Take 1 tablet (10 mg total) by mouth every 8 (eight) hours as needed for muscle spasms. 07/22/17  Yes Curcio, Roselie Awkward, NP  esomeprazole (NEXIUM) 40 MG capsule Take 40 mg by mouth daily as needed.   09/18/15  Yes [provider]  Fluticasone-Umeclidin-Vilant (TRELEGY ELLIPTA) 100-62.5-25 MCG/INH AEPB Inhale 1 puff into the lungs daily. 06/26/17  Yes Lauraine Rinne, NP  lidocaine-prilocaine (EMLA) cream Apply 1 application topically as needed. 05/28/17  Yes Curcio, Roselie Awkward, NP  loratadine (CLARITIN) 10 MG tablet Take 10 mg by mouth daily.   Yes [provider]  metoprolol tartrate (LOPRESSOR) 25 MG tablet Take 25 mg by mouth 2 (two) times daily.   Yes [provider]  montelukast (SINGULAIR) 10 MG tablet Take 1 tablet (10 mg total) by mouth at bedtime. 06/26/17  Yes Lauraine Rinne, NP  multivitamin-iron-minerals-folic acid (CENTRUM) chewable tablet Chew 1 tablet by mouth daily.   Yes [provider]  naproxen sodium (ALEVE) 220 MG tablet Take 220 mg by mouth daily as needed (pain).   Yes [provider]  predniSONE (DELTASONE) 50 MG tablet TAKE TWO TABLETS BY MOUTH FOR 5 DAYS WITH EACH CYCLE OF CHEMO 07/08/17  Yes Curcio, Roselie Awkward, NP  prochlorperazine (COMPAZINE) 10 MG tablet Take 1 tablet (10 mg total) by mouth every 6 (six) hours as needed for nausea or vomiting. 05/28/17  Yes Curcio, Roselie Awkward, NP  tiZANidine (ZANAFLEX) 2 MG tablet Take 1 tablet by mouth daily as needed. 05/15/17  Yes [provider]  VENTOLIN HFA 108 (90 Base) MCG/ACT inhaler Inhale 1-2 puffs into the lungs every 4 (four) hours as needed for shortness of breath. 06/26/17  Yes Lauraine Rinne, NP  ciprofloxacin (CIPRO) 500 MG tablet Take 1 tablet (500 mg total) by mouth 2 (two) times daily. Patient not taking: Reported on 07/30/2017 07/01/17   [name], MD  Fluticasone-Umeclidin-Vilant (TRELEGY ELLIPTA) 100-62.5-25 MCG/INH AEPB Inhale 1 puff into the lungs daily. Patient not taking: Reported on 07/13/2017 06/26/17   Lauraine Rinne, NP  oxyCODONE (OXY IR/ROXICODONE) 5 MG immediate release tablet Take 1 tablet (5 mg total) by mouth every 6 (six) hours as needed for moderate pain or  severe pain. Patient not taking: Reported on 07/14/2017 06/10/17   [name], MD    Family History Family History  Problem Relation Age of Onset  . Cervical cancer Mother   . Prostate cancer Father   . Heart disease Father   . Stroke Maternal Grandmother   . Angina Maternal Grandfather   . Prostate cancer Paternal Uncle   . Lung cancer Brother   . Lung disease Neg Hx     Social History Social History   Tobacco Use  . Smoking status: Former Smoker    Packs/day: 0.75    Years: 70.00    Pack years: 52.50    Types: Cigarettes    Start date: 01/02/1945    Last attempt to quit: 05/03/2014    Years since quitting: 3.2  . Smokeless tobacco: Former Systems developer  . Tobacco comment: Quit 05/03/14  Substance Use Topics  . Alcohol use: No    Alcohol/week: 0.0 oz  . Drug use: No  Allergies   Lipitor [atorvastatin] and Darvon [propoxyphene hcl]   Review of Systems Review of Systems All other systems negative except as documented in the HPI. All pertinent positives and negatives as reviewed in the HPI.  Physical Exam Updated Vital Signs BP (!) 103/50   Pulse (!) 127   Temp 99.8 F (37.7 C) (Oral)   Resp 19   Ht 5\' 1"  (1.549 m)   Wt 89.8 kg (198 lb)   SpO2 91%   BMI 37.41 kg/m   Physical Exam  Constitutional: She is oriented to person, place, and time. She appears well-developed and well-nourished. No distress.  HENT:  Head: Normocephalic and atraumatic.  Mouth/Throat: Oropharynx is clear and moist.  Eyes: Pupils are equal, round, and reactive to light.  Neck: Normal range of motion. Neck supple.  Cardiovascular: Normal rate, regular rhythm and normal heart sounds. Exam reveals no gallop and no friction rub.  No murmur heard. Pulmonary/Chest: Effort normal. No stridor. Tachypnea noted. No respiratory distress. She has no decreased breath sounds. She has no wheezes. She has rhonchi. She has no rales.  Abdominal: Soft. Bowel sounds are normal. She exhibits no  distension. There is no tenderness.  Neurological: She is alert and oriented to person, place, and time. She exhibits normal muscle tone. Coordination normal.  Skin: Skin is warm and dry. Capillary refill takes less than 2 seconds. No rash noted. No erythema.  Psychiatric: She has a normal mood and affect. Her behavior is normal.  Nursing note and vitals reviewed.    ED Treatments / Results  Labs (all labs ordered are listed, but only abnormal results are displayed) Labs Reviewed  COMPREHENSIVE METABOLIC PANEL - Abnormal; Notable for the following components:      Result Value   Potassium 3.2 (*)    CO2 21 (*)    Glucose, Bld 293 (*)    Creatinine, Ser 1.17 (*)    Albumin 2.6 (*)    GFR calc non Af Amer 43 (*)    GFR calc Af Amer 49 (*)    All other components within normal limits  CBC WITH DIFFERENTIAL/PLATELET - Abnormal; Notable for the following components:   RBC 2.69 (*)    Hemoglobin 7.6 (*)    HCT 22.4 (*)    RDW 16.8 (*)    Platelets 80 (*)    nRBC 2 (*)    All other components within normal limits  BRAIN NATRIURETIC PEPTIDE - Abnormal; Notable for the following components:   B Natriuretic Peptide 326.6 (*)    All other components within normal limits  I-STAT TROPONIN, ED - Abnormal; Notable for the following components:   Troponin i, poc 0.11 (*)    All other components within normal limits  I-STAT CG4 LACTIC ACID, ED - Abnormal; Notable for the following components:   Lactic Acid, Venous 4.40 (*)    All other components within normal limits  I-STAT CG4 LACTIC ACID, ED - Abnormal; Notable for the following components:   Lactic Acid, Venous 6.89 (*)    All other components within normal limits  CULTURE, BLOOD (ROUTINE X 2)  CULTURE, BLOOD (ROUTINE X 2)  URINALYSIS, ROUTINE W REFLEX MICROSCOPIC    EKG EKG Interpretation  Date/Time:  Friday July 31 2017 19:49:06 EDT Ventricular Rate:  140 PR Interval:    QRS Duration: 95 QT Interval:  301 QTC  Calculation: 460 R Axis:   0 Text Interpretation:  Sinus tachycardia Confirmed by Quintella Reichert [phone]) on 07/22/2017 8:15:51 PM  Radiology Ct Angio Chest Pe W/cm &/or Wo Cm  Result Date: 07/23/2017 CLINICAL DATA:  Shortness of breath. History of lung cancer with partial left lobectomy. General malaise since chemotherapy on Wednesday. EXAM: CT ANGIOGRAPHY CHEST WITH CONTRAST TECHNIQUE: Multidetector CT imaging of the chest was performed using the standard protocol during bolus administration of intravenous contrast. Multiplanar CT image reconstructions and MIPs were obtained to evaluate the vascular anatomy. CONTRAST:  117mL ISOVUE-370 IOPAMIDOL (ISOVUE-370) INJECTION 76% COMPARISON:  05/01/2017 FINDINGS: Cardiovascular: Good opacification of the central, motion artifact limits examination but there is moderately good opacification of the central and segmental pulmonary arteries. No focal filling defects demonstrated in the visualized vessels suggesting no evidence of significant central pulmonary embolus. Normal caliber thoracic aorta. Calcification of aorta and coronary arteries. Great vessel origins are patent. Normal heart size. No pericardial effusion. Left central venous catheter with tip in the mid SVC region. Mediastinum/Nodes: Right hilar lymph nodes seen previously are less well visualized today due to motion artifact but appear to be decreasing in size. This suggests response to chemotherapy. Right pretracheal nodes measuring up to 9 mm in diameter are unchanged. Esophagus is decompressed. Lungs/Pleura: Evaluation is severely limited by motion artifact. There is evidence of diffuse emphysematous change throughout both lungs but more prominent on the right. There is interval development of patchy airspace disease throughout the right lung and in the left lower lung. This is likely to represent pneumonia but could also indicate edema. Left upper lobectomy. Airways are grossly patent. Upper  Abdomen: No acute process demonstrated on limited imaging of the upper abdomen. Musculoskeletal: Degenerative changes in the spine. Diffuse heterogeneous bone sclerosis may indicate metabolic change, renal osteodystrophy, sickle cell changes, or metastatic disease. No change since prior study. Review of the MIP images confirms the above findings. IMPRESSION: 1. No evidence of significant pulmonary embolus although motion artifact limits evaluation of peripheral pulmonary arteries. 2. Motion artifact severely limits evaluation of the lungs. Patchy airspace disease is demonstrated throughout the right lung and in the left lower lung likely representing multifocal pneumonia. Edema is also possible. 3. Diffuse emphysematous changes in the lungs. 4. Aortic atherosclerosis.  Coronary artery calcification. 5. Diffuse heterogeneous bone sclerosis may be due to renal osteodystrophy, sickle cell changes, or metastatic disease. No change. 6. Right hilar lymph nodes seen previously are less well visualized due to motion artifact but appear to be decreased in size. Aortic Atherosclerosis (ICD10-I70.0) and Emphysema (ICD10-J43.9). Electronically Signed   By: Lucienne Capers M.D.   On: 08/03/2017 22:46   Dg Chest Port 1 View  Result Date: 07/19/2017 CLINICAL DATA:  Shortness of breath EXAM: PORTABLE CHEST 1 VIEW COMPARISON:  06/08/2017, CT chest 05/01/2017 FINDINGS: Left-sided central venous port tip overlies the SVC. Diffuse increased interstitial opacity compared to prior suspect for acute interstitial edema or inflammatory process on underlying chronic disease. Right hilar/infrahilar opacity, likely corresponding to adenopathy noted on prior. Aortic atherosclerosis. No pneumothorax. Mild left pleural thickening or scar IMPRESSION: 1. Increased interstitial opacity bilaterally, suspect for acute interstitial inflammatory process or edema superimposed on underlying chronic interstitial disease. 2. Stable left pleural  thickening or scar. Electronically Signed   By: Donavan Foil M.D.   On: 07/18/2017 20:43    Procedures Procedures (including critical care time)  Medications Ordered in ED Medications  iopamidol (ISOVUE-370) 76 % injection (has no administration in time range)  vancomycin (VANCOCIN) 2,000 mg in sodium chloride 0.9 % 500 mL IVPB (2,000 mg Intravenous New Bag/Given 07/30/2017 2349)  sodium chloride  0.9 % bolus 1,000 mL (has no administration in time range)  iopamidol (ISOVUE-370) 76 % injection 100 mL (100 mLs Intravenous Contrast Given 08/03/2017 2155)  ceFEPIme (MAXIPIME) 1 g in sodium chloride 0.9 % 100 mL IVPB (0 g Intravenous Stopped 08/02/2017 2346)  sodium chloride 0.9 % bolus 1,000 mL (1,000 mLs Intravenous New Bag/Given 07/21/2017 2239)     Initial Impression / Assessment and Plan / ED Course  I have reviewed the triage vital signs and the nursing notes.  Pertinent labs & imaging results that were available during my care of the patient were reviewed by me and considered in my medical decision making (see chart for details).     The patient is maintaining her oxygen saturations currently on 6 L of nasal cannula oxygen.  I did discuss the use of BiPAP with the patient and her family member and advised him that that may be necessary.  The patient has been mentating adequately here in the emergency department her blood pressures have improved the last one was 128/90.  Patient was initially somewhat hypotensive.  Started on IV antibiotics and fluid boluses.  Patient states that she does feel somewhat more comfortable since she is been in the emergency department.  Final Clinical Impressions(s) / ED Diagnoses   Final diagnoses:  None    ED Discharge Orders    None       Rebeca Allegra 08/01/17 0008    Quintella Reichert, MD 08/11/17 [phone]

## 2017-08-01 NOTE — Progress Notes (Signed)
Pharmacy Antibiotic Note  [name] [name] is a 84 y.o. female admitted on 08/02/2017 with pneumonia.  Pharmacy has been consulted for Vancomycin, cefepime dosing.  Plan: Cefepime 1gm iv q24hr  Vancomycin 2gm iv x1, then 1gm iv q48hr  Goal AUC = 400 - 500 for all indications, except meningitis (goal AUC > 500 and Cmin 15-20 mcg/mL)   Height: 5\' 1"  (154.9 cm) Weight: 198 lb (89.8 kg) IBW/kg (Calculated) : 47.8  Temp (24hrs), Avg:98.8 F (37.1 C), Min:97.9 F (36.6 C), Max:99.8 F (37.7 C)  Recent Labs  Lab 07/29/17 1011 07/21/2017 2017 07/15/2017 2047 08/06/2017 2230  WBC 0.7* 4.1  --   --   CREATININE 0.90 1.17*  --   --   LATICACIDVEN  --   --  4.40* 6.89*    Estimated Creatinine Clearance: 38.5 mL/min (A) (by C-G formula based on SCr of 1.17 mg/dL (H)).    Allergies  Allergen Reactions  . Lipitor [Atorvastatin] Other (See Comments)    myalgia's  . Darvon [Propoxyphene Hcl]     UNSPECIFIED REACTION     Antimicrobials this admission: Vancomycin 08/06/2017 >> Cefepime 07/28/2017 >>    Dose adjustments this admission: -  Microbiology results: -  Thank you for allowing pharmacy to be a part of this patient's care.  Nani Skillern [place] 08/01/2017 5:53 AM

## 2017-08-01 NOTE — H&P (Addendum)
History and Physical    [name] [name] GNF:[id number] DOB: August 24, 1933 DOA: 07/22/2017  Referring MD/NP/PA: Renie Ora PCP: Nolene Ebbs, MD  Patient coming from: home via EMS  Chief Complaint: Shortness of breath  I have personally briefly reviewed patient's old medical records in [place]   HPI: [name] [name] is a 84 y.o. female with medical history significant of lung cancer s/p a left lobectomy, diffuse large B-cell lymphoma currently receiving chemotherapy for, HTN, COPD, oxygen dependent on 3 L, diastolic dysfunction, DM type II; who presented with complaints of worsening shortness of breath and a dry cough.  Symptoms seem to progressively worsened over the last 2 to 3 days since her last chemotherapy treatment.  Associated symptoms include chest tightness, generalized malaise, intermittently has lower extremity swelling, and reports of possibly seeing blood with stools.  Patient does use NSAIDs from time to time.  Denies any significant  En route with EMS patient was noted to be hypoxic at 85% on home 3 L.  Patient was placed on nonrebreather mask w and given DuoNeb breathing treatment, 2 g magnesium sulfate.Neb breathing treatment, and 125 mg of Solu-Medrol IV.  ED Course: Admission patient was found to be afebrile, pulse 122-143, respirations 19-37, blood pressure 95/57-134/62,  and O2 saturations 84-97% on 6 L nasal cannula oxygen.  Labs revealed WBC 4.1, hemoglobin 7.6, potassium 3.2, troponin 0.11 lactic acid trended up to 6.89,  but reported patient not received any IV fluids at that time.  CT angiogram of the chest showed signs of multifocal pneumonia but no pulmonary embolus.  Patient was thereafter given 2 L of normal saline IV fluids and empiric antibiotics of vancomycin and cefepime.  TRH called to admit.  Review of Systems  Constitutional: Positive for malaise/fatigue. Negative for chills and fever.  HENT: Positive for congestion. Negative for ear discharge.     Eyes: Negative for photophobia and pain.  Respiratory: Positive for cough and shortness of breath.   Cardiovascular: Positive for leg swelling. Negative for chest pain.  Gastrointestinal: Positive for blood in stool. Negative for abdominal pain, nausea and vomiting.  Genitourinary: Negative for dysuria and hematuria.  Musculoskeletal: Positive for myalgias. Negative for falls.  Skin: Negative for rash.  Neurological: Positive for weakness. Negative for focal weakness.  Psychiatric/Behavioral: Negative for substance abuse and suicidal ideas.    Past Medical History:  Diagnosis Date  . Allergic rhinitis   . Anxiety   . Arthritis   . Cancer ([place])   . COPD (chronic obstructive pulmonary disease) ([place])   . Diabetes mellitus without complication (HCC)    no meds     . Dyspnea   . Emphysema of lung ([place])   . GERD (gastroesophageal reflux disease)   . History of positive PPD, untreated   . HTN (hypertension)   . Palpitations   . Primary cancer of left upper lobe of lung ([place]) 05/23/2014   Stage IIA- T2b,N0- thoracoscopic left upper lobectomy 05/04/14  . Requires supplemental oxygen    3 liters  prn  . SOB (shortness of breath)     Past Surgical History:  Procedure Laterality Date  . CATARACT EXTRACTION W/ INTRAOCULAR LENS  IMPLANT, BILATERAL    . LEAD REMOVAL Left 05/04/2014   Procedure: CRYO INTERCOSTAL NERVE BLOCK;  Surgeon: [name], MD;  Location: [facility];  Service: Thoracic;  Laterality: Left;  . LUNG LOBECTOMY  05/04/14   left upper lobectomy  . NODE DISSECTION Left 05/04/2014   Procedure: NODE DISSECTION;  Surgeon:  [name], MD;  Location: [facility];  Service: Thoracic;  Laterality: Left;  . PORTACATH PLACEMENT Left 06/10/2017   Procedure: INSERTION PORT-A-CATH;  Surgeon: [name], MD;  Location: [facility];  Service: Thoracic;  Laterality: Left;  [facility] VAGINAL HYSTERECTOMY  1966  . VIDEO ASSISTED THORACOSCOPY (VATS)/ LOBECTOMY Left 05/04/2014   Procedure:  LEFT VIDEO ASSISTED THORACOSCOPY (VATS)/LEFT UPPER LOBECTOMY;  Surgeon: [name], MD;  Location: [facility];  Service: Thoracic;  Laterality: Left;     reports that she quit smoking about 3 years ago. Her smoking use included cigarettes. She started smoking about 72 years ago. She has a 52.50 pack-year smoking history. She has quit using smokeless tobacco. She reports that she does not drink alcohol or use drugs.  Allergies  Allergen Reactions  . Lipitor [Atorvastatin] Other (See Comments)    myalgia's  . Darvon [Propoxyphene Hcl]     UNSPECIFIED REACTION     Family History  Problem Relation Age of Onset  . Cervical cancer Mother   . Prostate cancer Father   . Heart disease Father   . Stroke Maternal Grandmother   . Angina Maternal Grandfather   . Prostate cancer Paternal Uncle   . Lung cancer Brother   . Lung disease Neg Hx     Prior to Admission medications   Medication Sig Start Date End Date Taking? Authorizing Provider  allopurinol (ZYLOPRIM) 100 MG tablet TAKE ONE TABLET BY MOUTH TWICE A DAY 07/08/17  Yes Curcio, Roselie Awkward, NP  baclofen (LIORESAL) 10 MG tablet Take 1 tablet (10 mg total) by mouth every 8 (eight) hours as needed for muscle spasms. 07/22/17  Yes Curcio, Roselie Awkward, NP  esomeprazole (NEXIUM) 40 MG capsule Take 40 mg by mouth daily as needed.  09/18/15  Yes [provider]  Fluticasone-Umeclidin-Vilant (TRELEGY ELLIPTA) 100-62.5-25 MCG/INH AEPB Inhale 1 puff into the lungs daily. 06/26/17  Yes Lauraine Rinne, NP  lidocaine-prilocaine (EMLA) cream Apply 1 application topically as needed. 05/28/17  Yes Curcio, Roselie Awkward, NP  loratadine (CLARITIN) 10 MG tablet Take 10 mg by mouth daily.   Yes [provider]  metoprolol tartrate (LOPRESSOR) 25 MG tablet Take 25 mg by mouth 2 (two) times daily.   Yes [provider]  montelukast (SINGULAIR) 10 MG tablet Take 1 tablet (10 mg total) by mouth at bedtime. 06/26/17  Yes Lauraine Rinne, NP    multivitamin-iron-minerals-folic acid (CENTRUM) chewable tablet Chew 1 tablet by mouth daily.   Yes [provider]  naproxen sodium (ALEVE) 220 MG tablet Take 220 mg by mouth daily as needed (pain).   Yes [provider]  predniSONE (DELTASONE) 50 MG tablet TAKE TWO TABLETS BY MOUTH FOR 5 DAYS WITH EACH CYCLE OF CHEMO 07/08/17  Yes Curcio, Roselie Awkward, NP  prochlorperazine (COMPAZINE) 10 MG tablet Take 1 tablet (10 mg total) by mouth every 6 (six) hours as needed for nausea or vomiting. 05/28/17  Yes Curcio, Roselie Awkward, NP  tiZANidine (ZANAFLEX) 2 MG tablet Take 1 tablet by mouth daily as needed. 05/15/17  Yes [provider]  VENTOLIN HFA 108 (90 Base) MCG/ACT inhaler Inhale 1-2 puffs into the lungs every 4 (four) hours as needed for shortness of breath. 06/26/17  Yes Lauraine Rinne, NP  ciprofloxacin (CIPRO) 500 MG tablet Take 1 tablet (500 mg total) by mouth 2 (two) times daily. Patient not taking: Reported on 07/26/2017 07/01/17   [name], MD  Fluticasone-Umeclidin-Vilant (TRELEGY ELLIPTA) 100-62.5-25 MCG/INH AEPB Inhale  1 puff into the lungs daily. Patient not taking: Reported on 08/06/2017 06/26/17   Lauraine Rinne, NP  oxyCODONE (OXY IR/ROXICODONE) 5 MG immediate release tablet Take 1 tablet (5 mg total) by mouth every 6 (six) hours as needed for moderate pain or severe pain. Patient not taking: Reported on 07/20/2017 06/10/17   [name], MD    Physical Exam:  Constitutional: Obese elderly female who appears to be in moderate respiratory distress Vitals:   08/01/17 0030 08/01/17 0056 08/01/17 0100 08/01/17 0130  BP: 114/63  134/62 (!) 103/51  Pulse: (!) 127  (!) 129 (!) 122  Resp: (!) 24  (!) 28 (!) 34  Temp:  99.5 F (37.5 C)    TempSrc:  Rectal    SpO2: (!) 84%  (!) 85% (!) 89%  Weight:      Height:       Eyes: PERRL, lids and conjunctivae normal ENMT: Mucous membranes are dry posterior pharynx clear of any exudate or lesions. Neck: normal,  supple, no masses, no thyromegaly Respiratory: Patient is tachypneic and moderate respiratory distress with decreased aeration.  Can appreciate some crackles at the bases and intermittent rhonchi.  Patient only able to talk and 2-3 word sentences at this time and has some accessory muscle usage. Cardiovascular: Tachycardic, no murmurs / rubs / gallops. No extremity edema. 2+ pedal pulses. No carotid bruits.  Port-A-Cath of the upper chest wall. Abdomen: no tenderness, no masses palpated. No hepatosplenomegaly. Bowel sounds positive.  Musculoskeletal: no clubbing / cyanosis. No joint deformity upper and lower extremities. Good ROM, no contractures. Normal muscle tone.  Skin: no rashes, lesions, ulcers. No induration Neurologic: CN 2-12 grossly intact. Sensation intact, DTR normal. Strength 5/5 in all 4.  Psychiatric: Normal judgment and insight. Alert and oriented x 3. Normal mood.     Labs on Admission: I have personally reviewed following labs and imaging studies  CBC: Recent Labs  Lab 07/29/17 1011 08/09/2017 2017  WBC 0.7* 4.1  NEUTROABS 0.4* 2.8  HGB 8.2* 7.6*  HCT 25.1* 22.4*  MCV 84.4 83.3  PLT 141* 80*   Basic Metabolic Panel: Recent Labs  Lab 07/29/17 1011 07/26/2017 2017  NA 140 138  K 4.0 3.2*  CL 108 105  CO2 24 21*  GLUCOSE 169* 293*  BUN 16 18  CREATININE 0.90 1.17*  CALCIUM 9.9 9.0   GFR: Estimated Creatinine Clearance: 38.5 mL/min (A) (by C-G formula based on SCr of 1.17 mg/dL (H)). Liver Function Tests: Recent Labs  Lab 07/29/17 1011 08/05/2017 2017  AST 11 26  ALT 8 21  ALKPHOS 93 95  BILITOT 0.7 1.1  PROT 6.3* 6.5  ALBUMIN 2.8* 2.6*   No results for input(s): LIPASE, AMYLASE in the last 168 hours. No results for input(s): AMMONIA in the last 168 hours. Coagulation Profile: No results for input(s): INR, PROTIME in the last 168 hours. Cardiac Enzymes: No results for input(s): CKTOTAL, CKMB, CKMBINDEX, TROPONINI in the last 168 hours. BNP (last 3  results) No results for input(s): PROBNP in the last 8760 hours. HbA1C: No results for input(s): HGBA1C in the last 72 hours. CBG: No results for input(s): GLUCAP in the last 168 hours. Lipid Profile: No results for input(s): CHOL, HDL, LDLCALC, TRIG, CHOLHDL, LDLDIRECT in the last 72 hours. Thyroid Function Tests: No results for input(s): TSH, T4TOTAL, FREET4, T3FREE, THYROIDAB in the last 72 hours. Anemia Panel: No results for input(s): VITAMINB12, FOLATE, FERRITIN, TIBC, IRON, RETICCTPCT in the last 72 hours. Urine  analysis:    Component Value Date/Time   COLORURINE YELLOW 08/08/2017 2017   APPEARANCEUR CLOUDY (A) 07/25/2017 2017   LABSPEC 1.038 (H) 07/26/2017 2017   PHURINE 5.0 08/03/2017 2017   GLUCOSEU NEGATIVE 08/05/2017 2017   HGBUR SMALL (A) 07/29/2017 2017   BILIRUBINUR NEGATIVE 07/29/2017 2017   [place] 07/12/2017 2017   PROTEINUR 30 (A) 07/23/2017 2017   UROBILINOGEN 0.2 05/02/2014 1310   NITRITE NEGATIVE 07/23/2017 2017   LEUKOCYTESUR LARGE (A) 07/21/2017 2017   Sepsis Labs: No results found for this or any previous visit (from the past 240 hour(s)).   Radiological Exams on Admission: Ct Angio Chest Pe W/cm &/or Wo Cm  Result Date: 08/08/2017 CLINICAL DATA:  Shortness of breath. History of lung cancer with partial left lobectomy. General malaise since chemotherapy on Wednesday. EXAM: CT ANGIOGRAPHY CHEST WITH CONTRAST TECHNIQUE: Multidetector CT imaging of the chest was performed using the standard protocol during bolus administration of intravenous contrast. Multiplanar CT image reconstructions and MIPs were obtained to evaluate the vascular anatomy. CONTRAST:  154mL ISOVUE-370 IOPAMIDOL (ISOVUE-370) INJECTION 76% COMPARISON:  05/01/2017 FINDINGS: Cardiovascular: Good opacification of the central, motion artifact limits examination but there is moderately good opacification of the central and segmental pulmonary arteries. No focal filling defects  demonstrated in the visualized vessels suggesting no evidence of significant central pulmonary embolus. Normal caliber thoracic aorta. Calcification of aorta and coronary arteries. Great vessel origins are patent. Normal heart size. No pericardial effusion. Left central venous catheter with tip in the mid SVC region. Mediastinum/Nodes: Right hilar lymph nodes seen previously are less well visualized today due to motion artifact but appear to be decreasing in size. This suggests response to chemotherapy. Right pretracheal nodes measuring up to 9 mm in diameter are unchanged. Esophagus is decompressed. Lungs/Pleura: Evaluation is severely limited by motion artifact. There is evidence of diffuse emphysematous change throughout both lungs but more prominent on the right. There is interval development of patchy airspace disease throughout the right lung and in the left lower lung. This is likely to represent pneumonia but could also indicate edema. Left upper lobectomy. Airways are grossly patent. Upper Abdomen: No acute process demonstrated on limited imaging of the upper abdomen. Musculoskeletal: Degenerative changes in the spine. Diffuse heterogeneous bone sclerosis may indicate metabolic change, renal osteodystrophy, sickle cell changes, or metastatic disease. No change since prior study. Review of the MIP images confirms the above findings. IMPRESSION: 1. No evidence of significant pulmonary embolus although motion artifact limits evaluation of peripheral pulmonary arteries. 2. Motion artifact severely limits evaluation of the lungs. Patchy airspace disease is demonstrated throughout the right lung and in the left lower lung likely representing multifocal pneumonia. Edema is also possible. 3. Diffuse emphysematous changes in the lungs. 4. Aortic atherosclerosis.  Coronary artery calcification. 5. Diffuse heterogeneous bone sclerosis may be due to renal osteodystrophy, sickle cell changes, or metastatic disease. No  change. 6. Right hilar lymph nodes seen previously are less well visualized due to motion artifact but appear to be decreased in size. Aortic Atherosclerosis (ICD10-I70.0) and Emphysema (ICD10-J43.9). Electronically Signed   By: Lucienne Capers M.D.   On: 07/12/2017 22:46   Dg Chest Port 1 View  Result Date: 08/06/2017 CLINICAL DATA:  Shortness of breath EXAM: PORTABLE CHEST 1 VIEW COMPARISON:  06/08/2017, CT chest 05/01/2017 FINDINGS: Left-sided central venous port tip overlies the SVC. Diffuse increased interstitial opacity compared to prior suspect for acute interstitial edema or inflammatory process on underlying chronic disease. Right hilar/infrahilar opacity, likely  corresponding to adenopathy noted on prior. Aortic atherosclerosis. No pneumothorax. Mild left pleural thickening or scar IMPRESSION: 1. Increased interstitial opacity bilaterally, suspect for acute interstitial inflammatory process or edema superimposed on underlying chronic interstitial disease. 2. Stable left pleural thickening or scar. Electronically Signed   By: Donavan Foil M.D.   On: 07/24/2017 20:43    EKG: Independently reviewed.  Sinus tachycardia at 140 bpm  Assessment/Plan Acute on chronic respiratory failure with hypoxia, COPD exacerbation, sepsis 2/2 multifocal pneumonia: Patient presents with worsening shortness of breath and dry cough.  Found to have O2 saturations as low as 84% on home 3 L of nasal cannula oxygen.  Labs revealed WBC 4.1, but lactic acid elevated to 6.89.  CT angiogram of the chest shows some multifocal pneumonia, but no signs of a pulmonary embolism.  Patient given 125 mg Solu-Medrol, 2 g of magnesium sulfate, and breathing treatments en route with EMS.  Lactic acid was reportedly before fluids.  Patient received 2 L of normal saline IV fluids and empiric antibiotics of vancomycin and cefepime. - Admit to stepdown bed - Follow-up blood and sputum cultures - Add on procalcitonin - Continuous pulse  oximetry with nasal cannula oxygen continuously - BiPAP as needed  - check stat ABG - DuoNeb QID and prn shortness of breath/wheezing - Brovana and budesonide nebs  - Solu-Medrol IV - Continue Singulair, Claritin - Continue empiric antibiotics of vancomycin and cefepime  Suspected GI bleed, acute blood loss anemia: Patient admits to seeing some blood with stool and intermittent use of Aleve.  She was noted to have guaiac positive stools with hemoglobin initially 7.6.  Patient was typed and screened for possible need of blood products. - Transfuse 1 unit of packed red blood cells - may warrant consult GI in a.m. if symptoms persist  Elevated troponin: Acute.  Initial troponin elevated 0.11 suspect this is secondary to acute respiratory distress.  Patient denies any chest pain complaints - Continue to trend troponin  Pancytopenia: Platelet count noted to be 80 on admission with hemoglobin down to 7.6.  This could be related with recent chemotherapy treatments, but patient also reports bleeding as seen above. - Continue to monitor  Lung cancer s/p partial left lobectomy, large B-cell lymphoma: Currently undergoing chemotherapy for large B-cell lymphoma with Dr. Earlie Server. - Notify Dr. Earlie Server in a.m. that the patient is admitted to the hospital  Diabetes mellitus type 2: Patient currently not on any medications diabetic medications with her last hemoglobin A1c noted to be 6.7 on 06/08/2017.  Patient's initial blood glucose however was elevated up to 293, but suspect likely steroid-induced. - Hypoglycemic protocol - CBGs every 4 hours with sensitive sliding scale insulin - Adjust regimen as needed  Diastolic CHF: Last EF 60 to 65% with grade 1 diastolic dysfunction. - Strict I&Os and daily weights  Essential hypertension - Continue metoprolol  GERD - Substitution of Protonix 40 mg twice daily  DVT prophylaxis: SCDs Code Status: Full Family Communication: Discussed plan of care with  the patient family present bedside Disposition Plan: Possible discharge home once medically stable Consults called: None Admission status: Inpatient  Norval Morton MD [facility] Pager [phone]   If 7PM-7AM, please contact night-coverage [email or web address] Password [facility]  08/01/2017, 1:57 AM

## 2017-08-01 NOTE — ED Provider Notes (Signed)
1:11 AM Patient signed out to me at shift change.  Patient with history of non-small cell lung cancer, diffuse large B-cell lymphoma, presented to emergency department complaint of shortness of breath.  Last chemotherapy was 2 days ago.  Patient was found to be tachycardic and hypoxic.  Code sepsis was called, patient is receiving IV fluids and antibiotics.  It was noted that patient's lactic acid actually increased from the last, according to the prior provider, patient apparently did not receive much fluid in between 2 lactic acids.  She is receiving IV fluids and antibiotics at this time.  Patient is not requiring BiPAP.  Pt was signed out to me pending hospitalist call back.   I spoke with hospitalist, Dr. Tamala Julian, who asked me to call critical care.  He will admit patient.  Asked me to type and screen the patient.   I went and spoke with the patient, she states she is feeling better, she continues to be tachycardic, maintaining oxygen saturation above 90% on 6L oxygen at this time, normally on 3L at home.  He is able to speak 3-4 word sentences.  She denies any pain.    She confirms that she is full code  She does appear to be very pale.  I will send type and screen. She denies black or dark stools. States has had diarrhea over last two days but thinks its from chemo  1:36 AM Spoke with Critical care, Dr. Tamala Julian, who reviewed pt's chart and aware of pt and will keep on his radar.  At this time, BP remains stable and normal. HR in 120s.    Vitals:   08/01/17 0018 08/01/17 0030 08/01/17 0056 08/01/17 0100  BP: 120/64 114/63  134/62  Pulse: (!) 126 (!) 127  (!) 129  Resp: (!) 35 (!) 24  (!) 28  Temp:   99.5 F (37.5 C)   TempSrc:   Rectal   SpO2: (!) 86% (!) 84%  (!) 85%  Weight:      Height:        1:47 AM Hemoccult positive. Will transfuse two units. Pt updated.    Vitals:   08/01/17 0259 08/01/17 0400 08/01/17 0413 08/01/17 0427  BP:  126/67 (!) 134/54   Pulse:  (!) 123 (!)  123 (!) 117  Resp:  (!) 25 (!) 37 (!) 27  Temp:   97.9 F (36.6 C)   TempSrc:   Oral   SpO2: (!) 84% 96% 96% 94%  Weight:      Height:          Jeannett Senior, PA-C 08/01/17 0441    [place], Jenny Reichmann, MD 08/01/17 [phone]

## 2017-08-01 NOTE — Progress Notes (Signed)
PROGRESS NOTE    [name] [name]  EXB:[id number] DOB: [birth year] DOA: 08/07/2017 PCP: Nolene Ebbs, MD    Brief Narrative:  84 yo female who presented with dyspnea. She does have the past medical history of lung cancer status post left lobectomy, diffuse large B-cell lymphoma currently on chemotherapy, hypertension, COPD with chronic hypoxic respiratory failure (3LPM), diastolic heart failure and type 2 diabetes mellitus.  Patient reported worsening dyspnea for the last 2 to 3 days, dry cough, chest tightness, generalized malaise and lower extremity edema.  Due to persistent symptoms EMS was called, she was found hypoxic with an oxygen saturation of 85% on 3 L of supplemental oxygen per minute.  On the initial physical examination she had a heart rate of 122-143, respiratory rate 19-37, blood pressure 95/57, 134/62, oxygen saturation 84 to 97% on 6 L/min of supplemental oxygen per nasal cannula.  Dry mucous membranes, increase respiratory effort, decreased air movements, rales and rhonchi at bases, heart S1-S2 present, tachycardic, abdomen was soft nontender, no significant lower extremity edema.  Arterial blood gas 7.39/25.6/55/15/82.8%.  Sodium 138, potassium 3.2, chloride 105, bicarb 21, glucose 293, BUN 18, creatinine 1.17, troponin 0.11, lactic acid (venous) 4.4, urine analysis specific gravity 1.038, 30 protein, greater than 50 white cells, positive hyaline casts.  Fecal occult positive.  Chest x-ray, right rotation with increased insertional markings bilaterally, no frank infiltrate.  CT chest with significant emphysematous changes, right upper lobe infiltrate and bilateral lower lobe infiltrates.  EKG sinus tachycardia, normal axis, normal intervals.  Patient was admitted to the hospital with a working diagnosis of sepsis due to multilobar pneumonia implicated by acute on chronic hypoxic respiratory failure.  Assessment & Plan:   Active Problems:   COPD GOLD I    Diabetes type 2, controlled  ([place])   Diffuse large B-cell lymphoma of intrathoracic lymph nodes (HCC)   Acute on chronic respiratory failure with hypoxia (HCC)   Sepsis due to pneumonia [facility])   Essential hypertension   GI bleed   Acute blood loss anemia   1. Sepsis due to multilobar pneumonia. Patient with multilobar pneumonia, will continue antibiotic therapy with ceftriaxone and azithromycin for community acquired pneumonia. Patient is immunosuppressed due to chemotherapy, but no low suspicion for multidrug resistant bacteria causing pneumonia. Considering MRSA pcr is positive will have a low threshold to resume antistaphylococcal therapy. Continue to follow cultures, cell count and temperature curve. Check urinary antigen for Strep Pneumo and Legionella. Sputum culture. Continue steroids due to severe pneumonia.     2. Acute on chronic hypoxic respiratory failure in the setting of emphysema/ COPD exacerbation. Trial of high flow nasal cannula, target 02 saturation greater than 88%, may need non invasive mechanical ventilation if worsening work of breathing. Continue bronchodilator therapy with duoneb and systemic steroids.   3. AKI with hypokalemia. Suspected pre-renal due to sepsis, will add gentle hydration with LR, balanced electrolyte solution at 75 ml per hour, correct K with kcl 40 meq, follow renal panel in am, avoid hypotension and nephrotoxic medications.   3. HTN. Continue blood pressure monitoring, continue metoprolol.   4. Diffuse large B cell lymphoma with symptomatic anemia.  Pancytopenia sp chemotherapy, will continue to follow cell count in am. Patient is sp one unit prbc transfusion.  5. Pyuria. Follow on urine culture.   6. T2Dm. Continue insulin sliding scale for glucose cover and monitoring. Capillary glucose 320, 240. Will calculate insulin requirements before starting long acting/ basal insulin regimen.   DVT prophylaxis: enoxaparin   Code  Status:  full Family Communication: no family at the  bedside  Disposition Plan: pending recovery of respiratory failure  Patient is critically ill due to acute on chronic hypoxic respiratory failure high Fi02 requirements, will continue medical therapy to prevent imminent deterioration.   Critical care time 60 minutes.   Consultants:     Procedures:     Antimicrobials:   Ceftriaxone  Azithromycin     Subjective: Patient with persistent dyspnea, moderate in intensity, worse with movement, and improved with supplemental 02. No chest pain, no nausea or vomiting.   Objective: Vitals:   08/01/17 0413 08/01/17 0427 08/01/17 0442 08/01/17 0643  BP: (!) 134/54  124/70 130/75  Pulse: (!) 123 (!) 117 (!) 105 (!) 102  Resp: (!) 37 (!) 27 (!) 24 (!) 26  Temp: 97.9 F (36.6 C)  98.1 F (36.7 C) 97.6 F (36.4 C)  TempSrc: Oral  Oral Oral  SpO2: 96% 94% 95% 95%  Weight:      Height:        Intake/Output Summary (Last 24 hours) at 08/01/2017 0807 Last data filed at 08/01/2017 1610 Gross per 24 hour  Intake 2197 ml  Output 225 ml  Net 1972 ml   Filed Weights   07/12/2017 1951  Weight: 89.8 kg (198 lb)    Examination:   General: deconditioned and ill looking appearing  Neurology: Awake and alert, non focal  E ENT: positive pallor, no icterus, oral mucosa moist Cardiovascular: No JVD. S1-S2 present, rhythmic, no gallops, rubs, or murmurs. No lower extremity edema. Pulmonary: mild use of accessory muscles, positive breath sounds bilaterally, poor air movement, no wheezing, scattered rhonchi and rales. Gastrointestinal. Abdomen protuberant with no organomegaly, non tender, no rebound or guarding Skin. No rashes Musculoskeletal: no joint deformities     Data Reviewed: I have personally reviewed following labs and imaging studies  CBC: Recent Labs  Lab 07/29/17 1011 07/24/2017 2017  WBC 0.7* 4.1  NEUTROABS 0.4* 2.8  HGB 8.2* 7.6*  HCT 25.1* 22.4*  MCV 84.4 83.3  PLT 141* 80*   Basic Metabolic Panel: Recent Labs    Lab 07/29/17 1011 08/06/2017 2017  NA 140 138  K 4.0 3.2*  CL 108 105  CO2 24 21*  GLUCOSE 169* 293*  BUN 16 18  CREATININE 0.90 1.17*  CALCIUM 9.9 9.0   GFR: Estimated Creatinine Clearance: 38.5 mL/min (A) (by C-G formula based on SCr of 1.17 mg/dL (H)). Liver Function Tests: Recent Labs  Lab 07/29/17 1011 08/03/2017 2017  AST 11 26  ALT 8 21  ALKPHOS 93 95  BILITOT 0.7 1.1  PROT 6.3* 6.5  ALBUMIN 2.8* 2.6*   No results for input(s): LIPASE, AMYLASE in the last 168 hours. No results for input(s): AMMONIA in the last 168 hours. Coagulation Profile: No results for input(s): INR, PROTIME in the last 168 hours. Cardiac Enzymes: No results for input(s): CKTOTAL, CKMB, CKMBINDEX, TROPONINI in the last 168 hours. BNP (last 3 results) No results for input(s): PROBNP in the last 8760 hours. HbA1C: No results for input(s): HGBA1C in the last 72 hours. CBG: Recent Labs  Lab 08/01/17 0343  GLUCAP 320*   Lipid Profile: No results for input(s): CHOL, HDL, LDLCALC, TRIG, CHOLHDL, LDLDIRECT in the last 72 hours. Thyroid Function Tests: No results for input(s): TSH, T4TOTAL, FREET4, T3FREE, THYROIDAB in the last 72 hours. Anemia Panel: No results for input(s): VITAMINB12, FOLATE, FERRITIN, TIBC, IRON, RETICCTPCT in the last 72 hours.    Radiology Studies: I have  reviewed all of the imaging during this hospital visit personally     Scheduled Meds: . allopurinol  100 mg Oral BID  . arformoterol  15 mcg Nebulization BID  . Chlorhexidine Gluconate Cloth  6 each Topical Q0600  . feeding supplement (ENSURE ENLIVE)  237 mL Oral BID BM  . guaiFENesin  600 mg Oral BID  . insulin aspart  0-9 Units Subcutaneous Q4H  . ipratropium-albuterol  3 mL Nebulization QID  . loratadine  10 mg Oral Daily  . methylPREDNISolone (SOLU-MEDROL) injection  60 mg Intravenous Q8H  . metoprolol tartrate  25 mg Oral BID  . montelukast  10 mg Oral QHS  . mupirocin ointment  1 application Nasal BID   . pantoprazole  40 mg Oral BID  . sodium chloride flush  3 mL Intravenous Q12H   Continuous Infusions: . ceFEPime (MAXIPIME) IV    . [START ON 08/03/2017] vancomycin       LOS: 0 days        Emir Nack Gerome Apley, MD [facility] Pager [phone]

## 2017-08-01 NOTE — Progress Notes (Addendum)
CRITICAL VALUE ALERT  Critical Value:  Troponin 0.22  Date & Time Notied:  1039  Provider Notified: Dr. Riccardo Dubin Arrien  Orders Received/Actions taken: Orders placed by MD.

## 2017-08-02 ENCOUNTER — Inpatient Hospital Stay (HOSPITAL_COMMUNITY): Payer: Medicare HMO

## 2017-08-02 ENCOUNTER — Other Ambulatory Visit (HOSPITAL_COMMUNITY): Payer: Medicare HMO

## 2017-08-02 LAB — CBC WITH DIFFERENTIAL/PLATELET
BASOS ABS: 0 10*3/uL (ref 0.0–0.1)
Band Neutrophils: 7 %
Band Neutrophils: 15 %
Basophils Absolute: 0 10*3/uL (ref 0.0–0.1)
Basophils Relative: 0 %
Basophils Relative: 0 %
EOS ABS: 0 10*3/uL (ref 0.0–0.7)
EOS PCT: 0 %
Eosinophils Absolute: 0 10*3/uL (ref 0.0–0.7)
Eosinophils Relative: 0 %
HCT: 23.1 % — ABNORMAL LOW (ref 36.0–46.0)
HEMATOCRIT: 24.4 % — AB (ref 36.0–46.0)
HEMOGLOBIN: 8.1 g/dL — AB (ref 12.0–15.0)
Hemoglobin: 7.8 g/dL — ABNORMAL LOW (ref 12.0–15.0)
LYMPHS ABS: 1.2 10*3/uL (ref 0.7–4.0)
Lymphocytes Relative: 3 %
Lymphocytes Relative: 8 %
Lymphs Abs: 0.5 10*3/uL — ABNORMAL LOW (ref 0.7–4.0)
MCH: 28.3 pg (ref 26.0–34.0)
MCH: 27.8 pg (ref 26.0–34.0)
MCHC: 33.8 g/dL (ref 30.0–36.0)
MCHC: 33.2 g/dL (ref 30.0–36.0)
MCV: 83.8 fL (ref 78.0–100.0)
MCV: 83.7 fL (ref 78.0–100.0)
METAMYELOCYTES PCT: 3 %
METAMYELOCYTES PCT: 5 %
MONOS PCT: 4 %
MYELOCYTES: 2 %
Monocytes Absolute: 0.5 10*3/uL (ref 0.1–1.0)
Monocytes Absolute: 0.6 10*3/uL (ref 0.1–1.0)
Monocytes Relative: 3 %
Myelocytes: 4 %
NEUTROS ABS: 14.4 10*3/uL — AB (ref 1.7–7.7)
NEUTROS PCT: 66 %
NRBC: 7 /100{WBCs} — AB
Neutro Abs: 12.8 10*3/uL — ABNORMAL HIGH (ref 1.7–7.7)
Neutrophils Relative %: 80 %
PLATELETS: 74 10*3/uL — AB (ref 150–400)
Platelets: 79 10*3/uL — ABNORMAL LOW (ref 150–400)
RBC: 2.76 MIL/uL — AB (ref 3.87–5.11)
RBC: 2.91 MIL/uL — AB (ref 3.87–5.11)
RDW: 17.4 % — ABNORMAL HIGH (ref 11.5–15.5)
RDW: 17.7 % — AB (ref 11.5–15.5)
WBC: 14.6 10*3/uL — AB (ref 4.0–10.5)
WBC: 15.4 10*3/uL — AB (ref 4.0–10.5)
nRBC: 14 /100 WBC — ABNORMAL HIGH

## 2017-08-02 LAB — BLOOD GAS, ARTERIAL
ACID-BASE DEFICIT: 9.7 mmol/L — AB (ref 0.0–2.0)
Acid-base deficit: 6.2 mmol/L — ABNORMAL HIGH (ref 0.0–2.0)
Bicarbonate: 17.8 mmol/L — ABNORMAL LOW (ref 20.0–28.0)
Bicarbonate: 19.9 mmol/L — ABNORMAL LOW (ref 20.0–28.0)
Drawn by: 11249
Drawn by: 225631
FIO2: 100
FIO2: 90
LHR: 20 {breaths}/min
LHR: 28 {breaths}/min
MECHVT: 400 mL
MECHVT: 400 mL
O2 SAT: 95.7 %
O2 Saturation: 84.4 %
PATIENT TEMPERATURE: 37.3
PATIENT TEMPERATURE: 98.8
PEEP: 8 cmH2O
PEEP: 10 cmH2O
PH ART: 7.198 — AB (ref 7.350–7.450)
PH ART: 7.253 — AB (ref 7.350–7.450)
PO2 ART: 63.9 mmHg — AB (ref 83.0–108.0)
PO2 ART: 88.3 mmHg (ref 83.0–108.0)
pCO2 arterial: 47.9 mmHg (ref 32.0–48.0)
pCO2 arterial: 46.8 mmHg (ref 32.0–48.0)

## 2017-08-02 LAB — RESPIRATORY PANEL BY PCR
Adenovirus: NOT DETECTED
BORDETELLA PERTUSSIS-RVPCR: NOT DETECTED
CHLAMYDOPHILA PNEUMONIAE-RVPPCR: NOT DETECTED
CORONAVIRUS 229E-RVPPCR: NOT DETECTED
CORONAVIRUS HKU1-RVPPCR: NOT DETECTED
CORONAVIRUS NL63-RVPPCR: NOT DETECTED
Coronavirus OC43: NOT DETECTED
Influenza A: NOT DETECTED
Influenza B: NOT DETECTED
MYCOPLASMA PNEUMONIAE-RVPPCR: NOT DETECTED
Metapneumovirus: NOT DETECTED
Parainfluenza Virus 1: NOT DETECTED
Parainfluenza Virus 2: NOT DETECTED
Parainfluenza Virus 3: NOT DETECTED
Parainfluenza Virus 4: NOT DETECTED
Respiratory Syncytial Virus: NOT DETECTED
Rhinovirus / Enterovirus: NOT DETECTED

## 2017-08-02 LAB — BASIC METABOLIC PANEL
Anion gap: 8 (ref 5–15)
BUN: 22 mg/dL — ABNORMAL HIGH (ref 6–20)
CALCIUM: 8.3 mg/dL — AB (ref 8.9–10.3)
CO2: 20 mmol/L — AB (ref 22–32)
CREATININE: 1.31 mg/dL — AB (ref 0.44–1.00)
Chloride: 114 mmol/L — ABNORMAL HIGH (ref 101–111)
GFR, EST AFRICAN AMERICAN: 43 mL/min — AB (ref >60)
GFR, EST NON AFRICAN AMERICAN: 37 mL/min — AB (ref >60)
Glucose, Bld: 219 mg/dL — ABNORMAL HIGH (ref 65–99)
Potassium: 4.7 mmol/L (ref 3.5–5.1)
SODIUM: 142 mmol/L (ref 135–145)

## 2017-08-02 LAB — URIC ACID: Uric Acid, Serum: 4.3 mg/dL (ref 2.3–6.6)

## 2017-08-02 LAB — GLUCOSE, CAPILLARY
GLUCOSE-CAPILLARY: 249 mg/dL — AB (ref 65–99)
GLUCOSE-CAPILLARY: 172 mg/dL — AB (ref 65–99)
GLUCOSE-CAPILLARY: 252 mg/dL — AB (ref 65–99)
GLUCOSE-CAPILLARY: 381 mg/dL — AB (ref 65–99)
Glucose-Capillary: 333 mg/dL — ABNORMAL HIGH (ref 65–99)
Glucose-Capillary: 380 mg/dL — ABNORMAL HIGH (ref 65–99)
Glucose-Capillary: 385 mg/dL — ABNORMAL HIGH (ref 65–99)

## 2017-08-02 LAB — COMPREHENSIVE METABOLIC PANEL
ALBUMIN: 2.6 g/dL — AB (ref 3.5–5.0)
ALK PHOS: 120 U/L (ref 38–126)
ALT: 42 U/L (ref 14–54)
ANION GAP: 7 (ref 5–15)
AST: 62 U/L — ABNORMAL HIGH (ref 15–41)
BILIRUBIN TOTAL: 0.6 mg/dL (ref 0.3–1.2)
BUN: 21 mg/dL — ABNORMAL HIGH (ref 6–20)
CO2: 20 mmol/L — ABNORMAL LOW (ref 22–32)
Calcium: 8.5 mg/dL — ABNORMAL LOW (ref 8.9–10.3)
Chloride: 115 mmol/L — ABNORMAL HIGH (ref 101–111)
Creatinine, Ser: 1.19 mg/dL — ABNORMAL HIGH (ref 0.44–1.00)
GFR calc non Af Amer: 42 mL/min — ABNORMAL LOW (ref >60)
GFR, EST AFRICAN AMERICAN: 48 mL/min — AB (ref >60)
GLUCOSE: 282 mg/dL — AB (ref 65–99)
Potassium: 5 mmol/L (ref 3.5–5.1)
Sodium: 142 mmol/L (ref 135–145)
Total Protein: 6.4 g/dL — ABNORMAL LOW (ref 6.5–8.1)

## 2017-08-02 LAB — HEMOGLOBIN AND HEMATOCRIT, BLOOD
HCT: 22.9 % — ABNORMAL LOW (ref 36.0–46.0)
Hemoglobin: 7.5 g/dL — ABNORMAL LOW (ref 12.0–15.0)

## 2017-08-02 LAB — LACTIC ACID, PLASMA
LACTIC ACID, VENOUS: 3.2 mmol/L — AB (ref 0.5–1.9)
Lactic Acid, Venous: 1.9 mmol/L (ref 0.5–1.9)

## 2017-08-02 LAB — PHOSPHORUS: Phosphorus: 3.9 mg/dL (ref 2.5–4.6)

## 2017-08-02 LAB — MAGNESIUM: Magnesium: 2.3 mg/dL (ref 1.7–2.4)

## 2017-08-02 LAB — CK: CK TOTAL: 104 U/L (ref 38–234)

## 2017-08-02 LAB — TRIGLYCERIDES: TRIGLYCERIDES: 283 mg/dL — AB (ref <150)

## 2017-08-02 LAB — PROCALCITONIN: PROCALCITONIN: 1.52 ng/mL

## 2017-08-02 MED ORDER — MIDAZOLAM HCL 2 MG/2ML IJ SOLN
4.0000 mg | Freq: Once | INTRAMUSCULAR | Status: AC
  Administered 2017-08-01: 4 mg via INTRAVENOUS

## 2017-08-02 MED ORDER — VANCOMYCIN HCL IN DEXTROSE 1-5 GM/200ML-% IV SOLN
1000.0000 mg | INTRAVENOUS | Status: DC
  Administered 2017-08-03: 1000 mg via INTRAVENOUS
  Filled 2017-08-02: qty 200

## 2017-08-02 MED ORDER — PROPOFOL 1000 MG/100ML IV EMUL
5.0000 ug/kg/min | INTRAVENOUS | Status: DC
  Administered 2017-08-02: 5 ug/kg/min via INTRAVENOUS
  Administered 2017-08-02 (×2): 45 ug/kg/min via INTRAVENOUS
  Administered 2017-08-02: 25 ug/kg/min via INTRAVENOUS
  Administered 2017-08-02: 20 ug/kg/min via INTRAVENOUS
  Administered 2017-08-03 (×2): 15 ug/kg/min via INTRAVENOUS
  Filled 2017-08-02 (×8): qty 100

## 2017-08-02 MED ORDER — FENTANYL CITRATE (PF) 100 MCG/2ML IJ SOLN: 100.0000 ug | Freq: Once | INTRAMUSCULAR | Status: DC

## 2017-08-02 MED ORDER — STERILE WATER FOR INJECTION IV SOLN
INTRAVENOUS | Status: AC
  Administered 2017-08-02: 04:00:00 via INTRAVENOUS
  Filled 2017-08-02: qty 850

## 2017-08-02 MED ORDER — NOREPINEPHRINE 16 MG/250ML-% IV SOLN
0.0000 ug/min | INTRAVENOUS | Status: DC
  Administered 2017-08-02: 30 ug/min via INTRAVENOUS
  Administered 2017-08-02: 27 ug/min via INTRAVENOUS
  Administered 2017-08-03: 29 ug/min via INTRAVENOUS
  Filled 2017-08-02 (×5): qty 250

## 2017-08-02 MED ORDER — BUDESONIDE 0.5 MG/2ML IN SUSP
0.5000 mg | Freq: Two times a day (BID) | RESPIRATORY_TRACT | Status: DC
  Administered 2017-08-02: 0.5 mg via RESPIRATORY_TRACT
  Filled 2017-08-02: qty 2

## 2017-08-02 MED ORDER — SODIUM CHLORIDE 0.9 % IV SOLN
1500.0000 mg | Freq: Once | INTRAVENOUS | Status: DC
  Filled 2017-08-02: qty 1500

## 2017-08-02 MED ORDER — NOREPINEPHRINE 4 MG/250ML-% IV SOLN
0.0000 ug/min | INTRAVENOUS | Status: DC
  Administered 2017-08-02: 23 ug/min via INTRAVENOUS
  Administered 2017-08-02: 2 ug/min via INTRAVENOUS
  Administered 2017-08-02: 26 ug/min via INTRAVENOUS
  Administered 2017-08-02: 29 ug/min via INTRAVENOUS
  Filled 2017-08-02 (×5): qty 250

## 2017-08-02 MED ORDER — CHLORHEXIDINE GLUCONATE 0.12% ORAL RINSE (MEDLINE KIT)
15.0000 mL | Freq: Two times a day (BID) | OROMUCOSAL | Status: DC
  Administered 2017-08-02 – 2017-08-03 (×3): 15 mL via OROMUCOSAL

## 2017-08-02 MED ORDER — SODIUM CHLORIDE 0.9 % IV SOLN
500.0000 mg | INTRAVENOUS | Status: DC
  Administered 2017-08-02 – 2017-08-03 (×2): 500 mg via INTRAVENOUS
  Filled 2017-08-02 (×2): qty 500

## 2017-08-02 MED ORDER — MORPHINE SULFATE (PF) 2 MG/ML IV SOLN: 1.0000 mg | Freq: Once | INTRAVENOUS | Status: DC

## 2017-08-02 MED ORDER — HYDROCORTISONE NA SUCCINATE PF 100 MG IJ SOLR
50.0000 mg | Freq: Four times a day (QID) | INTRAMUSCULAR | Status: DC
  Administered 2017-08-02: 50 mg via INTRAVENOUS
  Filled 2017-08-02: qty 2

## 2017-08-02 MED ORDER — METHYLPREDNISOLONE SODIUM SUCC 125 MG IJ SOLR
60.0000 mg | Freq: Four times a day (QID) | INTRAMUSCULAR | Status: DC
  Administered 2017-08-02 – 2017-08-03 (×7): 60 mg via INTRAVENOUS
  Filled 2017-08-02 (×7): qty 2

## 2017-08-02 MED ORDER — ORAL CARE MOUTH RINSE
15.0000 mL | OROMUCOSAL | Status: DC
  Administered 2017-08-02 – 2017-08-03 (×10): 15 mL via OROMUCOSAL

## 2017-08-02 MED ORDER — SODIUM BICARBONATE 8.4 % IV SOLN
100.0000 meq | Freq: Once | INTRAVENOUS | Status: AC
  Administered 2017-08-02: 100 meq via INTRAVENOUS
  Filled 2017-08-02: qty 50

## 2017-08-02 MED ORDER — PHENYLEPHRINE HCL-NACL 10-0.9 MG/250ML-% IV SOLN
0.0000 ug/min | INTRAVENOUS | Status: DC
  Administered 2017-08-02: 20 ug/min via INTRAVENOUS
  Administered 2017-08-03: 180 ug/min via INTRAVENOUS
  Filled 2017-08-02 (×3): qty 250

## 2017-08-02 MED ORDER — SODIUM CHLORIDE 0.9 % IV SOLN
INTRAVENOUS | Status: DC
  Administered 2017-08-02: 16:00:00 via INTRAVENOUS

## 2017-08-02 MED ORDER — PIPERACILLIN-TAZOBACTAM 3.375 G IVPB
3.3750 g | Freq: Three times a day (TID) | INTRAVENOUS | Status: DC
  Administered 2017-08-02 – 2017-08-03 (×6): 3.375 g via INTRAVENOUS
  Filled 2017-08-02 (×6): qty 50

## 2017-08-02 MED FILL — Medication: Qty: 1 | Status: AC

## 2017-08-02 NOTE — Progress Notes (Signed)
Pt. BP decreased down to 40's over 20's.  RN began increasing the Levophed per order.  Pt. Current BP 69/46.  Dr. Lamonte Sakai updated on patients condition.  Directed to continue to titrate the Levophed and decrease the Propofol as appropriate.  RN will continue to monitor.

## 2017-08-02 NOTE — Progress Notes (Signed)
First set of zoll pads broke when attempting to place them on patient.

## 2017-08-02 NOTE — Progress Notes (Signed)
Initial Nutrition Assessment  DOCUMENTATION CODES:   Obesity unspecified  INTERVENTION:   If TF warranted, recommend: - Vital High Protein @ 15 ml/hr (360 ml/day) - 60 ml Pro-stat BID - Free water flushes per MD  Tube feeding regimen provides 760 kcal, 92 grams of protein, and 302 ml of H2O.  Tube feeding regimen and current propofol provides 1399 total kcal (111% of needs).  NUTRITION DIAGNOSIS:   Inadequate oral intake related to inability to eat as evidenced by NPO status.  GOAL:   Provide needs based on ASPEN/SCCM guidelines  MONITOR:   Vent status, Labs, I & O's, Weight trends, Other (Comment)(GOC)  REASON FOR ASSESSMENT:   Malnutrition Screening Tool, Ventilator    ASSESSMENT:   84 year old female who presented to the ED with complaints of SOB. PMH significant for lung cancer s/p L lobectomy, diffuse large B-cell lymphoma currently undergoing chemotherapy, hypertension, COPD with chronic hypoxic respiratory failure, type 2 diabetes mellitus, diastolic CHF, and GERD. Pt with multifocal pneumonia, suspected GI bleed, and pancytopenia.  6/22 - pt intubated for respiratory insufficiency  Per critical care DO note yesterday, no TF for now. RD to leave TF recommendations.  Discussed pt with RN. RN reports pt with OG tube to left-intermittent suction at this time and TF unlikely to be initiated today.  Young granddaughter at pt's bedside at time of visit. No other family present. Unable to obtain detailed weight and diet history.  Per weight history in chart, pt has lost 10 lbs over the past 2 months. This is a 4.8% weight loss which is not significant for timeframe. Noted pt with CHF so suspect weight loss may be related in part to fluid fluctuations.  Patient is currently intubated on ventilator support. Pt with OG tube in "left upper quadrant consistent with location in the body of the stomach." MV: 8.5 L/min Temp (24hrs), Avg:98.4 F (36.9 C), Min:97.2 F (36.2  C), Max:99.1 F (37.3 C)  Propofol: 24.2 ml/hr (provides 639 kcal/day) Fentanyl: 20 ml/hr Levophed: 82.5 ml/hr  Medications reviewed and include: sliding scale Novolog, IV Pepcid  Labs reviewed: CO2 20 (L), BUN 22 (H), creatinine 1.31 (H), triglycerides 183 (H), hemoglobin 7.8 (L), HCT 23.1 (L) CBG's: 252, 172, 249, 249, 192, 279 x 24 hours  UOP: 900 ml x 24 hours I/O's: +2.5 L since admission  NUTRITION - FOCUSED PHYSICAL EXAM:    Most Recent Value  Orbital Region  No depletion  Upper Arm Region  No depletion  Thoracic and Lumbar Region  No depletion  Buccal Region  No depletion  Temple Region  No depletion  Clavicle Bone Region  No depletion  Clavicle and Acromion Bone Region  No depletion  Scapular Bone Region  Unable to assess  Dorsal Hand  No depletion  Patellar Region  No depletion  Anterior Thigh Region  Mild depletion  Posterior Calf Region  No depletion  Edema (RD Assessment)  None  Hair  Reviewed  Eyes  Unable to assess  Mouth  Unable to assess  Skin  Reviewed  Nails  Reviewed       Diet Order:   Diet Order           Diet NPO time specified  Diet effective now          EDUCATION NEEDS:   Not appropriate for education at this time  Skin:  Skin Assessment: Reviewed RN Assessment  Last BM:  08/01/17  Height:   Ht Readings from Last 1 Encounters:  08/01/17 5'  1" (1.549 m)    Weight:   Wt Readings from Last 1 Encounters:  07/13/2017 198 lb (89.8 kg)    Ideal Body Weight:  47.7 kg  BMI:  Body mass index is 37.41 kg/m.  Estimated Nutritional Needs:   Kcal:  408-1448 kcal/day  Protein:  >/= 95 grams/day  Fluid:  per MD goals    Gaynell Face, MS, RD, LDN Pager: [phone] Weekend/After Hours: [phone]

## 2017-08-02 NOTE — Progress Notes (Signed)
.. ..  Name: [name] [name] MRN: [id number] DOB: 10/26/1933    ADMISSION DATE:  07/21/2017 CONSULTATION DATE:  08/01/17  REFERRING MD :  Admitted under [place] ( RN called E link)  CHIEF COMPLAINT:  SOB  BRIEF PATIENT DESCRIPTION:  84 yr old female with PMHx of emphysema ( oxygen dept at baseline- 3L), DLBCL s/p chem, HTN, NSCLC Status post left upper lobectomy with lymph node dissection in 2016 s/p chemo presents to [facility] with shortness of breath and was being managed by [facility] for multifocal pneumonia, suspected GI bleed and pancytopenia.  PCCM was called when pt went into Acute resp distress and could not tolerate NIPPV.   SIGNIFICANT EVENTS  Acute respiratory distress  STUDIES:  CT chest:  1. No evidence of significant pulmonary embolus although motion artifact limits evaluation of peripheral pulmonary arteries. 2. Motion artifact severely limits evaluation of the lungs. Patchy airspace disease is demonstrated throughout the right lung and in the left lower lung likely representing multifocal pneumonia. Edema is also possible. 3. Diffuse emphysematous changes in the lungs. 4. Aortic atherosclerosis.  Coronary artery calcification. 5. Diffuse heterogeneous bone sclerosis may be due to renal osteodystrophy, sickle cell changes, or metastatic disease. No change. 6. Right hilar lymph nodes seen previously are less well visualized due to motion artifact but appear to be decreased in size.  CXR: 1. Increased interstitial opacity bilaterally, suspect for acute interstitial inflammatory process or edema superimposed on underlying chronic interstitial disease. 2. Stable left pleural thickening or scar    HISTORY OF PRESENT ILLNESS:  (obtained from EMR, other providers and patient and patient's family)  84 yr old female with PMHx of emphysema (oxygen dept at baseline- 3L), DLBCL s/p chem, HTN, NSCLC Status post left upper lobectomy with lymph node dissection in 2016 s/p chemo presents to [facility] with  shortness of breath and was being managed by [facility] for multifocal pneumonia, suspected GI bleed and pancytopenia.   PCCM was called when pt went into Acute resp distress and could not tolerate NIPPV.  ET intubated evening 6/22   SUBJECTIVE / Interval events:     VITAL SIGNS: Temp:  [97.2 F (36.2 C)-99.1 F (37.3 C)] 98.6 F (37 C) (06/23 0730) Pulse Rate:  [96-132] 102 (06/23 0730) Resp:  [10-66] 12 (06/23 0730) BP: (77-168)/(45-112) 119/67 (06/23 0730) SpO2:  [84 %-100 %] 97 % (06/23 0835) Arterial Line BP: (91-154)/(55-76) 116/63 (06/23 0730) FiO2 (%):  [70 %-100 %] 100 % (06/23 0835)  PHYSICAL EXAMINATION: General: Obese woman, ill-appearing, intubated Neuro: Sedated, grimaces with stimulation, moves her upper extremities but not following commands HEENT: ET tube in place, oropharynx otherwise clear Cardiovascular: Regular, no murmur Lungs: Coarse bilateral breath sounds with scattered crackles, no wheezing Abdomen: Obese, soft, nontender, positive bowel sounds Musculoskeletal: Port-A-Cath left anterior chest is accessed Skin: No rash  Recent Labs  Lab 07/22/2017 2017 08/02/17 0017 08/02/17 0226  NA 138 142 142  K 3.2* 5.0 4.7  CL 105 115* 114*  CO2 21* 20* 20*  BUN 18 21* 22*  CREATININE 1.17* 1.19* 1.31*  GLUCOSE 293* 282* 219*   Recent Labs  Lab 07/27/2017 2017 08/01/17 1553 08/02/17 0017 08/02/17 0226  HGB 7.6* 7.7* 8.1* 7.8*  HCT 22.4* 23.3* 24.4* 23.1*  WBC 4.1  --  15.4* 14.6*  PLT 80*  --  79* 74*   Dg Abd 1 View  Result Date: 08/02/2017 CLINICAL DATA:  OG tube placement EXAM: ABDOMEN - 1 VIEW COMPARISON:  None. FINDINGS: Enteric tube tip is  in the left upper quadrant consistent with location in the body of the stomach. Coarse infiltrates are demonstrated in the visualized lungs. IMPRESSION: Enteric tube tip is projected over the left upper quadrant consistent with location in the body of the stomach. Electronically Signed   By: Lucienne Capers M.D.   On:  08/02/2017 02:30   Ct Angio Chest Pe W/cm &/or Wo Cm  Result Date: 07/14/2017 CLINICAL DATA:  Shortness of breath. History of lung cancer with partial left lobectomy. General malaise since chemotherapy on Wednesday. EXAM: CT ANGIOGRAPHY CHEST WITH CONTRAST TECHNIQUE: Multidetector CT imaging of the chest was performed using the standard protocol during bolus administration of intravenous contrast. Multiplanar CT image reconstructions and MIPs were obtained to evaluate the vascular anatomy. CONTRAST:  138mL ISOVUE-370 IOPAMIDOL (ISOVUE-370) INJECTION 76% COMPARISON:  05/01/2017 FINDINGS: Cardiovascular: Good opacification of the central, motion artifact limits examination but there is moderately good opacification of the central and segmental pulmonary arteries. No focal filling defects demonstrated in the visualized vessels suggesting no evidence of significant central pulmonary embolus. Normal caliber thoracic aorta. Calcification of aorta and coronary arteries. Great vessel origins are patent. Normal heart size. No pericardial effusion. Left central venous catheter with tip in the mid SVC region. Mediastinum/Nodes: Right hilar lymph nodes seen previously are less well visualized today due to motion artifact but appear to be decreasing in size. This suggests response to chemotherapy. Right pretracheal nodes measuring up to 9 mm in diameter are unchanged. Esophagus is decompressed. Lungs/Pleura: Evaluation is severely limited by motion artifact. There is evidence of diffuse emphysematous change throughout both lungs but more prominent on the right. There is interval development of patchy airspace disease throughout the right lung and in the left lower lung. This is likely to represent pneumonia but could also indicate edema. Left upper lobectomy. Airways are grossly patent. Upper Abdomen: No acute process demonstrated on limited imaging of the upper abdomen. Musculoskeletal: Degenerative changes in the spine.  Diffuse heterogeneous bone sclerosis may indicate metabolic change, renal osteodystrophy, sickle cell changes, or metastatic disease. No change since prior study. Review of the MIP images confirms the above findings. IMPRESSION: 1. No evidence of significant pulmonary embolus although motion artifact limits evaluation of peripheral pulmonary arteries. 2. Motion artifact severely limits evaluation of the lungs. Patchy airspace disease is demonstrated throughout the right lung and in the left lower lung likely representing multifocal pneumonia. Edema is also possible. 3. Diffuse emphysematous changes in the lungs. 4. Aortic atherosclerosis.  Coronary artery calcification. 5. Diffuse heterogeneous bone sclerosis may be due to renal osteodystrophy, sickle cell changes, or metastatic disease. No change. 6. Right hilar lymph nodes seen previously are less well visualized due to motion artifact but appear to be decreased in size. Aortic Atherosclerosis (ICD10-I70.0) and Emphysema (ICD10-J43.9). Electronically Signed   By: Lucienne Capers M.D.   On: 08/08/2017 22:46   Dg Chest Port 1 View  Result Date: 08/02/2017 CLINICAL DATA:  ETT placement EXAM: PORTABLE CHEST 1 VIEW COMPARISON:  CT chest 07/16/2017, radiograph 07/19/2017, 06/08/2016 FINDINGS: Endotracheal tube tip is about 1 cm superior to the carina. Left-sided central venous port tip overlies the SVC. Esophageal tube tip below the diaphragm but non included on the image. Worsened bilateral interstitial and airspace disease within the bilateral lungs. Stable cardiomediastinal silhouette with aortic atherosclerosis. No pneumothorax. IMPRESSION: 1. Endotracheal tube tip about 1 cm superior to the carina. Esophageal tube tip below the diaphragm but non included on the image 2. Worsening bilateral interstitial and alveolar disease  which may reflect pulmonary edema or bilateral pneumonia. Electronically Signed   By: Donavan Foil M.D.   On: 08/02/2017 01:28   Dg  Chest Port 1 View  Result Date: 07/13/2017 CLINICAL DATA:  Shortness of breath EXAM: PORTABLE CHEST 1 VIEW COMPARISON:  06/08/2017, CT chest 05/01/2017 FINDINGS: Left-sided central venous port tip overlies the SVC. Diffuse increased interstitial opacity compared to prior suspect for acute interstitial edema or inflammatory process on underlying chronic disease. Right hilar/infrahilar opacity, likely corresponding to adenopathy noted on prior. Aortic atherosclerosis. No pneumothorax. Mild left pleural thickening or scar IMPRESSION: 1. Increased interstitial opacity bilaterally, suspect for acute interstitial inflammatory process or edema superimposed on underlying chronic interstitial disease. 2. Stable left pleural thickening or scar. Electronically Signed   By: Donavan Foil M.D.   On: 07/16/2017 20:43    ASSESSMENT / PLAN: NEURO: Prior to intubation no neurological deficits AAOx4 Post intubation- Sedation started Continuous Fentanyl ggt prn Versed RASS goal -1 to -2  CARDIAC: Shock, presumed septic plus component of sedation, consider intrathoracic pressure from mechanical ventilation.  Consider chemotherapy related cardiomyopathy Troponin peak 0.22 ECHO in April 2019: LVEF 60-65% G1DD Norepinephrine started at time of intubation, currently 28.  Wean to maintain MAP > 65 Antibiotics ordered as below Repeat echocardiogram to compare with April 2019   PULMONARY: Acute on Chronic Hypoxic and Hypercapnic Respiratory Failure Bilateral pulmonary infiltrates, consider infection, cardiogenic or noncardiogenic edema, acute lung injury from either infection, chemotherapy or recent transfusion Continue current ventilator strategy, modified ARDS protocol given her acidosis.  May be able to decrease her tidal volumes as we go forward. Currently on FiO2 100%, PEEP 10, work on weaning the FiO2.  I will leave the PEEP at 10 for now given her profound emphysematous changes and risk for  pneumothorax Empiric antibiotics as ordered She would benefit from bronchoscopy to look for opportunistic, viral, typical, atypical organisms.  Has not to do so 100% FiO2.  If we are able to wean then bronchoscopy would be beneficial Bronchodilators as ordered Started on empiric corticosteroids for possible pneumonitis 6/23  ID: Based on last CBC ANC 2665, Not currently neutropenic Presumed septic shock Presumed HCAP MRSA + nares Continue current vancomycin, Zosyn Add azithro 6/23 to cover atypicals.  Follow culture data including RVP Would like to pursue bronchoscopy to rule out CMV, etc. if FiO2 needs improved.  Reassuring that she is not neutropenic  Endocrine: DM Continue ICU hyperglycemia protocol TSH pending On empiric steroids as above  GI: Nutrition Stress ulcer prophylaxis OG tube to low intermittent suction On Pepcid  Heme: Anemia of Chronic Disease DVT PPx Enoxaparin held, SCDs in place Follow CBC Transfusion threshold Hgb > 7.0   RENAL Acute renal failure Non-anion gap metabolic acidosis + AG metabolic acidosis Follow urine output, BMP with restoration perfusion pressures Avoid nephrotoxins Complete bicarbonate drip and current bag and then discontinue CK, uric acid reassuring   DISPOSITION: ICU CODE STATUS: FULL PROGNOSIS: Guarded FAMILY:  Discussed patient's status, prognosis, the differential diagnosis with her daughter, granddaughter, several family members at bedside 6/23.  I explained that this is likely either a lung injury or pneumonia or both.  Explained that we need to see some improvement in her ventilator needs, hemodynamics over the next several days.  We plan to continue her current level of support and reassess goals based on her improvement.  They have been fairly clear that she would not want to be mechanically ventilated indefinitely  Independent CC time 35 minutes  Herbie Baltimore  Lamonte Sakai, MD, PhD 08/02/2017, 9:31 AM [facility] [phone] or if no answer [phone]

## 2017-08-02 NOTE — Progress Notes (Addendum)
[place] Progress Note Patient Name: [name] [name] DOB: 09-18-1933 MRN: [id number]   Date of Service  08/02/2017  HPI/Events of Note  Hypotension - SBP = 50 by A-line. However, A-line trace is not good. Fentanyl and Propofol IV infusions now on hold. Last pH = 7.132.  eICU Interventions  Will order: 1. Titrate Norepinephrine IV infusion for MAP > 65. 2. NaHCO3 100 meq IV now.  3. Phenylephrine IV infusion. Titrate to MAP > 65. 4. ABG at 12 midnight.      Intervention Category Major Interventions: Hypotension - evaluation and management  Sommer,Steven Cornelia Copa 08/02/2017, 10:51 PM

## 2017-08-02 NOTE — Progress Notes (Signed)
Pharmacy Antibiotic Note  [name] [name] is a 84 y.o. female admitted on 07/13/2017 with pneumonia.  Pharmacy has been consulted for Vancomycin and Zosyn dosing.  Plan: Zosyn 3.375g IV q8h (4 hour infusion).   Vancomycin 2gm iv x1 (given 6/21 at 2349), then 1gm iv q48hr with next dose 6/24 at 0600  Goal AUC = 400 - 500 for all indications, except meningitis (goal AUC > 500 and Cmin 15-20 mcg/mL)   Height: 5\' 1"  (154.9 cm) Weight: 198 lb (89.8 kg) IBW/kg (Calculated) : 47.8  Temp (24hrs), Avg:98 F (36.7 C), Min:97.2 F (36.2 C), Max:99.1 F (37.3 C)  Recent Labs  Lab 07/29/17 1011 07/24/2017 2017 07/30/2017 2047 07/16/2017 2230 08/01/17 0900 08/02/17 0017 08/02/17 0138 08/02/17 0226  WBC 0.7* 4.1  --   --   --  15.4*  --  14.6*  CREATININE 0.90 1.17*  --   --   --  1.19*  --  1.31*  LATICACIDVEN  --   --  4.40* 6.89* 2.8*  --  3.2*  --     Estimated Creatinine Clearance: 34.3 mL/min (A) (by C-G formula based on SCr of 1.31 mg/dL (H)).    Allergies  Allergen Reactions  . Lipitor [Atorvastatin] Other (See Comments)    myalgia's  . Darvon [Propoxyphene Hcl]     UNSPECIFIED REACTION     Antimicrobials this admission: Vancomycin 07/12/2017 >> Zosyn 08/02/2017 >>   Dose adjustments this admission: -  Microbiology results: -  Thank you for allowing pharmacy to be a part of this patient's care.  Nani Skillern Crowford 08/02/2017 6:24 AM

## 2017-08-02 NOTE — Progress Notes (Signed)
RN updated Dr. Lamonte Sakai on ABG results, RN instructed to increase RR on ventilator to 28.  RN has done so and will continue to monitor.

## 2017-08-02 NOTE — Progress Notes (Signed)
RN stopped Sodium Bicarbonate drip. RN  Updated Dr. Lamonte Sakai on status of drip.  Orders received for NS and an ABG.  Blood sugars elevated at 380 MD aware.  Will continue to monitor.

## 2017-08-02 NOTE — Progress Notes (Signed)
RT pulled back ETT tube from 25 to 23 per verbal order from CCM. RT called and requested orders for tube and for new vent settings. RT will continue to moniter.

## 2017-08-02 NOTE — Progress Notes (Signed)
Unable to perform echo due to respiratory distress.  Spoke with attending doctor and will attempt tomorrow.

## 2017-08-03 ENCOUNTER — Other Ambulatory Visit (HOSPITAL_COMMUNITY): Payer: Medicare HMO

## 2017-08-03 ENCOUNTER — Inpatient Hospital Stay (HOSPITAL_COMMUNITY): Payer: Medicare HMO

## 2017-08-03 DIAGNOSIS — R6521 Severe sepsis with septic shock: Secondary | ICD-10-CM

## 2017-08-03 LAB — BLOOD GAS, ARTERIAL
ACID-BASE DEFICIT: 5.7 mmol/L — AB (ref 0.0–2.0)
Acid-base deficit: 7.6 mmol/L — ABNORMAL HIGH (ref 0.0–2.0)
BICARBONATE: 20.1 mmol/L (ref 20.0–28.0)
Bicarbonate: 20.8 mmol/L (ref 20.0–28.0)
DRAWN BY: 103701
DRAWN BY: 225631
FIO2: 100
FIO2: 90
MECHVT: 400 mL
O2 SAT: 93.5 %
O2 Saturation: 84.6 %
PATIENT TEMPERATURE: 98.6
PCO2 ART: 47 mmHg (ref 32.0–48.0)
PEEP/CPAP: 10 cmH2O
PEEP: 10 cmH2O
PH ART: 7.259 — AB (ref 7.350–7.450)
PO2 ART: 61.9 mmHg — AB (ref 83.0–108.0)
Patient temperature: 99.7
RATE: 20 resp/min
RATE: 28 resp/min
VT: 400 mL
pCO2 arterial: 65 mmHg — ABNORMAL HIGH (ref 32.0–48.0)
pH, Arterial: 7.132 — CL (ref 7.350–7.450)
pO2, Arterial: 77.3 mmHg — ABNORMAL LOW (ref 83.0–108.0)

## 2017-08-03 LAB — STREP PNEUMONIAE URINARY ANTIGEN: STREP PNEUMO URINARY ANTIGEN: NEGATIVE

## 2017-08-03 LAB — CBC
HCT: 21.8 % — ABNORMAL LOW (ref 36.0–46.0)
HEMOGLOBIN: 7.2 g/dL — AB (ref 12.0–15.0)
MCH: 28.2 pg (ref 26.0–34.0)
MCHC: 33 g/dL (ref 30.0–36.0)
MCV: 85.5 fL (ref 78.0–100.0)
PLATELETS: 76 10*3/uL — AB (ref 150–400)
RBC: 2.55 MIL/uL — AB (ref 3.87–5.11)
RDW: 17.9 % — ABNORMAL HIGH (ref 11.5–15.5)
WBC: 17.8 10*3/uL — AB (ref 4.0–10.5)

## 2017-08-03 LAB — GLUCOSE, CAPILLARY
GLUCOSE-CAPILLARY: 210 mg/dL — AB (ref 65–99)
GLUCOSE-CAPILLARY: 227 mg/dL — AB (ref 65–99)
Glucose-Capillary: 301 mg/dL — ABNORMAL HIGH (ref 65–99)
Glucose-Capillary: 268 mg/dL — ABNORMAL HIGH (ref 65–99)
Glucose-Capillary: 176 mg/dL — ABNORMAL HIGH (ref 65–99)
Glucose-Capillary: 157 mg/dL — ABNORMAL HIGH (ref 65–99)

## 2017-08-03 LAB — BASIC METABOLIC PANEL
Anion gap: 10 (ref 5–15)
BUN: 26 mg/dL — ABNORMAL HIGH (ref 6–20)
CHLORIDE: 107 mmol/L (ref 101–111)
CO2: 24 mmol/L (ref 22–32)
CREATININE: 1.8 mg/dL — AB (ref 0.44–1.00)
Calcium: 7.5 mg/dL — ABNORMAL LOW (ref 8.9–10.3)
GFR calc non Af Amer: 25 mL/min — ABNORMAL LOW (ref >60)
GFR, EST AFRICAN AMERICAN: 29 mL/min — AB (ref >60)
Glucose, Bld: 294 mg/dL — ABNORMAL HIGH (ref 65–99)
Potassium: 4.3 mmol/L (ref 3.5–5.1)
SODIUM: 141 mmol/L (ref 135–145)

## 2017-08-03 LAB — PROCALCITONIN: PROCALCITONIN: 2.24 ng/mL

## 2017-08-03 LAB — URINE CULTURE

## 2017-08-03 LAB — MAGNESIUM
Magnesium: 2.3 mg/dL (ref 1.7–2.4)
Magnesium: 2.4 mg/dL (ref 1.7–2.4)
Magnesium: 2.3 mg/dL (ref 1.7–2.4)

## 2017-08-03 LAB — PHOSPHORUS
Phosphorus: 3.8 mg/dL (ref 2.5–4.6)
Phosphorus: 4.2 mg/dL (ref 2.5–4.6)
Phosphorus: 4.1 mg/dL (ref 2.5–4.6)

## 2017-08-03 LAB — TSH: TSH: 0.372 u[IU]/mL (ref 0.350–4.500)

## 2017-08-03 MED ORDER — VITAL HIGH PROTEIN PO LIQD
1000.0000 mL | ORAL | Status: DC
  Administered 2017-08-03: 1000 mL

## 2017-08-03 MED ORDER — INSULIN ASPART 100 UNIT/ML ~~LOC~~ SOLN
3.0000 [IU] | SUBCUTANEOUS | Status: DC
  Administered 2017-08-03: 6 [IU] via SUBCUTANEOUS
  Administered 2017-08-03: 9 [IU] via SUBCUTANEOUS
  Administered 2017-08-03: 6 [IU] via SUBCUTANEOUS
  Administered 2017-08-03: 9 [IU] via SUBCUTANEOUS

## 2017-08-03 MED ORDER — ACETAMINOPHEN 325 MG PO TABS
650.0000 mg | ORAL_TABLET | Freq: Four times a day (QID) | ORAL | Status: DC | PRN
  Administered 2017-08-03: 650 mg
  Filled 2017-08-03: qty 2

## 2017-08-03 MED ORDER — SODIUM BICARBONATE 8.4 % IV SOLN
100.0000 meq | Freq: Once | INTRAVENOUS | Status: AC
  Administered 2017-08-03: 100 meq via INTRAVENOUS

## 2017-08-03 MED ORDER — PHENYLEPHRINE HCL 10 MG/ML IJ SOLN
0.0000 ug/min | INTRAMUSCULAR | Status: DC
  Administered 2017-08-03 – 2017-08-04 (×13): 400 ug/min via INTRAVENOUS
  Filled 2017-08-03: qty 4
  Filled 2017-08-03: qty 40
  Filled 2017-08-03 (×2): qty 4
  Filled 2017-08-03: qty 40
  Filled 2017-08-03: qty 4
  Filled 2017-08-03 (×3): qty 40
  Filled 2017-08-03 (×2): qty 4
  Filled 2017-08-03: qty 40
  Filled 2017-08-03 (×4): qty 4

## 2017-08-03 MED ORDER — SODIUM CHLORIDE 0.9 % IV SOLN: 0.0000 ug/min | INTRAVENOUS | Status: DC

## 2017-08-03 MED ORDER — PRO-STAT SUGAR FREE PO LIQD
30.0000 mL | Freq: Two times a day (BID) | ORAL | Status: DC
  Administered 2017-08-03 (×2): 30 mL
  Filled 2017-08-03 (×2): qty 30

## 2017-08-03 MED ORDER — PANTOPRAZOLE SODIUM 40 MG PO PACK
40.0000 mg | PACK | ORAL | Status: DC
  Administered 2017-08-03: 40 mg
  Filled 2017-08-03: qty 20

## 2017-08-03 MED ORDER — IPRATROPIUM-ALBUTEROL 0.5-2.5 (3) MG/3ML IN SOLN
3.0000 mL | Freq: Four times a day (QID) | RESPIRATORY_TRACT | Status: DC
  Administered 2017-08-03 – 2017-08-04 (×3): 3 mL via RESPIRATORY_TRACT
  Filled 2017-08-03 (×3): qty 3

## 2017-08-03 MED ORDER — ALLOPURINOL 100 MG PO TABS
100.0000 mg | ORAL_TABLET | Freq: Two times a day (BID) | ORAL | Status: DC
  Administered 2017-08-03: 100 mg
  Filled 2017-08-03 (×2): qty 1

## 2017-08-03 MED ORDER — ACETAMINOPHEN 650 MG RE SUPP: 650.0000 mg | Freq: Four times a day (QID) | RECTAL | Status: DC | PRN

## 2017-08-03 NOTE — Progress Notes (Signed)
OG NOT IN PLACE; ADVANCED AS REQUEST PER RADIOLOGY.

## 2017-08-03 NOTE — Progress Notes (Signed)
[place] Progress Note Patient Name: [name] [name] DOB: 08-17-1933 MRN: [id number]   Date of Service  08/03/2017  HPI/Events of Note  ABG on 90%/PRVC 28/TV 400/P 10 = 7.25/46.8/88.3  eICU Interventions  Will order: 1. NaHCO3 100 meq IV now.  2. Repeat ABG at 5 AM.     Intervention Category Major Interventions: Acid-Base disturbance - evaluation and management;Respiratory failure - evaluation and management  [place] Droll Eugene 08/03/2017, 1:10 AM

## 2017-08-03 NOTE — Progress Notes (Signed)
THE PATIENT DID NOT TOLERATE SUCTIONING; BP DROPPED SIGNIFICANTLY. PATIENT WOKE UP, SEE MAR. FAMILY AT BEDSIDE.

## 2017-08-03 NOTE — Progress Notes (Signed)
DR. Halford Chessman NOTIFIED OF THE PATIENTS DECREASED BLOOP PRESSURE.  VERBAL ORDERS TO INCREASE LEVOPHED UP TO 50 MCG/MIN TO EFFECT.

## 2017-08-03 NOTE — Progress Notes (Addendum)
Pharmacy Antibiotic Note  [name] [name] is a 84 y.o. female admitted on 07/23/2017 with pneumonia.  Pharmacy has been consulted for Vancomycin, cefepime dosing.  Today, 08/03/2017 Day #3 antibiotics  Renal :SCr worsening - nonoliguric but RN reports UOP declining  WBC trending up  Procalcitonin trending up  Afebrile ( < 100.4)  Plan:  Vancomycin 1gm IV q48 - dose given this am.  Will continue this dose and monitor SCr.  Suspect will need to check level 6/26 prior to next dose (if vancomycin continued at that time)  Zosyn 3.375 gm IV q8h over 4h infusion  Height: 5\' 1"  (154.9 cm) Weight: 198 lb (89.8 kg) IBW/kg (Calculated) : 47.8  Temp (24hrs), Avg:99.1 F (37.3 C), Min:98.6 F (37 C), Max:99.9 F (37.7 C)  Recent Labs  Lab 07/29/17 1011 07/21/2017 2017 07/28/2017 2047 07/11/2017 2230 08/01/17 0900 08/02/17 0017 08/02/17 0138 08/02/17 0226 08/02/17 0438 08/03/17 0330  WBC 0.7* 4.1  --   --   --  15.4*  --  14.6*  --  17.8*  CREATININE 0.90 1.17*  --   --   --  1.19*  --  1.31*  --  1.80*  LATICACIDVEN  --   --  4.40* 6.89* 2.8*  --  3.2*  --  1.9  --     Estimated Creatinine Clearance: 25 mL/min (A) (by C-G formula based on SCr of 1.8 mg/dL (H)).    Allergies  Allergen Reactions  . Lipitor [Atorvastatin] Other (See Comments)    myalgia's  . Darvon [Propoxyphene Hcl]     UNSPECIFIED REACTION     Antimicrobials this admission:  6/21 Vancomycin  >> 6/22, 6/23 >> 6/21 Cefepime  >> 6/22 6/22 Rocephin >> 6/23 6/22 Azithromycin >> 6/23 Zosyn >>  Dose adjustments this admission:   Microbiology results:  6/21 BCx: NGTD 6/21 UCx: 10K K. Pneumoniae (R amp, I NTF) 6/21 U/A: dirty 6/22 MRSA PCR: positive Legionella/Strep pneumo: not collected 6/22 trach aspirate: pending 6/23 resp panel: neg  Thank you for allowing pharmacy to be a part of this patient's care.  Doreene Eland, PharmD, BCPS.   Pager: [phone] 08/03/2017 9:05 AM

## 2017-08-03 NOTE — Progress Notes (Signed)
.. ..  Name: [name] [name] MRN: [id number] DOB: 08/03/1933    ADMISSION DATE:  08/08/2017 CONSULTATION DATE:  08/01/17  REFERRING MD: Dr. Cathlean Sauer, [facility]  CHIEF COMPLAINT:  SOB  BRIEF PATIENT DESCRIPTION:  84 yo female former smoker with dx of diffuse large B cell lymphoma April 2019 with last round of CHOP in June 2019 presented with dyspnea, hypoxia from PNA with chemotherapy induced pancytopenia.  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  SUBJECTIVE: Remains on full vent support, pressors.   VITAL SIGNS: BP 94/60   Pulse (!) 134   Temp 99.9 F (37.7 C)   Resp (!) 28   Ht '5\' 1"'$  (1.549 m)   Wt 198 lb (89.8 kg)   SpO2 99%   BMI 37.41 kg/m   INTAKE/OUTPUT: I/O last 3 completed shifts: In: 6184.1 [I.V.:5568.9; IV Piggyback:615.2] Out: 2500 [Urine:2100; Emesis/NG output:400]  PHYSICAL EXAMINATION:  General - sedated Eyes - pupils reactive ENT - ETT in place Cardiac - regular, no murmur Chest - decreased BS, scattered rhonchi Abd - soft, non tender Ext - 1+ edema Skin - extremities cool Neuro - RASS -3  LABS: CBC Recent Labs    08/02/17 0017 08/02/17 0226 08/02/17 1944 08/03/17 0330  WBC 15.4* 14.6*  --  17.8*  HGB 8.1* 7.8* 7.5* 7.2*  HCT 24.4* 23.1* 22.9* 21.8*  PLT 79* 74*  --  76*    Coag's Recent Labs    08/01/17 0900  APTT 36  INR 1.20    BMET Recent Labs    08/02/17 0017 08/02/17 0226 08/03/17 0330  NA 142 142 141  K 5.0 4.7 4.3  CL 115* 114* 107  CO2 20* 20* 24  BUN 21* 22* 26*  CREATININE 1.19* 1.31* 1.80*  GLUCOSE 282* 219* 294*    Electrolytes Recent Labs    08/02/17 0017 08/02/17 0226 08/03/17 0330  CALCIUM 8.5* 8.3* 7.5*  MG 2.3  --  2.3  PHOS 3.9  --  3.8    Sepsis Markers Recent Labs    08/01/17 0900 08/02/17 0138 08/03/17 0330  PROCALCITON 1.23 1.52 2.24    ABG Recent Labs    08/02/17 1710 08/02/17 2319 08/03/17 0409  PHART  7.132* 7.253* 7.259*  PCO2ART 65.0* 46.8 47.0  PO2ART 61.9* 88.3 77.3*    Liver Enzymes Recent Labs    07/25/2017 2017 08/02/17 0017  AST 26 62*  ALT 21 42  ALKPHOS 95 120  BILITOT 1.1 0.6  ALBUMIN 2.6* 2.6*    Cardiac Enzymes Recent Labs    08/01/17 0942 08/01/17 1553  TROPONINI 0.22* 0.17*    Glucose Recent Labs    08/02/17 1117 08/02/17 1553 08/02/17 1921 08/02/17 2323 08/03/17 0312 08/03/17 0747  GLUCAP 333* 380* 381* 385* 301* 268*    Imaging Dg Abd 1 View  Result Date: 08/02/2017 CLINICAL DATA:  OG tube placement EXAM: ABDOMEN - 1 VIEW COMPARISON:  None. FINDINGS: Enteric tube tip is in the left upper quadrant consistent with location in the body of the stomach. Coarse infiltrates are demonstrated in the visualized lungs. IMPRESSION: Enteric tube tip is projected over the left upper quadrant consistent with location in the body of the stomach. Electronically Signed   By: Lucienne Capers M.D.   On: 08/02/2017 02:30   Dg Chest Port 1 View  Result Date: 08/03/2017 CLINICAL DATA:  Acute respiratory failure. EXAM: PORTABLE CHEST 1 VIEW COMPARISON:  August 02, 2017 FINDINGS: The cardiomediastinal  silhouette is stable. The ETT is in good position. The distal tip of the NG tube appears to be 4.9 cm above the GE junction. Bilateral pulmonary opacities, more focal in the right base, are stable. No other changes. IMPRESSION: 1. The distal tip of the NG tube appears to be within the esophagus, 4.9 cm above the GE junction. Recommend repositioning. The ETT is in good position. 2. Stable bilateral pulmonary opacities, more focal in the right base. These results will be called to the ordering clinician or representative by the Radiologist Assistant, and communication documented in the PACS or zVision Dashboard. Electronically Signed   By: Dorise Bullion III M.D   On: 08/03/2017 07:36   Dg Chest Port 1 View  Result Date: 08/02/2017 CLINICAL DATA:  ETT placement EXAM: PORTABLE  CHEST 1 VIEW COMPARISON:  CT chest 07/19/2017, radiograph 08/05/2017, 06/08/2016 FINDINGS: Endotracheal tube tip is about 1 cm superior to the carina. Left-sided central venous port tip overlies the SVC. Esophageal tube tip below the diaphragm but non included on the image. Worsened bilateral interstitial and airspace disease within the bilateral lungs. Stable cardiomediastinal silhouette with aortic atherosclerosis. No pneumothorax. IMPRESSION: 1. Endotracheal tube tip about 1 cm superior to the carina. Esophageal tube tip below the diaphragm but non included on the image 2. Worsening bilateral interstitial and alveolar disease which may reflect pulmonary edema or bilateral pneumonia. Electronically Signed   By: Donavan Foil M.D.   On: 08/02/2017 01:28    STUDIES: CT angio chest 6/21 >> decreased LAN, diffuse emphysema, patchy ASD Rt > Lt lung, diffuse bone sclerosis   CULTURES: Urine 6/21 >> Klebsiella pneumoniae (resistant to ampicillin, nitrofurantoin) Blood 6/21 >> Pneumococcal Ag 6/22 >> Legionella Ag 6/22 >> Sputum 6/22 >>  Respiratory viral panel 6/23 >> negative  ANTIBIOTICS: Vancomycin 6/21 >> Cefepime 6/21 >> 6/21 Rocephine 6/22 >> 6/22 Zosyn 6/22 >> Zithromax 6/22 >>  EVENTS: 6/21 Admit 6/22 VDRF, start pressors, transfuse PRBC 6/23 start steroids trial  LINES/TUBES: Lt Port 5/01 >>  ETT 6/22 >>  Rt radial aline 6/22 >>   ASSESSMENT / PLAN:  Acute on chronic hypoxic, hypercapnic respiratory failure most likely from HCAP. COPD exacerbation with emphysema. - f/u CXR, ABG - PEEP/FiO2 to keep SpO2 88 to 95%; PEEP might be limited due to emphysema and risk of barotrauma - scheduled BDs - continue solumedrol for AECOPD and possible pneumonitis  Septic shock from HCAP. - pressors to keep MAP > 65 - f/u Echo  Acute renal failure with ATN. Metabolic acidosis with lactic acidosis. - baseline creatinine 0.96 from 07/22/17 - f/u BMET, monitor renal fx  Diffuse large B  cell lymphoma. Chemotherapy induced pancytopenia. - f/u CBC with differential - followed by Dr. Julien Nordmann as outpt  DM type II with steroid induced hyperglycemia. Hx of gout. - SSI - allopurinol  Acute metabolic encephalopathy. - RASS goal -2 to -3  DVT prophylaxis - SCDs SUP - protonix Nutrition - tube feeds Goals of care - full code  Met with several members of pt's family.  Discussed current status, and plan of care.  CC time 39 minutes  Chesley Mires, MD [place] 08/03/2017, 10:16 AM

## 2017-08-03 NOTE — Progress Notes (Signed)
HR in 130s. Unable to perform echo at this time.

## 2017-08-04 ENCOUNTER — Inpatient Hospital Stay (HOSPITAL_COMMUNITY): Payer: Medicare HMO

## 2017-08-04 ENCOUNTER — Other Ambulatory Visit (HOSPITAL_COMMUNITY): Payer: Medicare HMO

## 2017-08-04 DIAGNOSIS — G9340 Encephalopathy, unspecified: Secondary | ICD-10-CM

## 2017-08-04 DIAGNOSIS — N179 Acute kidney failure, unspecified: Secondary | ICD-10-CM

## 2017-08-04 DIAGNOSIS — J9601 Acute respiratory failure with hypoxia: Secondary | ICD-10-CM

## 2017-08-04 DIAGNOSIS — J9602 Acute respiratory failure with hypercapnia: Secondary | ICD-10-CM

## 2017-08-04 LAB — PHOSPHORUS: PHOSPHORUS: 8.7 mg/dL — AB (ref 2.5–4.6)

## 2017-08-04 LAB — BLOOD GAS, ARTERIAL
ACID-BASE DEFICIT: 20.4 mmol/L — AB (ref 0.0–2.0)
ACID-BASE DEFICIT: 9.7 mmol/L — AB (ref 0.0–2.0)
BICARBONATE: 10 mmol/L — AB (ref 20.0–28.0)
BICARBONATE: 18.9 mmol/L — AB (ref 20.0–28.0)
DRAWN BY: 225631
Drawn by: 225631
FIO2: 80
FIO2: 80
MECHVT: 400 mL
O2 Saturation: 90.5 %
O2 Saturation: 90.5 %
PATIENT TEMPERATURE: 99.3
PEEP/CPAP: 10 cmH2O
PEEP: 10 cmH2O
PO2 ART: 84 mmHg (ref 83.0–108.0)
Patient temperature: 99.3
RATE: 28 resp/min
RATE: 28 resp/min
VT: 400 mL
pCO2 arterial: 51.7 mmHg — ABNORMAL HIGH (ref 32.0–48.0)
pCO2 arterial: 68.5 mmHg (ref 32.0–48.0)
pH, Arterial: 6.923 — CL (ref 7.350–7.450)
pH, Arterial: 7.072 — CL (ref 7.350–7.450)
pO2, Arterial: 95.5 mmHg (ref 83.0–108.0)

## 2017-08-04 LAB — CBC WITH DIFFERENTIAL/PLATELET
BASOS ABS: 0 10*3/uL (ref 0.0–0.1)
BASOS PCT: 0 %
EOS PCT: 0 %
Eosinophils Absolute: 0 10*3/uL (ref 0.0–0.7)
HEMATOCRIT: 15.8 % — AB (ref 36.0–46.0)
HEMOGLOBIN: 5.1 g/dL — AB (ref 12.0–15.0)
LYMPHS ABS: 1.7 10*3/uL (ref 0.7–4.0)
Lymphocytes Relative: 7 %
MCH: 28.5 pg (ref 26.0–34.0)
MCHC: 32.3 g/dL (ref 30.0–36.0)
MCV: 88.3 fL (ref 78.0–100.0)
Monocytes Absolute: 1.5 10*3/uL — ABNORMAL HIGH (ref 0.1–1.0)
Monocytes Relative: 6 %
NRBC: 68 /100{WBCs} — AB
Neutro Abs: 19.4 10*3/uL — ABNORMAL HIGH (ref 1.7–7.7)
Neutrophils Relative %: 79 %
Other: 8 %
Platelets: 41 10*3/uL — ABNORMAL LOW (ref 150–400)
RBC: 1.79 MIL/uL — ABNORMAL LOW (ref 3.87–5.11)
RDW: 18.6 % — ABNORMAL HIGH (ref 11.5–15.5)
WBC: 24.6 10*3/uL — ABNORMAL HIGH (ref 4.0–10.5)

## 2017-08-04 LAB — BASIC METABOLIC PANEL
ANION GAP: 20 — AB (ref 5–15)
BUN: 32 mg/dL — ABNORMAL HIGH (ref 8–23)
CALCIUM: 5.5 mg/dL — AB (ref 8.9–10.3)
CHLORIDE: 108 mmol/L (ref 98–111)
CO2: 30 mmol/L (ref 22–32)
Creatinine, Ser: 2.57 mg/dL — ABNORMAL HIGH (ref 0.44–1.00)
GFR calc Af Amer: 19 mL/min — ABNORMAL LOW (ref >60)
GFR calc non Af Amer: 16 mL/min — ABNORMAL LOW (ref >60)
GLUCOSE: 160 mg/dL — AB (ref 70–99)
Potassium: 7 mmol/L (ref 3.5–5.1)
Sodium: 158 mmol/L — ABNORMAL HIGH (ref 135–145)

## 2017-08-04 LAB — CULTURE, RESPIRATORY W GRAM STAIN: Culture: NORMAL

## 2017-08-04 LAB — CULTURE, RESPIRATORY

## 2017-08-04 LAB — LACTIC ACID, PLASMA: Lactic Acid, Venous: 17.7 mmol/L (ref 0.5–1.9)

## 2017-08-04 LAB — MAGNESIUM: Magnesium: 2.5 mg/dL — ABNORMAL HIGH (ref 1.7–2.4)

## 2017-08-04 LAB — CORTISOL: CORTISOL PLASMA: 28 ug/dL

## 2017-08-04 LAB — PROCALCITONIN: Procalcitonin: 0.97 ng/mL

## 2017-08-04 LAB — TROPONIN I: Troponin I: 0.66 ng/mL (ref <0.03)

## 2017-08-04 MED ORDER — SODIUM CHLORIDE 0.9 % IV BOLUS (SEPSIS): 1000.0000 mL | Freq: Once | INTRAVENOUS | Status: DC

## 2017-08-04 MED ORDER — EPINEPHRINE PF 1 MG/10ML IJ SOSY
PREFILLED_SYRINGE | INTRAMUSCULAR | Status: AC
  Administered 2017-08-04: 1 mg
  Filled 2017-08-04: qty 10

## 2017-08-04 MED ORDER — DEXTROSE 50 % IV SOLN
1.0000 | Freq: Once | INTRAVENOUS | Status: AC
  Administered 2017-08-04: 50 mL via INTRAVENOUS

## 2017-08-04 MED ORDER — VASOPRESSIN 20 UNIT/ML IV SOLN
0.0300 [IU]/min | INTRAVENOUS | Status: DC
  Administered 2017-08-04: 0.03 [IU]/min via INTRAVENOUS
  Filled 2017-08-04: qty 2

## 2017-08-04 MED ORDER — STERILE WATER FOR INJECTION IV SOLN
INTRAVENOUS | Status: DC
  Administered 2017-08-04: 02:00:00 via INTRAVENOUS
  Filled 2017-08-04: qty 850

## 2017-08-04 MED ORDER — SODIUM BICARBONATE 8.4 % IV SOLN
INTRAVENOUS | Status: AC
  Filled 2017-08-04: qty 100

## 2017-08-04 MED ORDER — SODIUM CHLORIDE 0.9 % IV SOLN
2.0000 g | Freq: Once | INTRAVENOUS | Status: AC
  Administered 2017-08-04: 2 g via INTRAVENOUS
  Filled 2017-08-04: qty 20

## 2017-08-04 MED ORDER — SODIUM BICARBONATE 8.4 % IV SOLN
100.0000 meq | Freq: Once | INTRAVENOUS | Status: AC
  Administered 2017-08-04: 100 meq via INTRAVENOUS

## 2017-08-04 MED ORDER — SODIUM CHLORIDE 0.9 % IV BOLUS (SEPSIS): 500.0000 mL | Freq: Once | INTRAVENOUS | Status: DC

## 2017-08-04 MED ORDER — DEXTROSE 50 % IV SOLN
INTRAVENOUS | Status: AC
  Filled 2017-08-04: qty 50

## 2017-08-04 MED ORDER — SODIUM BICARBONATE 8.4 % IV SOLN
200.0000 meq | Freq: Once | INTRAVENOUS | Status: AC
  Administered 2017-08-04: 200 meq via INTRAVENOUS

## 2017-08-04 MED ORDER — EPINEPHRINE PF 1 MG/ML IJ SOLN
0.5000 ug/min | INTRAVENOUS | Status: DC
  Administered 2017-08-04: 0.5 ug/min via INTRAVENOUS
  Filled 2017-08-04: qty 4

## 2017-08-04 MED ORDER — SODIUM CHLORIDE 0.9% IV SOLUTION: Freq: Once | INTRAVENOUS | Status: DC

## 2017-08-04 MED ORDER — INSULIN ASPART 100 UNIT/ML IV SOLN
10.0000 [IU] | Freq: Once | INTRAVENOUS | Status: AC
  Administered 2017-08-04: 10 [IU] via INTRAVENOUS

## 2017-08-04 MED ORDER — EPINEPHRINE PF 1 MG/10ML IJ SOSY
PREFILLED_SYRINGE | INTRAMUSCULAR | Status: AC
  Filled 2017-08-04: qty 20

## 2017-08-04 MED ORDER — SODIUM CHLORIDE 0.9 % IV BOLUS
1000.0000 mL | Freq: Once | INTRAVENOUS | Status: AC
  Administered 2017-08-04: 1000 mL via INTRAVENOUS

## 2017-08-04 MED ORDER — SODIUM BICARBONATE 8.4 % IV SOLN
INTRAVENOUS | Status: AC
  Filled 2017-08-04: qty 50

## 2017-08-04 MED ORDER — PIPERACILLIN-TAZOBACTAM IN DEX 2-0.25 GM/50ML IV SOLN
2.2500 g | Freq: Four times a day (QID) | INTRAVENOUS | Status: DC
  Filled 2017-08-04 (×2): qty 50

## 2017-08-04 MED ORDER — HYDROCORTISONE NA SUCCINATE PF 100 MG IJ SOLR: 50.0000 mg | Freq: Four times a day (QID) | INTRAMUSCULAR | Status: DC

## 2017-08-05 ENCOUNTER — Inpatient Hospital Stay: Payer: Medicare HMO

## 2017-08-05 LAB — TYPE AND SCREEN
ABO/RH(D): O POS
ANTIBODY SCREEN: NEGATIVE
UNIT DIVISION: 0
UNIT DIVISION: 0

## 2017-08-05 LAB — BPAM RBC
Blood Product Expiration Date: 201907072359
Blood Product Expiration Date: 201907072359
ISSUE DATE / TIME: 201906180846
ISSUE DATE / TIME: 201906220421
Unit Type and Rh: 5100
Unit Type and Rh: 5100

## 2017-08-05 LAB — LEGIONELLA PNEUMOPHILA SEROGP 1 UR AG: L. pneumophila Serogp 1 Ur Ag: NEGATIVE

## 2017-08-06 LAB — CULTURE, BLOOD (ROUTINE X 2)
Culture: NO GROWTH
Culture: NO GROWTH

## 2017-08-10 ENCOUNTER — Telehealth: Payer: Self-pay

## 2017-08-10 NOTE — Telephone Encounter (Signed)
On 08/10/17 I received a d/c from [facility] [employer] (original). The d/c is for cremation. The patient is a patient of Doctor Sood. The d/c will be taken to [facility] for signature.  On 08/10/17 I received the d/c back from [facility] [place].  I got the d/c ready and called the funeral home to let them know the d/c is ready for pickup.  I also faxed a copy to the funeral home per the funeral home request.

## 2017-08-10 NOTE — Progress Notes (Signed)
Called to pt's room as pt was rapidly declining and family was bedside. Pt's children, grandchildren and other family members were bedside when I arrived. Her daughter [name] [name] was very emotional and needed assistance throughout the visit and during CPR. After pt passed she was still tearful but was able to maintain composure. Other family members were appropriately tearful, some sobbing at times. Family spent time at bedside of pt. Chaplain provided pastoral/spiritual care, empathic presence, and hydration as needed.  [place], [place]  [phone]   Aug 22, 2017 0400  Clinical Encounter Type  Visited With Family

## 2017-08-10 NOTE — Progress Notes (Signed)
[place] Progress Note Patient Name: [name] [name] DOB: Jul 02, 1933 MRN: [id number]   Date of Service  Aug 28, 2017  HPI/Events of Note  Repeat pH = 7.0. BP = 52/43 again after a brief rally to 9-100 post bicarb and Epinephrine IV.   eICU Interventions  Will order: 1. Sodium bicarbonate 200 mg IV now.         Sommer,Steven Eugene 2017/08/28, 2:21 AM

## 2017-08-10 NOTE — Progress Notes (Addendum)
CRITICAL VALUE ALERT  Critical Value:  K 7, Calcium 5.5  Date & Time Notied:  0258 08-09-17  Provider Notified: Dr. Oletta Darter  Orders Received/Actions taken: Order received from Dr. Oletta Darter for 10U Novolog Insulin IV, 1 amp D50 IV, and 2 gm calcium gluconate IV. Dr. Phylliss Bob at bedside.

## 2017-08-10 NOTE — Progress Notes (Signed)
[place] Progress Note Patient Name: [name] [name] DOB: August 04, 1933 MRN: [id number]   Date of Service  08-28-17  HPI/Events of Note  ABG on 80%/PRVC 25/TV 400/P 10 = 6.9/50/93.  eICU Interventions  Will order: 1. NaHCO3 200 meq IV now. 2. NaHCO3 IV infusion to run IV at 125 meq/hour.  3. ABG at 5 AM.      Intervention Category Major Interventions: Acid-Base disturbance - evaluation and management;Respiratory failure - evaluation and management  Carson Bogden Eugene 08/28/2017, 1:37 AM

## 2017-08-10 NOTE — Progress Notes (Signed)
[place] Progress Note Patient Name: [name] [name] DOB: 1933/12/30 MRN: [id number]   Date of Service  08/15/17  HPI/Events of Note  Hypotension - BP = 50/14. No CVL or CVP. Last pH yesterday morning = 7.25. Currently on Norepinephrine and Phenylephrine IV infusions at ceiling doses.  eICU Interventions  Will order: 1. Increase ceiling on Norepinephrine IV infusion to 70 mcg/min. 2. ABG STAT. 3. Vasopressin IV infusion at shock dose.      Intervention Category Major Interventions: Hypotension - evaluation and management  Fonda Rochon Eugene 08/15/2017, 1:18 AM

## 2017-08-10 NOTE — Progress Notes (Signed)
I was called by [facility] regarding clinical deterioration in [name] [name]. I responded emergently to the bedside where I found patient to be in severe septic shock, BP 47/41 on ALINE despite Levo @ 65mcg, Epi @ 21mcg, Neo @ 400, and Vaso 0.03. Lungs CTA b/l, Abd obese distended, OG tube to suction, Patient unresponsive, cool extremities to touch, Chest port in place, Radial Aline. ABG showed severe acidosis of combined metabolic and respiratory origin. Patient already on broad spectrum antibiotics and has had 7 amp Bicarb IV push as well as Bicarb gtt started by [facility] MD. Increased RR on vent from 28 to 35. Changed Solumedrol to hydrocortisone. Gave 30cc/kg IVF bolus. Multiple family members present including a son, daughter, grand-daughter, and multiple other members. I informed them of her current medical condition with severe septic shock and multisystem organ dysfunction. Discussed with them that despite our best efforts she continues to decline and will likely not survive the night. At this time the family is understandably emotional and in shock. They keep saying that the patient's wishes were to survive at all costs. They want her to remain FULL CODE at this time. I told them that we respect their decision and will continue to do everything we can for her.   60 minutes critical care time  Vernie Murders, MD Pulmonary & Critical Care Medicine Pager: [phone]

## 2017-08-10 NOTE — Progress Notes (Signed)
Kennis Carina, RN and Worthy Keeler, RN listened independently for 2 minutes for heart tones. None were auscultated. CPR was stopped per family request. MD at bedside. Medications were stopped and patient was extubated per MD orders.

## 2017-08-10 NOTE — Progress Notes (Signed)
RN informs me that Hgb is 5; this critical value has not been uploaded into EMR yet by lab. Have ordered 2u pRBC

## 2017-08-10 NOTE — Progress Notes (Signed)
CRITICAL VALUE ALERT  Critical Value:  Hemoglobin 5.1  Date & Time Notied:  08/05/2017 0325  Provider Notified: Dr. Jimmey Ralph  Orders Received/Actions taken: MD made aware

## 2017-08-10 NOTE — Progress Notes (Signed)
[place] Progress Note Patient Name: [name] [name] DOB: [birth year] MRN: [id number]   Date of Service  08-10-17  HPI/Events of Note  Hyperkalemia - K+ = 7.0 and Hypocalemia - Ca++ = 5.5.  eICU Interventions  Will order: 1. D50 1 amp IV now. 2. Novolog Insulin 10 units IV now. 3. Calcium gluconate 2 gm IV now.      Intervention Category Major Interventions: Electrolyte abnormality - evaluation and management  Sommer,Steven Eugene 08/10/2017, 3:00 AM

## 2017-08-10 NOTE — Death Summary Note (Signed)
84 yo female former smoker admitted on 08/09/2017 with dyspnea, and hypoxia from pneumonia.  She had dx of diffuse large B cell lymphoma in April 2019 and started on CHOP.  She was found to have chemotherapy induced pancytopenia.  She was intubated and required pressor agents for septic shock.  She was started on broad spectrum antibiotics.  She was started on bronchodilators and systemic steroids for COPD exacerbation.  She developed progressive shock and lactic acidosis.  This was associated with multiorgan failure.  She developed bradycardia and then asystole.  Resuscitative efforts were unsuccessful.  She expired on 08/28/17 at 3:45 AM.  Cause of death: HCAP with septic shock in setting of chemotherapy induced pancytopenia with hx of diffuse large B cell lymphoma.  Final diagnoses: Acute on chronic hypoxic, hypercapnic respiratory failure COPD with emphysema Anion gap metabolic acidosis with lactic acidosis Hx of NSCLC March 2016 s/p Lt upper lobectomy Diabetes mellitus with steroid induced hyperglycemia Acute renal failure with ATN Gout Acute metabolic encephalopathy Hyperkalemia Hypocalcemia Asystolic cardiac arrest  Chesley Mires, MD [place] 28-Aug-2017, 7:29 AM

## 2017-08-10 NOTE — Progress Notes (Signed)
Late Entry- prior to coding the patient, verbal orders from Dr. Oletta Darter for 2 amps (174mEq total) of Bicarb and 2 ams of epinephrine push were received.  1 amp of epinephrine given 0254 1 amp of sodium bicarbonate given 0258 1 amp of sodium bicarbonate given 0259 1 amp of epinephrine given 0258

## 2017-08-10 NOTE — Progress Notes (Signed)
Pharmacy Antibiotic Note  [name] [name] is a 84 y.o. female admitted on 07/24/2017 with pneumonia.  Pharmacy has been consulted for Vancomycin, cefepime dosing.  Today, 08/20/17 Day #4 antibiotics Renal :SCr worsening -up to 2.57 Plan:  DC scheduled doses of vancomycin- f/u with AM pharmacist  Decrease zosyn to 2.25 gm IV q6h  F/u renal function, WBC, temp, culture data  Height: 5\' 1"  (154.9 cm) Weight: 227 lb 1.2 oz (103 kg) IBW/kg (Calculated) : 47.8  Temp (24hrs), Avg:99.9 F (37.7 C), Min:99.3 F (37.4 C), Max:101.7 F (38.7 C)  Recent Labs  Lab 07/29/17 1011 08/02/2017 2017 07/11/2017 2047 07/29/2017 2230 08/01/17 0900 08/02/17 0017 08/02/17 0138 08/02/17 0226 08/02/17 0438 08/03/17 0330 20-Aug-2017 0158  WBC 0.7* 4.1  --   --   --  15.4*  --  14.6*  --  17.8*  --   CREATININE 0.90 1.17*  --   --   --  1.19*  --  1.31*  --  1.80* 2.57*  LATICACIDVEN  --   --  4.40* 6.89* 2.8*  --  3.2*  --  1.9  --   --     Estimated Creatinine Clearance: 18.9 mL/min (A) (by C-G formula based on SCr of 2.57 mg/dL (H)).    Allergies  Allergen Reactions  . Lipitor [Atorvastatin] Other (See Comments)    myalgia's  . Darvon [Propoxyphene Hcl]     UNSPECIFIED REACTION     Antimicrobials this admission:  6/21 Vancomycin  >> 6/22, 6/23 >> 6/21 Cefepime  >> 6/22 6/22 Rocephin >> 6/23 6/22 Azithromycin >> 6/23 Zosyn >>  Dose adjustments this admission:   Microbiology results:  6/21 BCx: NGTD 6/21 UCx: 10K K. Pneumoniae (R amp, I NTF) 6/21 U/A: dirty 6/22 MRSA PCR: positive Legionella/Strep pneumo: not collected 6/22 trach aspirate: pending 6/23 resp panel: neg  Thank you for allowing pharmacy to be a part of this patient's care.  Eudelia Bunch, Pharm.D. [phone] 08-20-17 3:28 AM

## 2017-08-10 NOTE — Progress Notes (Signed)
Patient became bradycardic and then lost a pulse, going in to asystole. CPR was initiated. Multiple pushes of epi and bicarb given. 1gram calcium chloride IV given. This in addition to her 4 vasopressor infusions and IVF boluses. See RN code sheet for further details. CPR continued for 15 minutes, during which time she never regained a pulse. At 3:45am patient's daughter told me that she would like [name] to stop compressors. CPR was stopped. Time of death: 3:45am. Family declined an autopsy. I expressed sympathy to the family. Chaplain present as well.

## 2017-08-10 DEATH — deceased

## 2017-08-12 ENCOUNTER — Other Ambulatory Visit: Payer: Medicare HMO

## 2017-08-12 ENCOUNTER — Ambulatory Visit: Payer: Medicare HMO | Admitting: Internal Medicine

## 2017-08-12 ENCOUNTER — Ambulatory Visit: Payer: Medicare HMO

## 2017-08-14 ENCOUNTER — Ambulatory Visit: Payer: Medicare HMO

## 2017-08-19 ENCOUNTER — Other Ambulatory Visit: Payer: Medicare HMO

## 2017-08-26 ENCOUNTER — Other Ambulatory Visit: Payer: Medicare HMO

## 2017-09-02 ENCOUNTER — Ambulatory Visit: Payer: Medicare HMO

## 2017-09-02 ENCOUNTER — Other Ambulatory Visit: Payer: Medicare HMO

## 2017-09-02 ENCOUNTER — Ambulatory Visit: Payer: Medicare HMO | Admitting: Nurse Practitioner

## 2017-09-04 ENCOUNTER — Ambulatory Visit: Payer: Medicare HMO

## 2017-09-09 ENCOUNTER — Other Ambulatory Visit: Payer: Medicare HMO

## 2017-09-16 ENCOUNTER — Other Ambulatory Visit: Payer: Medicare HMO

## 2017-09-23 ENCOUNTER — Ambulatory Visit: Payer: Medicare HMO

## 2017-09-23 ENCOUNTER — Other Ambulatory Visit: Payer: Medicare HMO

## 2017-09-23 ENCOUNTER — Ambulatory Visit: Payer: Medicare HMO | Admitting: Adult Health

## 2017-09-25 ENCOUNTER — Ambulatory Visit: Payer: Medicare HMO

## 2018-12-21 IMAGING — CR DG CHEST 2V
2 series · 2 of 2 positions shown · non-contrast
Comparison: 05/11/2017 PET-CT.  01/07/2017 chest radiograph

CLINICAL DATA: Lymphoma.  Preoperative.

EXAM:
CHEST - 2 VIEW

[w chest pa]
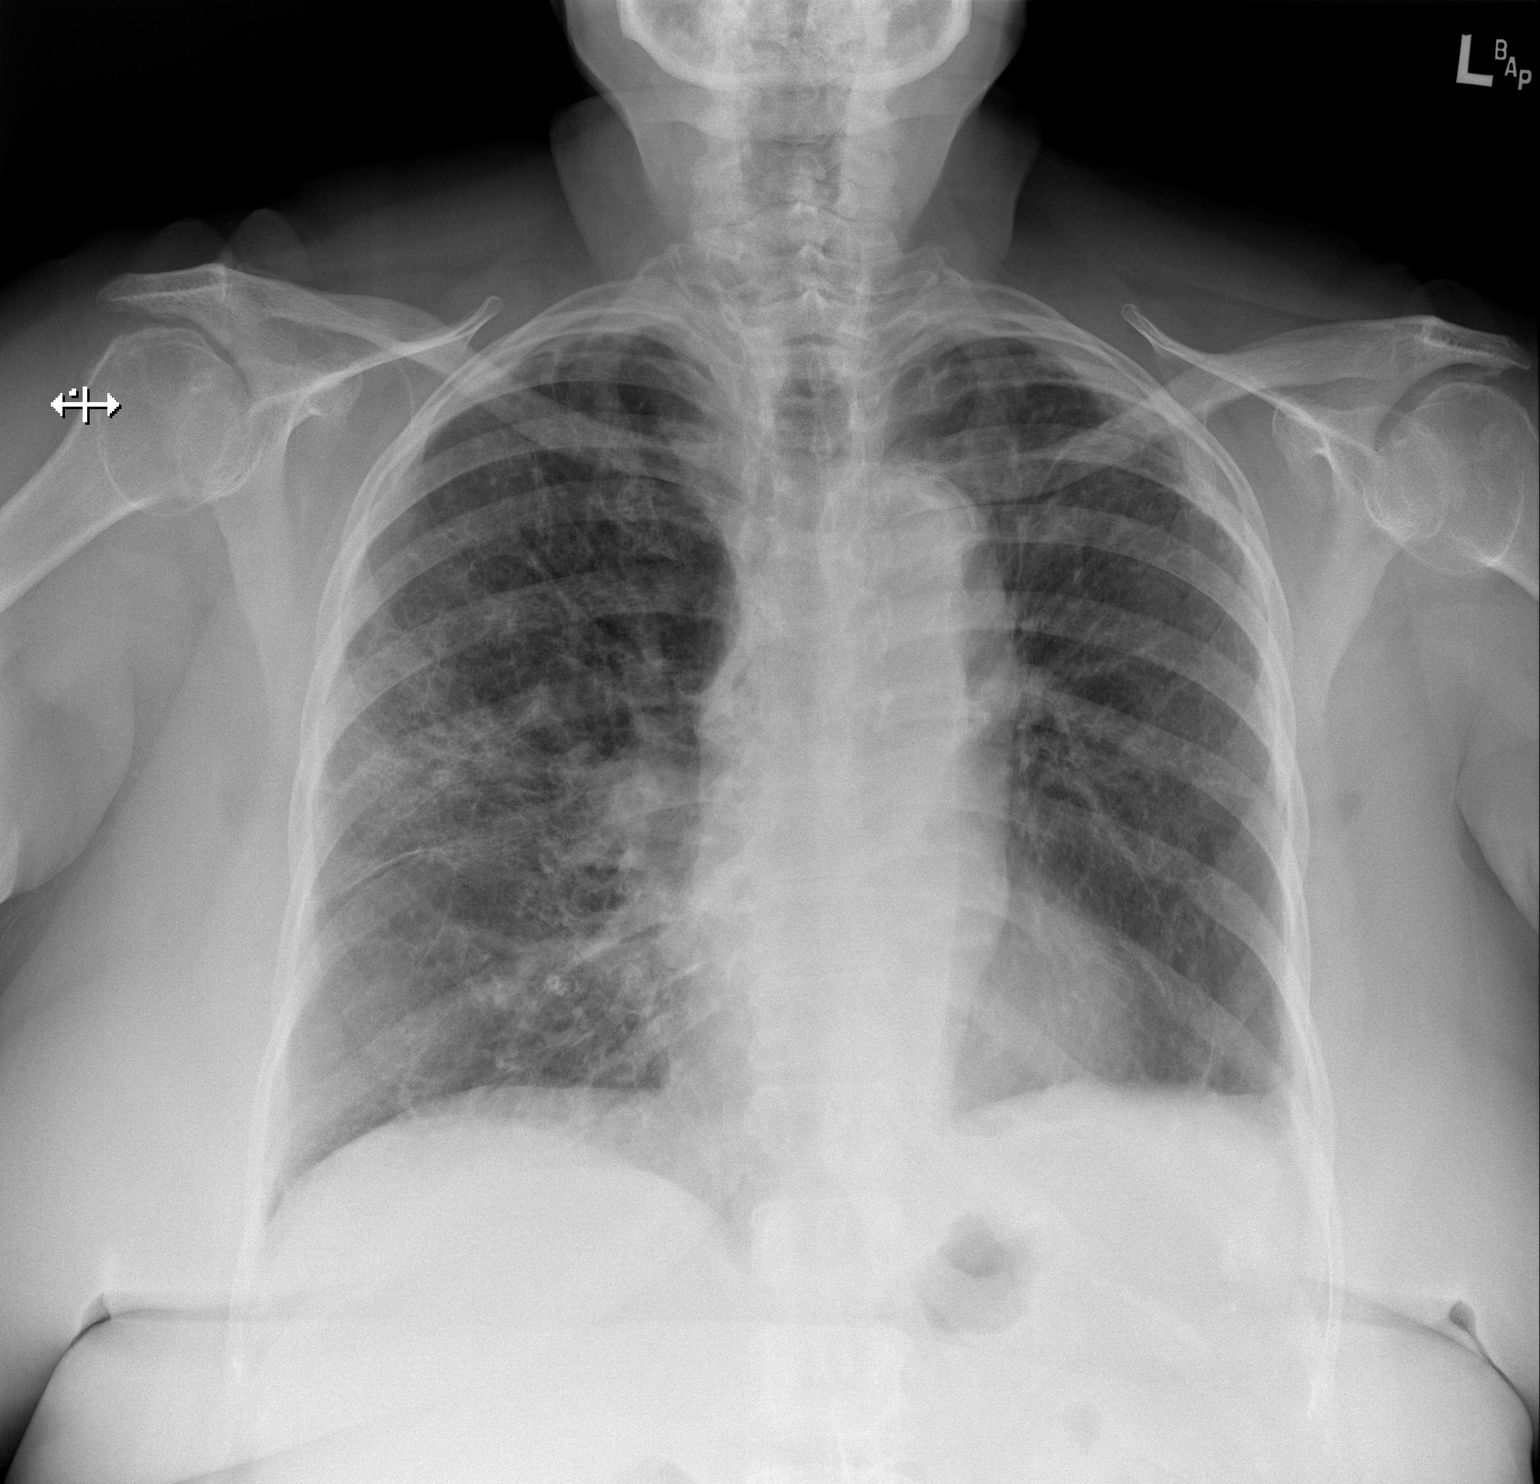

[w chest lat]
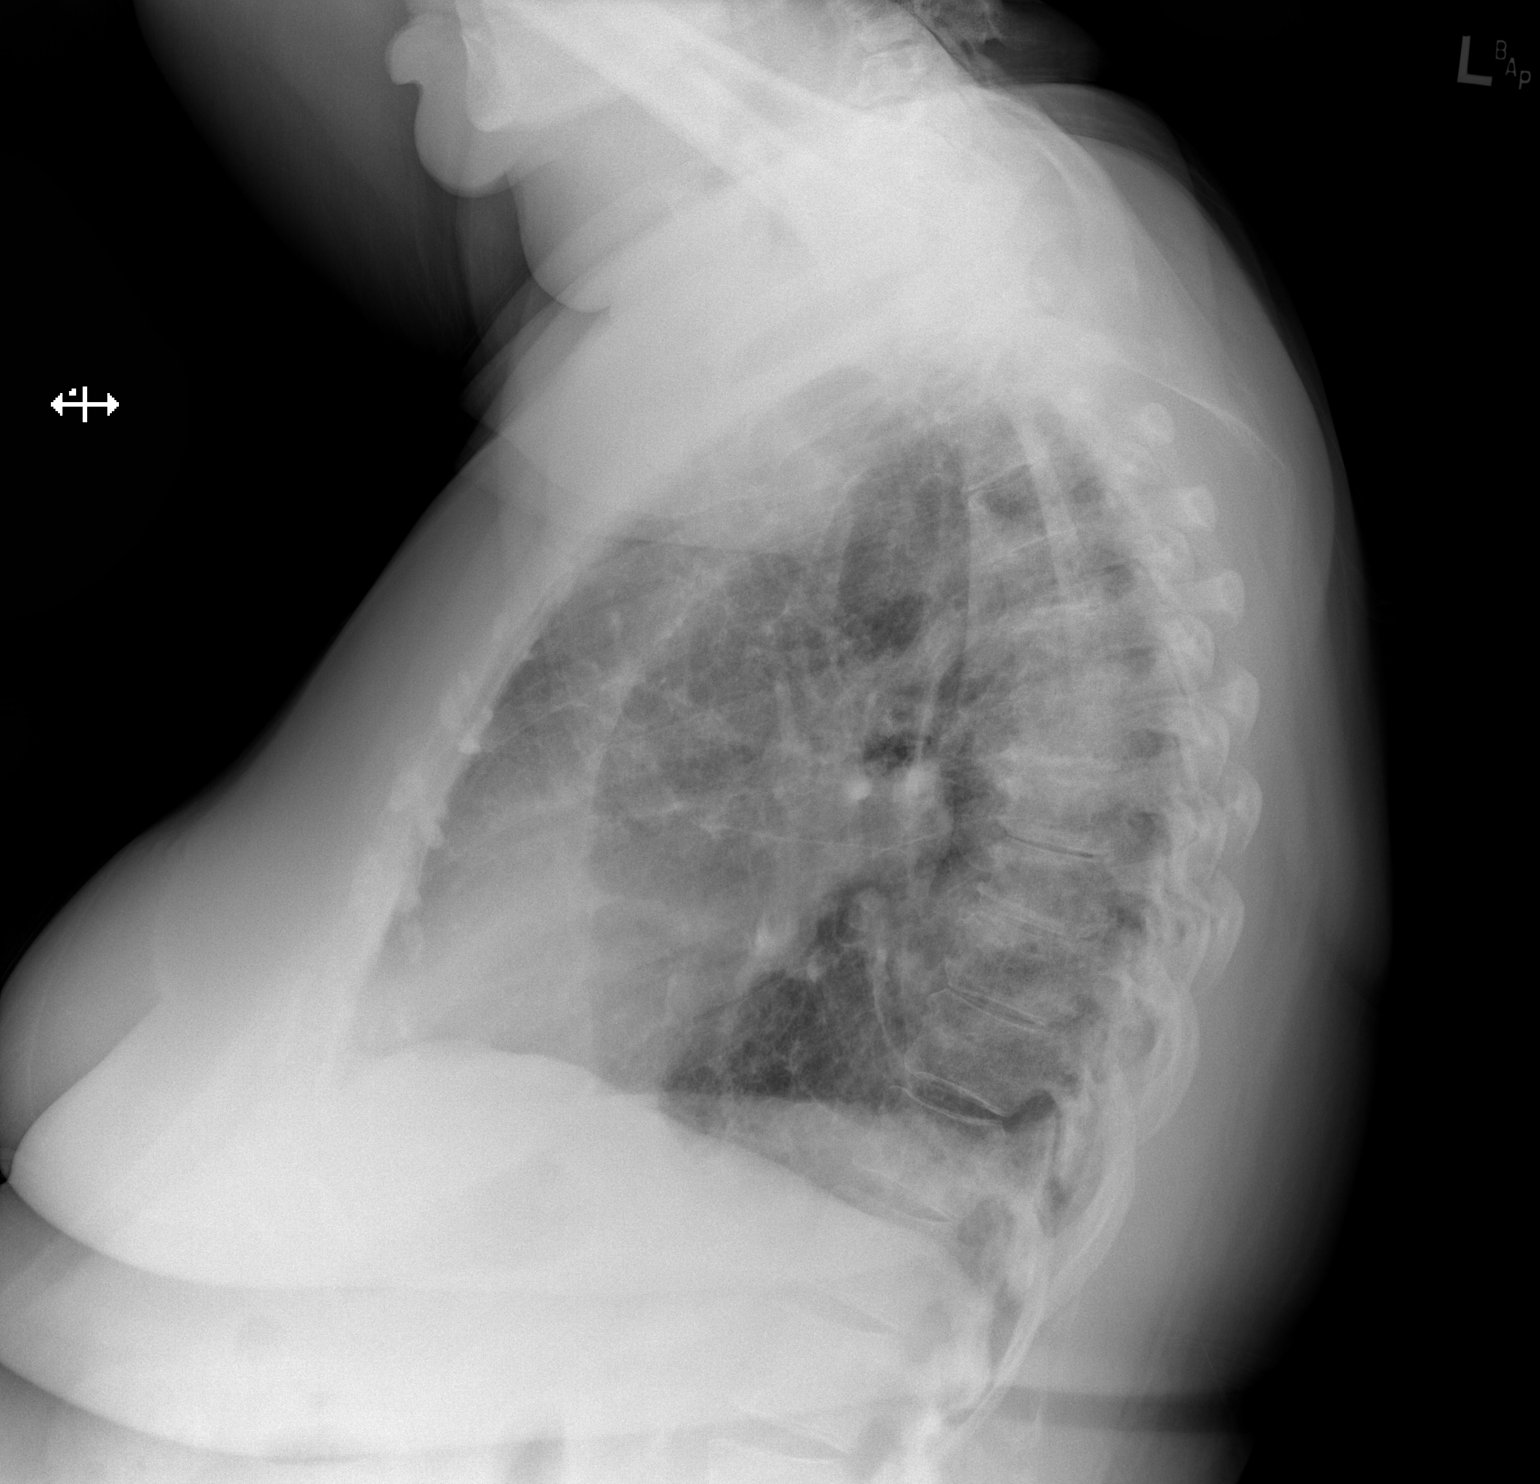

[2 of 2 positions shown; findings below may reference images not displayed]

FINDINGS: Stable cardiomediastinal silhouette with normal heart size. No
pneumothorax. Stable left costophrenic angle pleural-parenchymal
scarring. No pleural effusions. No pulmonary edema. Asymmetric
prominence of the right hilum, increased since 01/07/2017. Hazy
patchy right midlung opacity appears chronic. No acute consolidative
airspace disease.
IMPRESSION: 1. Asymmetric prominence of the right hilum correlating with known
hypermetabolic right hilar adenopathy as seen on recent PET-CT
study.
2. No acute cardiopulmonary disease. Stable chronic hazy right mid
lung opacity suggesting scarring. Stable left costophrenic angle
pleural-parenchymal scarring.

## 2019-02-14 IMAGING — DX DG CHEST 1V PORT
1 series · 2 of 2 positions shown · non-contrast
Comparison: CT chest 07/31/2017, radiograph 07/31/2017, 06/08/2016

CLINICAL DATA: ETT placement

EXAM:
PORTABLE CHEST 1 VIEW

[Series 1: chest ap · 0.14mm/px · 2 of 2 slices shown]
[im 1/2]
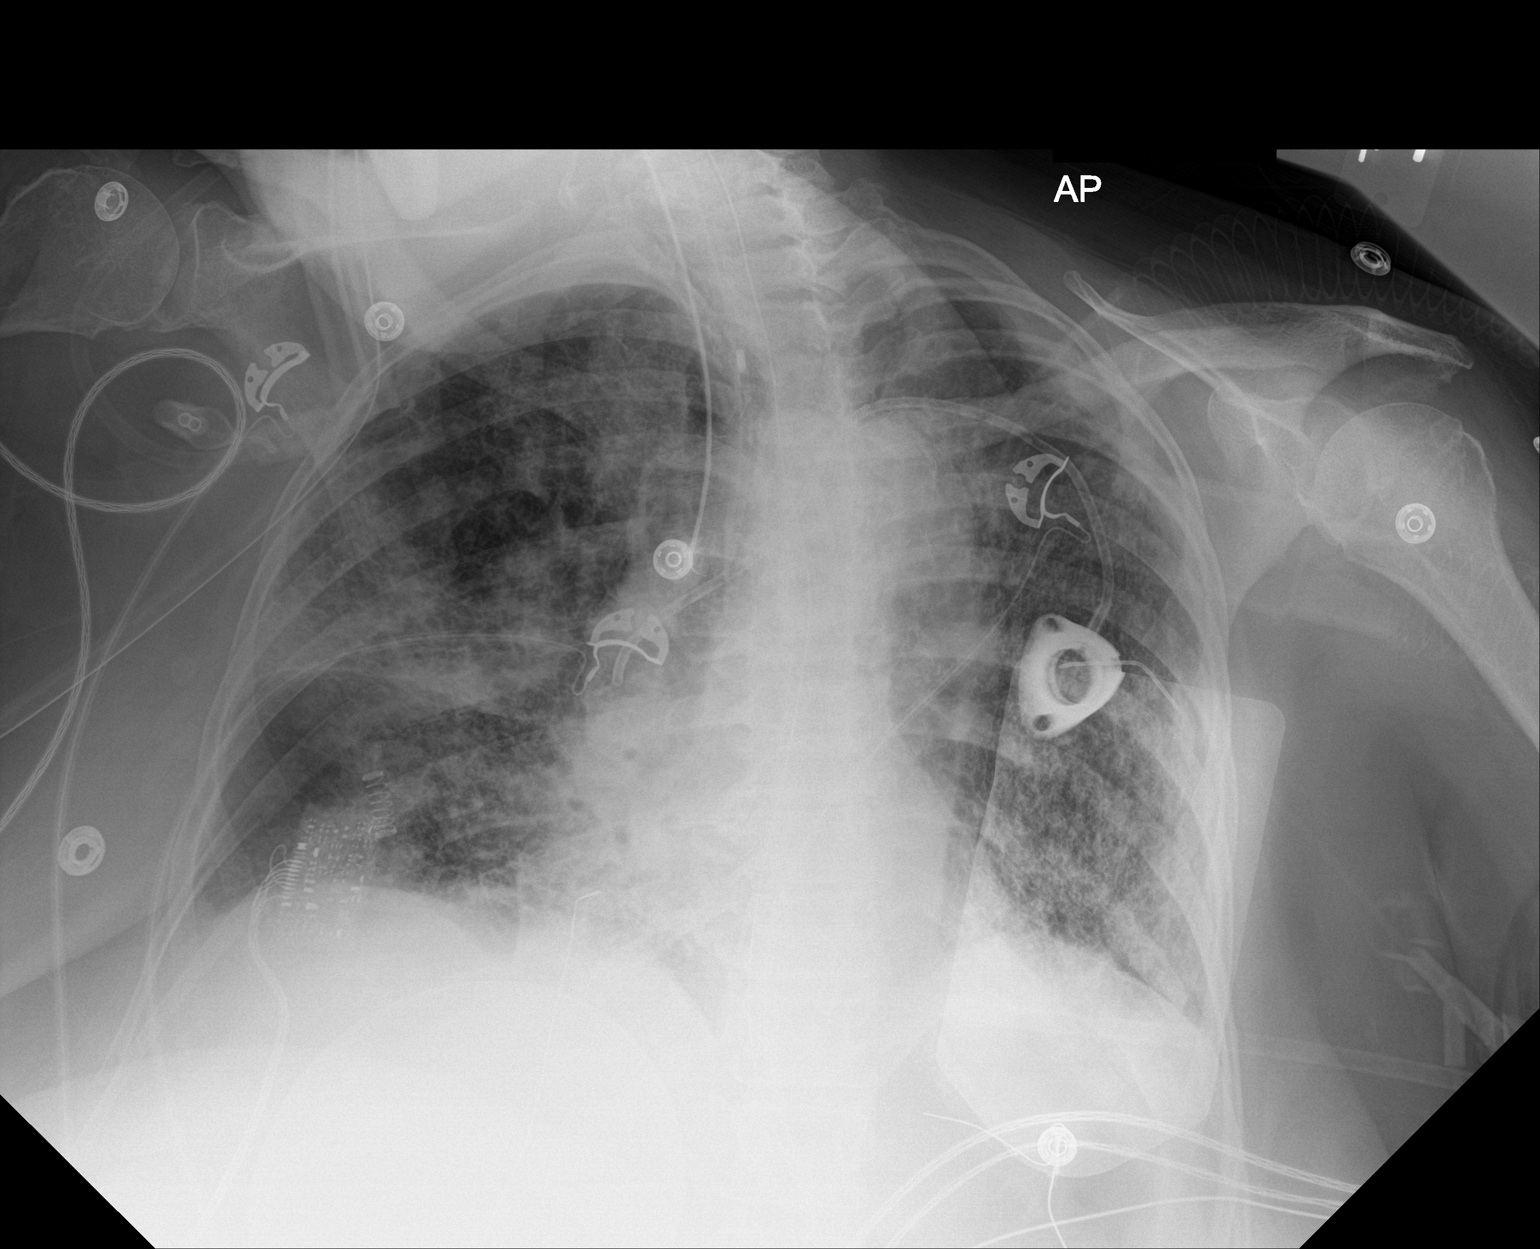
[im 2/2]
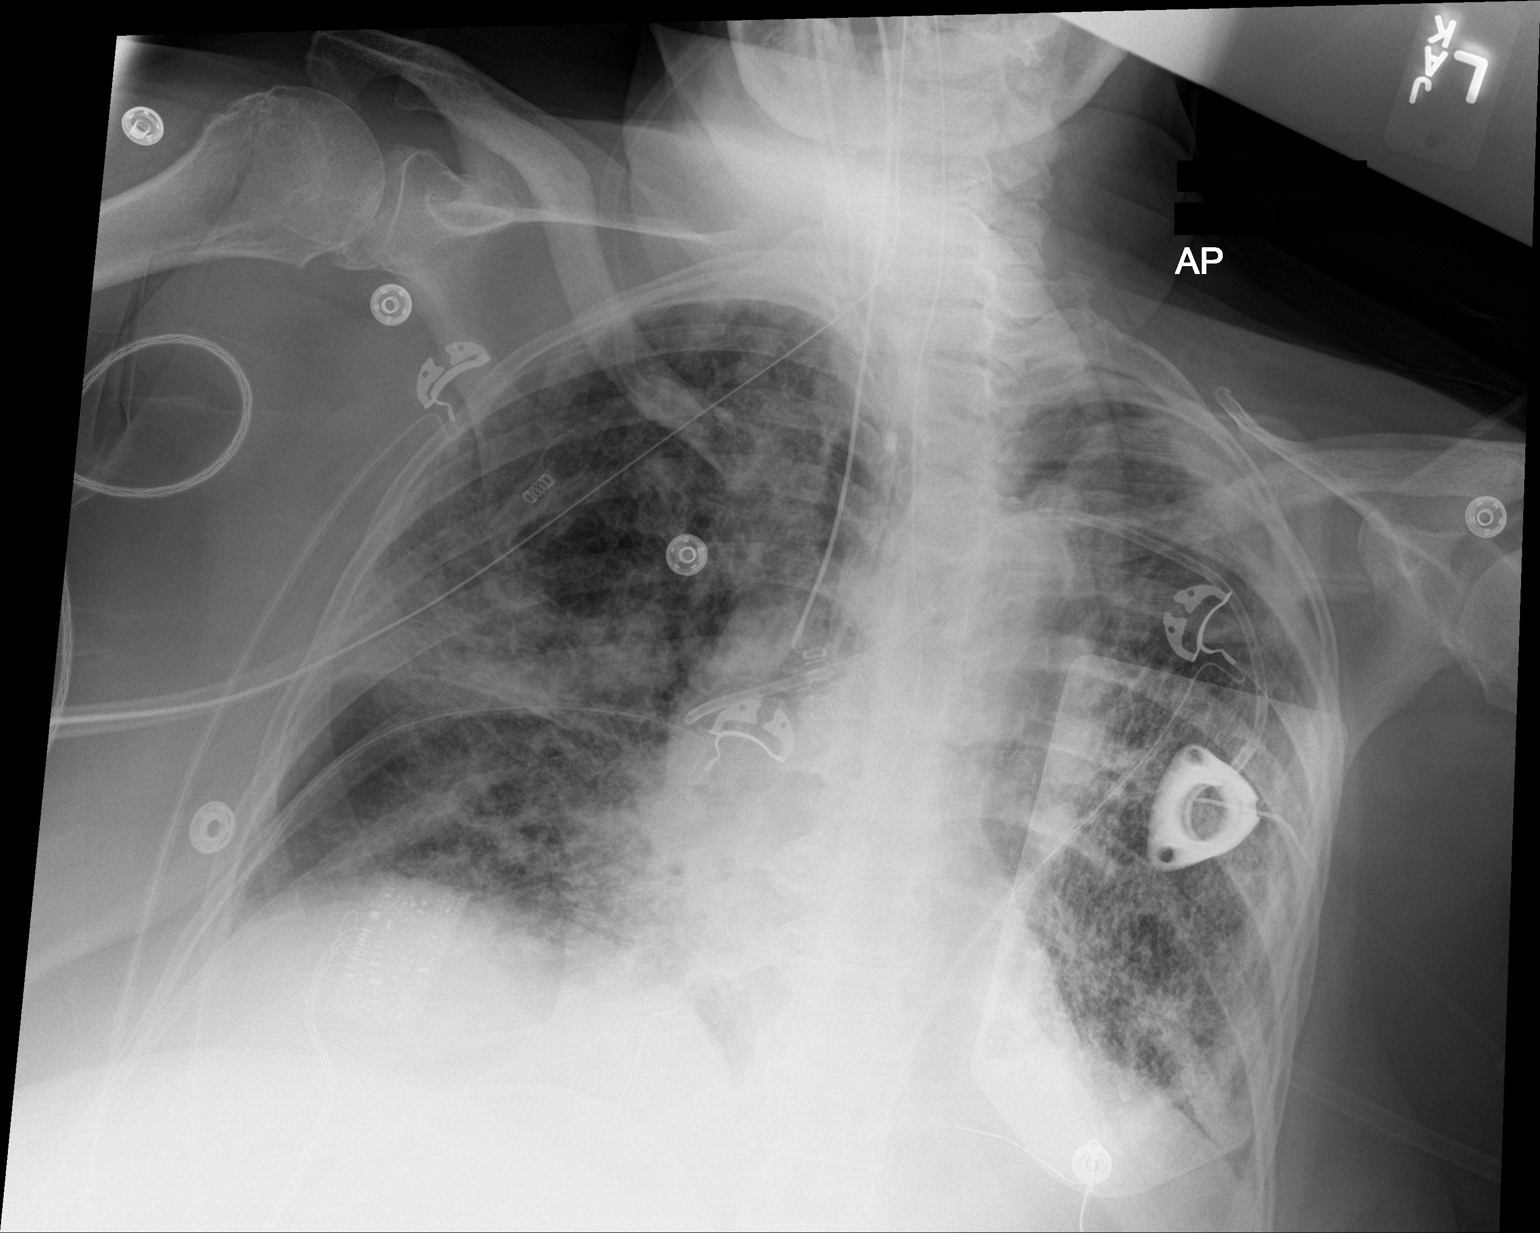

[2 of 2 positions shown; findings below may reference images not displayed]

FINDINGS: Endotracheal tube tip is about 1 cm superior to the carina.
Left-sided central venous port tip overlies the SVC. Esophageal tube
tip below the diaphragm but non included on the image.

Worsened bilateral interstitial and airspace disease within the
bilateral lungs. Stable cardiomediastinal silhouette with aortic
atherosclerosis. No pneumothorax.
IMPRESSION: 1. Endotracheal tube tip about 1 cm superior to the carina.
Esophageal tube tip below the diaphragm but non included on the
image
2. Worsening bilateral interstitial and alveolar disease which may
reflect pulmonary edema or bilateral pneumonia.

## 2019-02-15 IMAGING — DX DG CHEST 1V PORT
1 series · 1 of 1 positions shown · non-contrast
Comparison: August 02, 2017

CLINICAL DATA: Acute respiratory failure.

EXAM:
PORTABLE CHEST 1 VIEW

[chest ap]
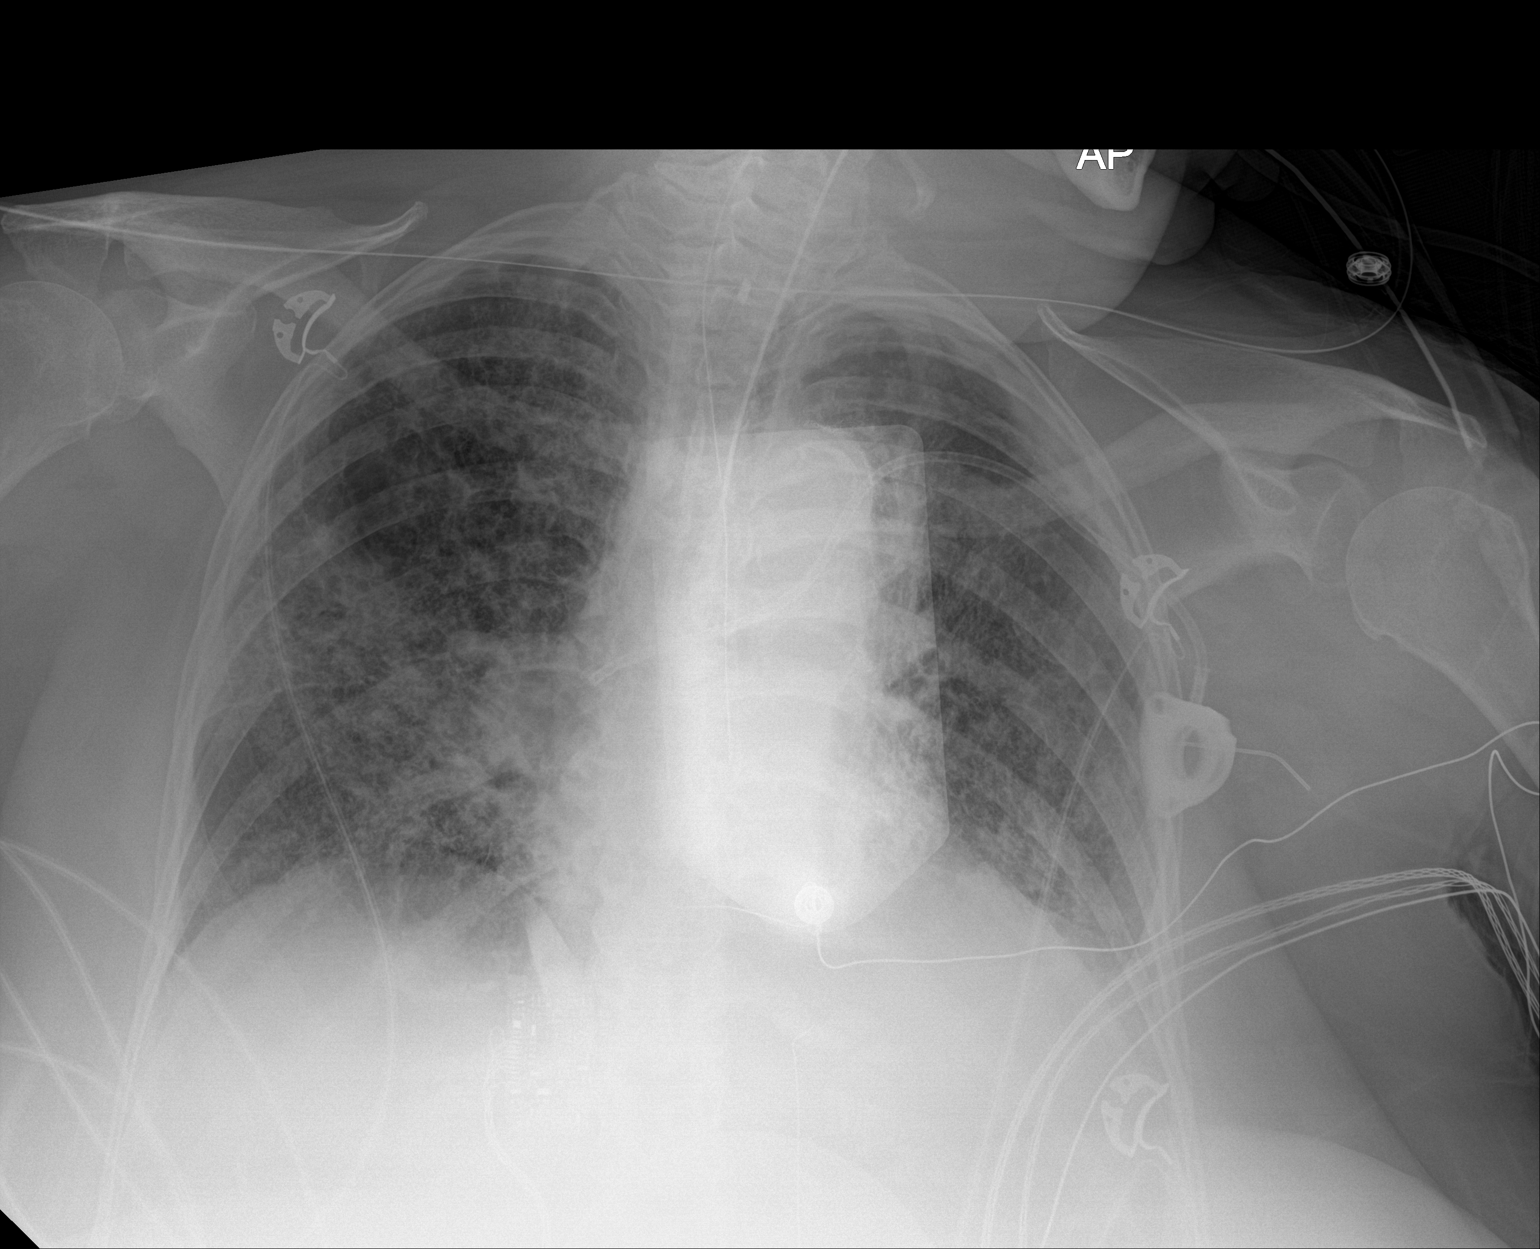

[1 of 1 positions shown; findings below may reference images not displayed]

FINDINGS: The cardiomediastinal silhouette is stable. The ETT is in good
position. The distal tip of the NG tube appears to be 4.9 cm above
the GE junction. Bilateral pulmonary opacities, more focal in the
right base, are stable. No other changes.
IMPRESSION: 1. The distal tip of the NG tube appears to be within the esophagus,
4.9 cm above the GE junction. Recommend repositioning. The ETT is in
good position.
2. Stable bilateral pulmonary opacities, more focal in the right
base.

These results will be called to the ordering clinician or
representative by the Radiologist Assistant, and communication
documented in the PACS or zVision Dashboard.
